# Patient Record
Sex: Male | Born: 1935 | Race: White | Hispanic: No | Smoking: Former smoker
Health system: Southern US, Community
[De-identification: ages and names within clinical notes are randomized; demographics above are authoritative.]

## PROBLEM LIST (undated history)

## (undated) DIAGNOSIS — Z860100 Personal history of colon polyps, unspecified: Secondary | ICD-10-CM

## (undated) DIAGNOSIS — Z8719 Personal history of other diseases of the digestive system: Secondary | ICD-10-CM

## (undated) DIAGNOSIS — B029 Zoster without complications: Secondary | ICD-10-CM

## (undated) DIAGNOSIS — C3490 Malignant neoplasm of unspecified part of unspecified bronchus or lung: Secondary | ICD-10-CM

## (undated) DIAGNOSIS — M720 Palmar fascial fibromatosis [Dupuytren]: Secondary | ICD-10-CM

## (undated) DIAGNOSIS — N400 Enlarged prostate without lower urinary tract symptoms: Secondary | ICD-10-CM

## (undated) DIAGNOSIS — E785 Hyperlipidemia, unspecified: Secondary | ICD-10-CM

## (undated) DIAGNOSIS — L508 Other urticaria: Secondary | ICD-10-CM

## (undated) DIAGNOSIS — L501 Idiopathic urticaria: Secondary | ICD-10-CM

## (undated) DIAGNOSIS — K862 Cyst of pancreas: Secondary | ICD-10-CM

## (undated) DIAGNOSIS — D649 Anemia, unspecified: Secondary | ICD-10-CM

## (undated) DIAGNOSIS — I2510 Atherosclerotic heart disease of native coronary artery without angina pectoris: Secondary | ICD-10-CM

## (undated) DIAGNOSIS — K5790 Diverticulosis of intestine, part unspecified, without perforation or abscess without bleeding: Secondary | ICD-10-CM

## (undated) HISTORY — PX: TONSILLECTOMY: SUR1361

## (undated) HISTORY — DX: Other urticaria: L50.8

## (undated) HISTORY — DX: Cyst of pancreas: K86.2

## (undated) HISTORY — DX: Atherosclerotic heart disease of native coronary artery without angina pectoris: I25.10

## (undated) HISTORY — DX: Personal history of colon polyps, unspecified: Z86.0100

## (undated) HISTORY — DX: Personal history of colonic polyps: Z86.010

---

## 2001-12-14 HISTORY — PX: COLONOSCOPY: SHX174

## 2007-01-29 ENCOUNTER — Ambulatory Visit: Payer: Self-pay | Admitting: Gastroenterology

## 2010-03-26 ENCOUNTER — Ambulatory Visit: Payer: Self-pay | Admitting: Gastroenterology

## 2010-03-26 HISTORY — PX: COLONOSCOPY: SHX174

## 2010-03-28 LAB — PATHOLOGY REPORT

## 2013-05-25 ENCOUNTER — Ambulatory Visit: Payer: Self-pay | Admitting: Gastroenterology

## 2013-05-25 HISTORY — PX: COLONOSCOPY: SHX174

## 2015-05-29 DIAGNOSIS — M720 Palmar fascial fibromatosis [Dupuytren]: Secondary | ICD-10-CM | POA: Insufficient documentation

## 2015-06-29 ENCOUNTER — Other Ambulatory Visit: Payer: Self-pay | Admitting: Internal Medicine

## 2015-06-29 DIAGNOSIS — R0600 Dyspnea, unspecified: Secondary | ICD-10-CM

## 2015-07-03 ENCOUNTER — Ambulatory Visit
Admission: RE | Admit: 2015-07-03 | Discharge: 2015-07-03 | Disposition: A | Discharge status: Home / Self Care | Payer: Commercial Managed Care - HMO | Source: Ambulatory Visit | Attending: Internal Medicine | Admitting: Internal Medicine

## 2015-07-03 DIAGNOSIS — J439 Emphysema, unspecified: Secondary | ICD-10-CM | POA: Insufficient documentation

## 2015-07-03 DIAGNOSIS — I2510 Atherosclerotic heart disease of native coronary artery without angina pectoris: Secondary | ICD-10-CM | POA: Insufficient documentation

## 2015-07-03 DIAGNOSIS — R0600 Dyspnea, unspecified: Secondary | ICD-10-CM

## 2015-07-03 DIAGNOSIS — K449 Diaphragmatic hernia without obstruction or gangrene: Secondary | ICD-10-CM | POA: Diagnosis not present

## 2015-07-03 DIAGNOSIS — J84112 Idiopathic pulmonary fibrosis: Secondary | ICD-10-CM | POA: Diagnosis not present

## 2015-07-03 MED ORDER — IOHEXOL 350 MG/ML SOLN
100.0000 mL | Freq: Once | INTRAVENOUS | Status: AC | PRN
  Administered 2015-07-03: 85 mL via INTRAVENOUS

## 2015-07-05 ENCOUNTER — Other Ambulatory Visit: Payer: Self-pay | Admitting: Internal Medicine

## 2015-07-05 DIAGNOSIS — R599 Enlarged lymph nodes, unspecified: Secondary | ICD-10-CM

## 2015-07-05 DIAGNOSIS — R591 Generalized enlarged lymph nodes: Secondary | ICD-10-CM

## 2015-11-03 ENCOUNTER — Ambulatory Visit
Admission: RE | Admit: 2015-11-03 | Discharge: 2015-11-03 | Disposition: A | Discharge status: Home / Self Care | Payer: Commercial Managed Care - HMO | Source: Ambulatory Visit | Attending: Internal Medicine | Admitting: Internal Medicine

## 2015-11-03 DIAGNOSIS — Z9889 Other specified postprocedural states: Secondary | ICD-10-CM | POA: Insufficient documentation

## 2015-11-03 DIAGNOSIS — R591 Generalized enlarged lymph nodes: Secondary | ICD-10-CM | POA: Insufficient documentation

## 2015-11-03 DIAGNOSIS — J449 Chronic obstructive pulmonary disease, unspecified: Secondary | ICD-10-CM | POA: Insufficient documentation

## 2015-11-03 DIAGNOSIS — J8410 Pulmonary fibrosis, unspecified: Secondary | ICD-10-CM | POA: Diagnosis not present

## 2015-11-03 DIAGNOSIS — R599 Enlarged lymph nodes, unspecified: Secondary | ICD-10-CM

## 2015-11-03 LAB — POCT I-STAT CREATININE: CREATININE: 1 mg/dL (ref 0.61–1.24)

## 2015-11-03 MED ORDER — IOHEXOL 300 MG/ML  SOLN
75.0000 mL | Freq: Once | INTRAMUSCULAR | Status: AC | PRN
  Administered 2015-11-03: 75 mL via INTRAVENOUS

## 2015-12-21 ENCOUNTER — Ambulatory Visit
Admission: RE | Admit: 2015-12-21 | Discharge: 2015-12-21 | Disposition: A | Discharge status: Home / Self Care | Payer: Commercial Managed Care - HMO | Source: Ambulatory Visit | Attending: Internal Medicine | Admitting: Internal Medicine

## 2015-12-21 ENCOUNTER — Other Ambulatory Visit: Payer: Self-pay | Admitting: Internal Medicine

## 2015-12-21 DIAGNOSIS — J8410 Pulmonary fibrosis, unspecified: Secondary | ICD-10-CM | POA: Insufficient documentation

## 2015-12-21 DIAGNOSIS — I2510 Atherosclerotic heart disease of native coronary artery without angina pectoris: Secondary | ICD-10-CM | POA: Diagnosis not present

## 2015-12-21 DIAGNOSIS — J479 Bronchiectasis, uncomplicated: Secondary | ICD-10-CM | POA: Diagnosis not present

## 2015-12-21 DIAGNOSIS — R109 Unspecified abdominal pain: Secondary | ICD-10-CM | POA: Diagnosis present

## 2015-12-21 DIAGNOSIS — I700 Atherosclerosis of aorta: Secondary | ICD-10-CM | POA: Diagnosis not present

## 2015-12-21 DIAGNOSIS — K5790 Diverticulosis of intestine, part unspecified, without perforation or abscess without bleeding: Secondary | ICD-10-CM | POA: Diagnosis not present

## 2015-12-21 DIAGNOSIS — K862 Cyst of pancreas: Secondary | ICD-10-CM | POA: Insufficient documentation

## 2015-12-21 DIAGNOSIS — K449 Diaphragmatic hernia without obstruction or gangrene: Secondary | ICD-10-CM | POA: Diagnosis not present

## 2015-12-21 DIAGNOSIS — J439 Emphysema, unspecified: Secondary | ICD-10-CM | POA: Diagnosis not present

## 2015-12-21 DIAGNOSIS — R1084 Generalized abdominal pain: Secondary | ICD-10-CM | POA: Insufficient documentation

## 2015-12-21 LAB — POCT I-STAT CREATININE: Creatinine, Ser: 1 mg/dL (ref 0.61–1.24)

## 2015-12-21 MED ORDER — IOPAMIDOL (ISOVUE-300) INJECTION 61%
100.0000 mL | Freq: Once | INTRAVENOUS | Status: AC | PRN
  Administered 2015-12-21: 100 mL via INTRAVENOUS

## 2016-01-03 ENCOUNTER — Other Ambulatory Visit: Payer: Self-pay | Admitting: Gastroenterology

## 2016-01-03 DIAGNOSIS — R109 Unspecified abdominal pain: Secondary | ICD-10-CM

## 2016-01-03 DIAGNOSIS — K869 Disease of pancreas, unspecified: Secondary | ICD-10-CM

## 2016-01-22 ENCOUNTER — Ambulatory Visit: Payer: Commercial Managed Care - HMO

## 2016-01-24 ENCOUNTER — Ambulatory Visit: Payer: Commercial Managed Care - HMO

## 2016-02-05 DIAGNOSIS — E538 Deficiency of other specified B group vitamins: Secondary | ICD-10-CM | POA: Insufficient documentation

## 2016-02-09 ENCOUNTER — Ambulatory Visit
Admission: RE | Admit: 2016-02-09 | Discharge: 2016-02-09 | Disposition: A | Discharge status: Home / Self Care | Payer: Commercial Managed Care - HMO | Source: Ambulatory Visit | Attending: Gastroenterology | Admitting: Gastroenterology

## 2016-02-09 DIAGNOSIS — K449 Diaphragmatic hernia without obstruction or gangrene: Secondary | ICD-10-CM | POA: Diagnosis not present

## 2016-02-09 DIAGNOSIS — R109 Unspecified abdominal pain: Secondary | ICD-10-CM

## 2016-02-09 DIAGNOSIS — K869 Disease of pancreas, unspecified: Secondary | ICD-10-CM | POA: Diagnosis not present

## 2016-02-09 DIAGNOSIS — N281 Cyst of kidney, acquired: Secondary | ICD-10-CM | POA: Insufficient documentation

## 2016-02-09 MED ORDER — GADOBENATE DIMEGLUMINE 529 MG/ML IV SOLN
20.0000 mL | Freq: Once | INTRAVENOUS | Status: AC | PRN
  Administered 2016-02-09: 16 mL via INTRAVENOUS

## 2016-02-15 ENCOUNTER — Telehealth: Payer: Self-pay

## 2016-02-15 NOTE — Telephone Encounter (Signed)
  Oncology Nurse Navigator Documentation  Navigator Location: [facility] (02/15/16 1700) Navigator Encounter Type: Introductory phone call;Telephone;Letter/Fax/Email (02/15/16 1700) Telephone: Outgoing Call (02/15/16 1700)             Barriers/Navigation Needs: Coordination of Care (02/15/16 1700)   Interventions: Coordination of Care (02/15/16 1700)   Coordination of Care: EUS (02/15/16 1700)        Acuity: Level 2 (02/15/16 1700)   Acuity Level 2: Initial guidance, education and coordination as needed;Educational needs;Assistance expediting appointments;Ongoing guidance and education throughout treatment as needed (02/15/16 1700)     Time Spent with Patient: 30 (02/15/16 1700)   Received referral from Wynona Dove NP at [facility] for EUS of pancreatic cystic lesion. Scheduled for 02/22/16 with Dr Earlie Counts at [facility]. Spoke with patient on the phone. Denies diabetes or anticoagulation medication other than 1/2 asa daily. Went over instructions and copy also mailed to home address.  INSTRUCTIONS FOR ENDOSCOPIC ULTRASOUND -Your procedure has been scheduled for June 8th with Dr Earlie Counts at [facility] -The hospital will contact you to pre-register over the phone. If for any reason you have not received a call within one week prior to your scheduled procedure date, please call [phone]. -To get your scheduled arrival time, please call the Endoscopy unit at  [phone] between 1-3pm on: June 7th    -ON THE DAY OF YOU PROCEDURE:   1. If you are scheduled for a morning procedure, nothing to drink after midnight  -If you are scheduled for an afternoon procedure, you may have clear liquids until 5 hours prior  to the procedure but no carbonated drinks or broth  2. NO FOOD THE DAY OF YOUR PROCEDURE  3. You may take your heart, seizure, blood pressure, Parkinson's or breathing medications at  6am with just enough water to get your pills down  4. Do not take any oral Diabetic medications the  morning of your procedure.  5. If you are a diabetic and are using insulin, please notify your prescribing physician of this  procedure as your dose may need to be altered related to not being able to eat or drink.   5. Do not take Vitamins     -On the day of your procedure, come to the [facility] Admitting/Registration desk (First desk on the right) at the scheduled arrival time. You MUST have someone drive you home from your procedure. You must have a responsible adult with a valid drivers license who is on site throughout your entire procedure and who can stay with you for several hours after your procedure. You may not go home alone in a taxi, shuttle [place] or bus, as the drivers will not be responsible for you.  --If you have any questions please call me at the above contact

## 2016-02-22 ENCOUNTER — Encounter
Admission: RE | Disposition: A | Discharge status: Home / Self Care | Payer: Self-pay | Source: Ambulatory Visit | Attending: Gastroenterology

## 2016-02-22 ENCOUNTER — Ambulatory Visit
Admission: RE | Admit: 2016-02-22 | Discharge: 2016-02-22 | Disposition: A | Discharge status: Home / Self Care | Payer: Commercial Managed Care - HMO | Source: Ambulatory Visit | Attending: Gastroenterology | Admitting: Gastroenterology

## 2016-02-22 ENCOUNTER — Ambulatory Visit: Payer: Commercial Managed Care - HMO | Admitting: Anesthesiology

## 2016-02-22 ENCOUNTER — Encounter: Payer: Self-pay | Admitting: *Deleted

## 2016-02-22 ENCOUNTER — Telehealth: Payer: Self-pay

## 2016-02-22 DIAGNOSIS — K449 Diaphragmatic hernia without obstruction or gangrene: Secondary | ICD-10-CM | POA: Diagnosis not present

## 2016-02-22 DIAGNOSIS — Z7982 Long term (current) use of aspirin: Secondary | ICD-10-CM | POA: Diagnosis not present

## 2016-02-22 DIAGNOSIS — E785 Hyperlipidemia, unspecified: Secondary | ICD-10-CM | POA: Insufficient documentation

## 2016-02-22 DIAGNOSIS — Z9889 Other specified postprocedural states: Secondary | ICD-10-CM | POA: Insufficient documentation

## 2016-02-22 DIAGNOSIS — L501 Idiopathic urticaria: Secondary | ICD-10-CM | POA: Diagnosis not present

## 2016-02-22 DIAGNOSIS — R933 Abnormal findings on diagnostic imaging of other parts of digestive tract: Secondary | ICD-10-CM | POA: Insufficient documentation

## 2016-02-22 DIAGNOSIS — Z87891 Personal history of nicotine dependence: Secondary | ICD-10-CM | POA: Diagnosis not present

## 2016-02-22 DIAGNOSIS — K862 Cyst of pancreas: Secondary | ICD-10-CM | POA: Insufficient documentation

## 2016-02-22 DIAGNOSIS — N400 Enlarged prostate without lower urinary tract symptoms: Secondary | ICD-10-CM | POA: Insufficient documentation

## 2016-02-22 HISTORY — DX: Zoster without complications: B02.9

## 2016-02-22 HISTORY — DX: Palmar fascial fibromatosis (dupuytren): M72.0

## 2016-02-22 HISTORY — DX: Idiopathic urticaria: L50.1

## 2016-02-22 HISTORY — PX: EUS: SHX5427

## 2016-02-22 HISTORY — DX: Diverticulosis of intestine, part unspecified, without perforation or abscess without bleeding: K57.90

## 2016-02-22 HISTORY — DX: Benign prostatic hyperplasia without lower urinary tract symptoms: N40.0

## 2016-02-22 HISTORY — DX: Anemia, unspecified: D64.9

## 2016-02-22 HISTORY — DX: Hyperlipidemia, unspecified: E78.5

## 2016-02-22 SURGERY — UPPER ENDOSCOPIC ULTRASOUND (EUS) LINEAR
Anesthesia: General

## 2016-02-22 MED ORDER — PROPOFOL 10 MG/ML IV BOLUS
INTRAVENOUS | Status: DC | PRN
  Administered 2016-02-22: 30 mg via INTRAVENOUS

## 2016-02-22 MED ORDER — SODIUM CHLORIDE 0.9 % IV SOLN
INTRAVENOUS | Status: DC
  Administered 2016-02-22: 13:00:00 via INTRAVENOUS

## 2016-02-22 MED ORDER — PROPOFOL 500 MG/50ML IV EMUL
INTRAVENOUS | Status: DC | PRN
  Administered 2016-02-22: 160 ug/kg/min via INTRAVENOUS

## 2016-02-22 MED ORDER — PHENYLEPHRINE HCL 10 MG/ML IJ SOLN
INTRAMUSCULAR | Status: DC | PRN
  Administered 2016-02-22: 100 ug via INTRAVENOUS
  Administered 2016-02-22 (×2): 150 ug via INTRAVENOUS
  Administered 2016-02-22: 50 ug via INTRAVENOUS
  Administered 2016-02-22: 100 ug via INTRAVENOUS

## 2016-02-22 MED ORDER — LIDOCAINE HCL (CARDIAC) 20 MG/ML IV SOLN
INTRAVENOUS | Status: DC | PRN
Start: 2016-02-22 — End: 2016-02-22
  Administered 2016-02-22: 60 mg via INTRAVENOUS

## 2016-02-22 NOTE — Anesthesia Preprocedure Evaluation (Addendum)
Anesthesia Evaluation  Patient identified by MRN, date of birth, ID band Patient awake    Reviewed: Allergy & Precautions, H&P , NPO status , Patient's Chart, lab work & pertinent test results, reviewed documented beta blocker date and time   Airway Mallampati: II   Neck ROM: full    Dental  (+) Teeth Intact   Pulmonary neg pulmonary ROS, former smoker,    Pulmonary exam normal        Cardiovascular negative cardio ROS Normal cardiovascular exam Rate:Normal     Neuro/Psych negative neurological ROS  negative psych ROS   GI/Hepatic negative GI ROS, Neg liver ROS,   Endo/Other  negative endocrine ROS  Renal/GU negative Renal ROS  negative genitourinary   Musculoskeletal   Abdominal   Peds  Hematology negative hematology ROS (+) anemia ,   Anesthesia Other Findings Past Medical History:   Anemia                                                       Chronic idiopathic urticaria                                 Diverticulosis                                               BPH (benign prostatic hyperplasia)                           Dupuytren's contracture of right hand                        Shingles                                                     Hyperlipemia                                               Past Surgical History:   TONSILLECTOMY                                                 COLONOSCOPY                                                   Reproductive/Obstetrics                            Anesthesia Physical Anesthesia Plan  ASA: II  Anesthesia Plan: General   Post-op Pain Management:    Induction:  Airway Management Planned:   Additional Equipment:   Intra-op Plan:   Post-operative Plan:   Informed Consent: I have reviewed the patients History and Physical, chart, labs and discussed the procedure including the risks, benefits and alternatives for the proposed  anesthesia with the patient or authorized representative who has indicated his/her understanding and acceptance.   Dental Advisory Given  Plan Discussed with: CRNA  Anesthesia Plan Comments:        Anesthesia Quick Evaluation

## 2016-02-22 NOTE — Op Note (Signed)
[facility] Gastroenterology Patient Name: Noah Coleman Procedure Date: 02/22/2016 12:48 PM MRN: [id number] Account #: [id number] Date of Birth: [birth year] Admit Type: Outpatient Age: 82 Room: [facility] ENDO ROOM 3 Gender: Male Note Status: Finalized Procedure:            Upper EUS Indications:          Pancreatic cyst on CT scan Providers:            Christian Mate. Earlie Counts, MD Referring MD:         Rusty Aus, MD (Referring MD), Lollie Sails,                        MD (Referring MD) Medicines:            Monitored Anesthesia Care Complications:        No immediate complications. Procedure:            Pre-Anesthesia Assessment:                       - Prior to the procedure, a History and Physical was                        performed, and patient medications, allergies and                        sensitivities were reviewed. The patient's tolerance of                        previous anesthesia was reviewed.                       After obtaining informed consent, the endoscope was                        passed under direct vision. Throughout the procedure,                        the patient's blood pressure, pulse, and oxygen                        saturations were monitored continuously. The                        duodenoscope and subsequently the EUS GI Linear Array                        [id number] was introduced through the mouth, and advanced                        to the second part of duodenum. The upper EUS was                        accomplished without difficulty. The patient tolerated                        the procedure well. Findings:      Endoscopic Finding :      Detailed esophageal exam not performed (side-vieweing instrument).      A medium-sized hiatal hernia was present.      The entire examined stomach was endoscopically normal.  The examined duodenum was endoscopically normal.      The ampulla was normal.      Endosonographic Finding :   An anechoic lesion suggestive of a cyst was identified in the uncinate       process of the pancreas. It communicates with the pancreatic duct by a       thin side branch. The lesion measured 14.6 mm by 12.9 mm in maximal       cross-sectional diameter. There was a single compartment thinly       septated. The outer wall of the lesion was thin. There was no associated       mass. There was no internal debris within the fluid-filled cavity.      The pancreatic duct had a dilated endosonographic appearance in the       pancreatic head. The pancreatic duct measured up to 6.3 mm in diameter       in the head of the pancreas. It tapered to normal size distally: 2.4 mm       in the neck, 1.8 mm in the body, and very thin and poorly visualized in       the tail.      Anechoic lesions suggestive of two cysts were identified in the       pancreatic body and in the pancreatic tail. The largest lesion measured       3.8 mm by 3.8 mm in maximal cross-sectional diameter. There was no       associated mass. No obvious communication was seen between these       diminutive cysts and the pancreatic duct.      Endosonographic imaging in the entire pancreas showed no chronic       pancreatitis and no mass.      Endosonographic imaging in the left lobe of the liver showed no mass.      No abnormal-appearing lymph nodes were seen during endosonographic       examination in the celiac region (level 20). Impression:           - A cystic lesion was seen in the uncinate process of                        the pancreas. Tissue has not been obtained, given its                        small size and lack of worrisome features. 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                       - The pancreatic duct had a dilated endosonographic                        appearance in the pancreatic head.  The pancreatic duct                        measured up to 6.3 mm in diameter in the head.                       -  Two diminutive cystic lesions were seen in the                        pancreatic body and in the pancreatic tail.                       - Medium-sized hiatal hernia.                       - Normal ampulla.                       - No specimens collected. Recommendation:       - Return to referring physician.                       - Perform CT scan (computed tomography)/MRI of the                        abdomen with contrast in 1 year.                       - Patient has a contact number available for                        emergencies. The signs and symptoms of potential                        delayed complications were discussed with the patient.                        Return to normal activities tomorrow. Written discharge                        instructions were provided to the patient. Procedure Code(s):    --- Professional ---                       [phone], Esophagogastroduodenoscopy, flexible, transoral;                        with endoscopic ultrasound examination limited to the                        esophagus, stomach or duodenum, and adjacent structures Diagnosis Code(s):    --- Professional ---                       K86.2, Cyst of pancreas                       R93.3, Abnormal findings on diagnostic imaging of other                        parts of digestive tract                       K44.9, Diaphragmatic hernia without obstruction or                        gangrene CPT copyright 2016 American Medical Association. All rights reserved. The codes documented in this report are preliminary and upon coder review may  be revised to meet current  compliance requirements. Attending Participation:      I personally performed the entire procedure without the assistance of a       fellow, resident or surgical assistant. Lindie Spruce, MD 02/22/2016 1:48:31 PM This report has  been signed electronically. Number of Addenda: 0 Note Initiated On: 02/22/2016 12:48 PM Estimated Blood Loss: Estimated blood loss: none.      [facility]

## 2016-02-22 NOTE — Transfer of Care (Signed)
Immediate Anesthesia Transfer of Care Note  Patient: Noah Coleman  Procedure(s) Performed: Procedure(s): UPPER ENDOSCOPIC ULTRASOUND (EUS) LINEAR (N/A)  Patient Location: PACU  Anesthesia Type:General  Level of Consciousness: awake and patient cooperative  Airway & Oxygen Therapy: Patient Spontanous Breathing and Patient connected to nasal cannula oxygen  Post-op Assessment: Report given to RN  Post vital signs: Reviewed and stable  Last Vitals:  Filed Vitals:   02/22/16 1221 02/22/16 1341  BP: 149/68 91/48  Pulse: 72 70  Temp: 36.8 C 36.1 C  Resp: 16 19    Last Pain: There were no vitals filed for this visit.       Complications: No apparent anesthesia complications

## 2016-02-22 NOTE — Telephone Encounter (Signed)
  Oncology Nurse Navigator Documentation  Navigator Location: [facility] (02/22/16 1500)                         Coordination of Care: EUS (02/22/16 1500)                  Time Spent with Patient: 15 (02/22/16 1500)   Copy of EUS routed to Dr Marton Redwood office.

## 2016-02-22 NOTE — H&P (Signed)
Primary Care Physician:  Rusty Aus, MD  Pre-Procedure History & Physical: HPI:  Noah Coleman is a 82 y.o. male is here for an Upper EUS to evaluate and incidentally noted pancreatic cystic lesion.   Past Medical History  Diagnosis Date  . Anemia   . Chronic idiopathic urticaria   . Diverticulosis   . BPH (benign prostatic hyperplasia)   . Dupuytren's contracture of right hand   . Shingles   . Hyperlipemia     Past Surgical History  Procedure Laterality Date  . Tonsillectomy    . Colonoscopy      Prior to Admission medications   Medication Sig Start Date End Date Taking? Authorizing Provider  aspirin 81 MG tablet Take 81 mg by mouth daily.   Yes Historical Provider, MD  fexofenadine (ALLEGRA) 180 MG tablet Take 180 mg by mouth daily.   Yes Historical Provider, MD  Multiple Vitamin (MULTIVITAMIN) capsule Take 1 capsule by mouth daily.   Yes Historical Provider, MD    Allergies as of 02/19/2016  . (No Known Allergies)    History reviewed. No pertinent family history.  Social History   Social History  . Marital Status: Married    Spouse Name: N/A  . Number of Children: N/A  . Years of Education: N/A   Occupational History  . Not on file.   Social History Main Topics  . Smoking status: Former Research scientist (life sciences)  . Smokeless tobacco: Not on file  . Alcohol Use: No  . Drug Use: No  . Sexual Activity: Not on file   Other Topics Concern  . Not on file   Social History Narrative    Review of Systems: See HPI, otherwise negative ROS  Physical Exam: BP 149/68 mmHg  Pulse 72  Temp(Src) 98.2 F (36.8 C) (Tympanic)  Resp 16  Ht 6' (1.829 m)  Wt 77.111 kg (170 lb)  BMI 23.05 kg/m2  SpO2 100% General:   Alert,  pleasant and cooperative in NAD Head:  Normocephalic and atraumatic. Neck:  Supple; no masses or thyromegaly. Lungs:  Clear throughout to auscultation.    Heart:  Regular rate and rhythm. Abdomen:  Soft, nontender and nondistended. Normal bowel sounds,  without guarding, and without rebound.   Neurologic:  Alert and  oriented x4;  grossly normal neurologically.  Impression/Plan: Noah Coleman is here for an Upper EUS to be performed for pancreatic cyst.  Risks, benefits, limitations, and alternatives regarding  Upper EUS have been reviewed with the patient.  Questions have been answered.  All parties agreeable.   Cora Daniels, MD  02/22/2016, 1:01 PM

## 2016-02-23 ENCOUNTER — Encounter: Payer: Self-pay | Admitting: Gastroenterology

## 2016-03-05 NOTE — Anesthesia Postprocedure Evaluation (Signed)
Anesthesia Post Note  Patient: Noah Coleman  Procedure(s) Performed: Procedure(s) (LRB): UPPER ENDOSCOPIC ULTRASOUND (EUS) LINEAR (N/A)  Patient location during evaluation: PACU Anesthesia Type: General Level of consciousness: awake and alert Pain management: pain level controlled Vital Signs Assessment: post-procedure vital signs reviewed and stable Respiratory status: spontaneous breathing, nonlabored ventilation, respiratory function stable and patient connected to nasal cannula oxygen Cardiovascular status: blood pressure returned to baseline and stable Postop Assessment: no signs of nausea or vomiting Anesthetic complications: no    Last Vitals:  Filed Vitals:   02/22/16 1400 02/22/16 1410  BP: 138/61 119/71  Pulse: 67 68  Temp:    Resp: 16 15    Last Pain:  Filed Vitals:   02/23/16 0752  PainSc: 0-No pain                 Molli Barrows

## 2016-05-30 DIAGNOSIS — L508 Other urticaria: Secondary | ICD-10-CM | POA: Insufficient documentation

## 2016-05-30 DIAGNOSIS — K862 Cyst of pancreas: Secondary | ICD-10-CM | POA: Insufficient documentation

## 2016-05-30 HISTORY — DX: Cyst of pancreas: K86.2

## 2016-05-30 HISTORY — DX: Other urticaria: L50.8

## 2016-07-30 ENCOUNTER — Other Ambulatory Visit: Payer: Self-pay | Admitting: Internal Medicine

## 2016-07-30 DIAGNOSIS — I208 Other forms of angina pectoris: Secondary | ICD-10-CM

## 2016-07-30 DIAGNOSIS — R55 Syncope and collapse: Secondary | ICD-10-CM

## 2016-08-02 ENCOUNTER — Ambulatory Visit
Admission: RE | Admit: 2016-08-02 | Discharge: 2016-08-02 | Disposition: A | Discharge status: Home / Self Care | Payer: Commercial Managed Care - HMO | Source: Ambulatory Visit | Attending: Internal Medicine | Admitting: Internal Medicine

## 2016-08-02 DIAGNOSIS — I208 Other forms of angina pectoris: Secondary | ICD-10-CM | POA: Insufficient documentation

## 2016-08-02 DIAGNOSIS — I6523 Occlusion and stenosis of bilateral carotid arteries: Secondary | ICD-10-CM | POA: Insufficient documentation

## 2016-08-02 DIAGNOSIS — R55 Syncope and collapse: Secondary | ICD-10-CM | POA: Insufficient documentation

## 2016-08-06 ENCOUNTER — Other Ambulatory Visit: Payer: Self-pay | Admitting: Internal Medicine

## 2016-08-06 DIAGNOSIS — I6521 Occlusion and stenosis of right carotid artery: Secondary | ICD-10-CM

## 2016-08-07 ENCOUNTER — Other Ambulatory Visit: Payer: Self-pay | Admitting: Cardiology

## 2016-08-07 DIAGNOSIS — R079 Chest pain, unspecified: Secondary | ICD-10-CM | POA: Insufficient documentation

## 2016-08-07 MED ORDER — SODIUM CHLORIDE 0.9 % WEIGHT BASED INFUSION: 3.0000 mL/kg/h | INTRAVENOUS | Status: AC

## 2016-08-07 MED ORDER — SODIUM CHLORIDE 0.9 % IV SOLN: 250.0000 mL | INTRAVENOUS | Status: AC | PRN

## 2016-08-07 MED ORDER — SODIUM CHLORIDE 0.9 % WEIGHT BASED INFUSION: 1.0000 mL/kg/h | INTRAVENOUS | Status: AC

## 2016-08-07 MED ORDER — ASPIRIN 81 MG PO CHEW: 81.0000 mg | CHEWABLE_TABLET | ORAL | Status: AC

## 2016-08-07 MED ORDER — SODIUM CHLORIDE 0.9% FLUSH: 3.0000 mL | INTRAVENOUS | Status: AC | PRN

## 2016-08-07 MED ORDER — SODIUM CHLORIDE 0.9% FLUSH: 3.0000 mL | Freq: Two times a day (BID) | INTRAVENOUS | Status: AC

## 2016-08-12 ENCOUNTER — Encounter: Payer: Self-pay | Admitting: *Deleted

## 2016-08-12 ENCOUNTER — Other Ambulatory Visit: Payer: Self-pay | Admitting: Cardiology

## 2016-08-12 ENCOUNTER — Encounter
Admission: RE | Disposition: A | Discharge status: Home / Self Care | Payer: Self-pay | Source: Ambulatory Visit | Attending: Cardiology

## 2016-08-12 ENCOUNTER — Ambulatory Visit
Admission: RE | Admit: 2016-08-12 | Discharge: 2016-08-12 | Disposition: A | Discharge status: Home / Self Care | Payer: Commercial Managed Care - HMO | Source: Ambulatory Visit | Attending: Cardiology | Admitting: Cardiology

## 2016-08-12 DIAGNOSIS — R9439 Abnormal result of other cardiovascular function study: Secondary | ICD-10-CM | POA: Insufficient documentation

## 2016-08-12 DIAGNOSIS — I2582 Chronic total occlusion of coronary artery: Secondary | ICD-10-CM | POA: Diagnosis not present

## 2016-08-12 DIAGNOSIS — Z79899 Other long term (current) drug therapy: Secondary | ICD-10-CM | POA: Diagnosis not present

## 2016-08-12 DIAGNOSIS — R0789 Other chest pain: Secondary | ICD-10-CM | POA: Diagnosis not present

## 2016-08-12 DIAGNOSIS — Z7982 Long term (current) use of aspirin: Secondary | ICD-10-CM | POA: Diagnosis not present

## 2016-08-12 DIAGNOSIS — I2584 Coronary atherosclerosis due to calcified coronary lesion: Secondary | ICD-10-CM | POA: Insufficient documentation

## 2016-08-12 DIAGNOSIS — I2510 Atherosclerotic heart disease of native coronary artery without angina pectoris: Secondary | ICD-10-CM | POA: Insufficient documentation

## 2016-08-12 DIAGNOSIS — Z87891 Personal history of nicotine dependence: Secondary | ICD-10-CM | POA: Diagnosis not present

## 2016-08-12 HISTORY — PX: CARDIAC CATHETERIZATION: SHX172

## 2016-08-12 SURGERY — LEFT HEART CATH AND CORONARY ANGIOGRAPHY
Anesthesia: Moderate Sedation | Laterality: Left

## 2016-08-12 MED ORDER — SODIUM CHLORIDE 0.9% FLUSH
3.0000 mL | Freq: Two times a day (BID) | INTRAVENOUS | Status: AC
Start: 2016-08-12 — End: ?

## 2016-08-12 MED ORDER — SODIUM CHLORIDE 0.9 % IV SOLN: 250.0000 mL | INTRAVENOUS | Status: AC | PRN

## 2016-08-12 MED ORDER — HEPARIN (PORCINE) IN NACL 2-0.9 UNIT/ML-% IJ SOLN
INTRAMUSCULAR | Status: AC
Start: 2016-08-12 — End: 2016-08-12
  Filled 2016-08-12: qty 500

## 2016-08-12 MED ORDER — FENTANYL CITRATE (PF) 100 MCG/2ML IJ SOLN
INTRAMUSCULAR | Status: DC | PRN
  Administered 2016-08-12: 25 ug via INTRAVENOUS

## 2016-08-12 MED ORDER — CLOPIDOGREL BISULFATE 75 MG PO TABS: 75.0000 mg | ORAL_TABLET | Freq: Every day | ORAL | 6 refills | Status: DC

## 2016-08-12 MED ORDER — METOPROLOL SUCCINATE ER 25 MG PO TB24: 25.0000 mg | ORAL_TABLET | Freq: Every day | ORAL | 0 refills | Status: DC

## 2016-08-12 MED ORDER — MIDAZOLAM HCL 2 MG/2ML IJ SOLN
INTRAMUSCULAR | Status: AC
  Filled 2016-08-12: qty 2

## 2016-08-12 MED ORDER — MIDAZOLAM HCL 2 MG/2ML IJ SOLN
INTRAMUSCULAR | Status: DC | PRN
  Administered 2016-08-12 (×2): 0.5 mg via INTRAVENOUS

## 2016-08-12 MED ORDER — ASPIRIN 81 MG PO CHEW: 81.0000 mg | CHEWABLE_TABLET | ORAL | Status: AC

## 2016-08-12 MED ORDER — SODIUM CHLORIDE 0.9 % WEIGHT BASED INFUSION: 1.0000 mL/kg/h | INTRAVENOUS | Status: AC

## 2016-08-12 MED ORDER — SODIUM CHLORIDE 0.9% FLUSH: 3.0000 mL | INTRAVENOUS | Status: AC | PRN

## 2016-08-12 MED ORDER — SODIUM CHLORIDE 0.9 % WEIGHT BASED INFUSION: 3.0000 mL/kg/h | INTRAVENOUS | Status: AC

## 2016-08-12 MED ORDER — FENTANYL CITRATE (PF) 100 MCG/2ML IJ SOLN
INTRAMUSCULAR | Status: AC
  Filled 2016-08-12: qty 2

## 2016-08-12 SURGICAL SUPPLY — 9 items
CATH 5FR JL4 DIAGNOSTIC (CATHETERS) ×3 IMPLANT
CATH 5FR PIGTAIL DIAGNOSTIC (CATHETERS) ×2 IMPLANT
CATH INFINITI JR4 5F (CATHETERS) ×3 IMPLANT
DEVICE CLOSURE MYNXGRIP 5F (Vascular Products) ×3 IMPLANT
KIT MANI 3VAL PERCEP (MISCELLANEOUS) ×3 IMPLANT
NEEDLE PERC 18GX7CM (NEEDLE) ×3 IMPLANT
PACK CARDIAC CATH (CUSTOM PROCEDURE TRAY) ×3 IMPLANT
SHEATH AVANTI 5FR X 11CM (SHEATH) ×3 IMPLANT
WIRE EMERALD 3MM-J .035X150CM (WIRE) ×3 IMPLANT

## 2016-08-12 NOTE — Discharge Instructions (Signed)
Angiogram, Care After °These instructions give you information about caring for yourself after your procedure. Your doctor may also give you more specific instructions. Call your doctor if you have any problems or questions after your procedure. °Follow these instructions at home: °· Take medicines only as told by your doctor. °· Follow your doctor's instructions about: °¨ Care of the area where the tube was inserted. °¨ Bandage (dressing) changes and removal. °· You may shower 24-48 hours after the procedure or as told by your doctor. °· Do not take baths, swim, or use a hot tub until your doctor approves. °· Every day, check the area where the tube was inserted. Watch for: °¨ Redness, swelling, or pain. °¨ Fluid, blood, or pus. °· Do not apply powder or lotion to the site. °· Do not lift anything that is heavier than 10 lb (4.5 kg) for 5 days or as told by your doctor. °· Ask your doctor when you can: °¨ Return to work or school. °¨ Do physical activities or play sports. °¨ Have sex. °· Do not drive or operate heavy machinery for 24 hours or as told by your doctor. °· Have someone with you for the first 24 hours after the procedure. °· Keep all follow-up visits as told by your doctor. This is important. °Contact a health care provider if: °· You have a fever. °· You have chills. °· You have more bleeding from the area where the tube was inserted. Hold pressure on the area. °· You have redness, swelling, or pain in the area where the tube was inserted. °· You have fluid or pus coming from the area. °Get help right away if: °· You have a lot of pain in the area where the tube was inserted. °· The area where the tube was inserted is bleeding, and the bleeding does not stop after 30 minutes of holding steady pressure on the area. °· The area near or just beyond the insertion site becomes pale, cool, tingly, or numb. °This information is not intended to replace advice given to you by your health care provider. Make  sure you discuss any questions you have with your health care provider. °Document Released: 11/29/2008 Document Revised: 02/08/2016 Document Reviewed: 02/03/2013 °Elsevier Interactive Patient Education © 2017 [employer] ° °

## 2016-08-12 NOTE — H&P (Signed)
Chief Complaint: Chief Complaint  Patient presents with  . Chest Pain  new pt per MFM- abdnormal stress echo last nite had chest discomfort for a few minutes and then went away nothing since  . Fatigue  Im a walker walks 2 miles a day one day my route is uphill and had a weak spell  . Extremity Weakness  had a spell last nite where they would not hardly hold me  Date of Service: 08/07/2016 Date of Birth: 12-Jun-1930 PCP: Rusty Aus, MD  History of Present Illness: Mr. Noah Coleman is a 82 y.o.male patient who is referred for an urgent visit after being found to have an abnormal stress echo yesterday per Dr. Rusty Aus which was felt to show anterior ischemia.. Patient has no prior cardiac history. He has no history of hypertension. He recently has been noting exertional leg weakness and fatigue. This happens on occasion. Does not happen with every time he ambulates. He walks 2 miles a day and normally is able to do fine with that. Last night he awoke from sleep and felt like his legs were weak. He is leg strength came back after approximately 10 minutes. He has had no further problems since that time. He denies syncope. He denies dizziness. He has a sensation of tightness in his chest. Carotid Dopplers revealed bilateral carotid disease less than 70%. He has a carotid CT scan pending. He denies orthopnea or PND. He denies tobacco abuse recently. Quit smoking in 1982. His EKG earlier this month showed sinus rhythm with no ischemia. His serum LDL is 113.  Past Medical and Surgical History  Past Medical History Past Medical History:  Diagnosis Date  . Chronic urticaria 05/30/2016  . Cyst of pancreas 05/30/2016  Seen by MRI, EUS 6/17 benign, recheck CT one year  . Diverticulosis  Colonoscopy 2003.  Marland Kitchen History of colon polyps  . Hyperlipidemia  . Shingles   Past Surgical History He has a past surgical history that includes Tonsillectomy; Colonoscopy (12/14/2001); Colonoscopy (03/26/2010);  Colonoscopy (05/25/2013); and UPPER EUS (02/22/2016).   Medications and Allergies  Current Medications  Current Outpatient Prescriptions  Medication Sig Dispense Refill  . aspirin 325 MG EC tablet Take 325 mg by mouth once daily.  Marland Kitchen BACILLUS COAGULANS (PROBIOTIC, B. COAGULANS, ORAL) Take by mouth once daily.  . cyanocobalamin (VITAMIN B12) 1000 MCG tablet Take 500 mcg by mouth once daily.  . fexofenadine (ALLEGRA) 180 MG tablet Take 180 mg by mouth once daily.  . multivitamin tablet Take 1 tablet by mouth once daily.  . metoprolol succinate (TOPROL-XL) 25 MG XL tablet Take 1 tablet (25 mg total) by mouth once daily. 30 tablet 11  . nitroGLYcerin (NITROSTAT) 0.4 MG SL tablet Place 1 tablet (0.4 mg total) under the tongue every 5 (five) minutes as needed for Chest pain. May take up to 3 doses. 25 tablet 11   No current facility-administered medications for this visit.   Allergies: Review of patient's allergies indicates no known allergies.  Social and Family History  Social History reports that he quit smoking about 35 years ago. His smoking use included Cigarettes. He has never used smokeless tobacco. He reports that he does not drink alcohol or use drugs.  Family History Family History  Problem Relation Age of Onset  . Diabetes type II Mother  . Ovarian cancer Mother  . Gallbladder disease Mother  . Colon cancer Father  . Lung cancer Father  . Lung cancer Brother   Review of  Systems  Review of Systems  Constitutional: Negative for chills, diaphoresis, fever, malaise/fatigue and weight loss.  HENT: Negative for congestion, ear discharge, hearing loss and tinnitus.  Eyes: Negative for blurred vision.  Respiratory: Negative for cough, hemoptysis, sputum production, shortness of breath and wheezing.  Cardiovascular: Positive for chest pain. Negative for palpitations, orthopnea, claudication, leg swelling and PND.  Gastrointestinal: Negative for abdominal pain, blood in stool,  constipation, diarrhea, heartburn, melena, nausea and vomiting.  Genitourinary: Negative for dysuria, frequency, hematuria and urgency.  Musculoskeletal: Negative for back pain, falls, joint pain and myalgias.  Skin: Negative for itching and rash.  Neurological: Negative for dizziness, tingling, focal weakness, loss of consciousness, weakness and headaches.  Endo/Heme/Allergies: Negative for polydipsia. Does not bruise/bleed easily.  Psychiatric/Behavioral: Negative for depression, memory loss and substance abuse. The patient is not nervous/anxious.   Physical Examination   Vitals:BP 148/80  Pulse 80  Resp 14  Ht 180.3 cm ('5\' 11"'$ )  Wt 80.6 kg (177 lb 9.6 oz)  BMI 24.77 kg/m  Ht:180.3 cm ('5\' 11"'$ ) Wt:80.6 kg (177 lb 9.6 oz) ZSW:FUXN surface area is 2.01 meters squared. Body mass index is 24.77 kg/m.  Wt Readings from Last 3 Encounters:  08/07/16 80.6 kg (177 lb 9.6 oz)  07/30/16 81.6 kg (180 lb)  07/15/16 81.2 kg (179 lb)   BP Readings from Last 3 Encounters:  08/07/16 148/80  07/30/16 152/74  07/15/16 132/72   General appearance appears in no acute distress  Head Mouth and Eye exam Normocephalic, without obvious abnormality, atraumatic Dentition is good Eyes appear anicteric   Neck exam Thyroid: normal  Nodes: no obvious adenopathy  LUNGS Breath Sounds: Normal Percussion: Normal  CARDIOVASCULAR JVP CV wave: no HJR: no Elevation at 90 degrees: None Carotid Pulse: normal pulsation bilaterally Bruit: Bilateral carotid bruits Apex: apical impulse normal  Auscultation Rhythm: normal sinus rhythm S1: normal S2: normal Clicks: no Rub: no Murmurs: no murmurs  Gallop: None ABDOMEN Liver enlargement: no Pulsatile aorta: no Ascites: no Bruits: no  EXTREMITIES Clubbing: no Edema: trace to 1+ bilateral pedal edema Pulses: peripheral pulses symmetrical Femoral Bruits: no Amputation: no SKIN Rash: no Cyanosis: no Embolic phemonenon: no Bruising:  no NEURO Alert and Oriented to person, place and time: yes Non focal: yes  PSYCH: Pt appears to have normal affect  LABS REVIEWED Last 3 CBC results: Lab Results  Component Value Date  WBC 8.0 05/30/2016  WBC 7.7 01/29/2016  WBC 8.9 01/03/2016   Lab Results  Component Value Date  HGB 11.9 (L) 05/30/2016  HGB 12.6 (L) 01/29/2016  HGB 12.1 (L) 01/03/2016   Lab Results  Component Value Date  HCT 35.3 (L) 05/30/2016  HCT 38.1 (L) 01/29/2016  HCT 35.2 (L) 01/03/2016   Lab Results  Component Value Date  PLT 221 05/30/2016  PLT 219 01/29/2016  PLT 262 01/03/2016   Lab Results  Component Value Date  CREATININE 1.1 05/30/2016  BUN 21 05/30/2016  NA 142 05/30/2016  K 4.5 05/30/2016  CL 105 05/30/2016  CO2 29.8 05/30/2016   No results found for: HGBA1C  Lab Results  Component Value Date  HDL 49.8 05/30/2016  HDL 42.8 05/23/2015  HDL 44.3 05/24/2014   Lab Results  Component Value Date  LDLCALC 113 05/30/2016  LDLCALC 125 05/23/2015  LDLCALC 139 (H) 05/24/2014   Lab Results  Component Value Date  TRIG 129 05/30/2016  TRIG 133 05/23/2015  TRIG 140 05/24/2014   Lab Results  Component Value Date  ALT 9 05/30/2016  AST  18 05/30/2016  ALKPHOS 49 05/30/2016   Lab Results  Component Value Date  TSH 5.034 05/30/2016   Diagnostic Studies Reviewed:  EKG EKG demonstrated normal sinus rhythm, nonspecific ST and T waves changes.  Assessment and Plan   82 y.o. male with  ICD-10-CM ICD-9-CM  1. Atypical chest pain-etiology of discomfort is unclear. Certainly with an abnormal functional study coronary disease may be playing a role. Patient will be maintained on enteric-coated aspirin. Will start him on metoprolol succinate at 25 mg daily and give him a prescription for sublingual nitroglycerin to be taken as needed. Will schedule for a left heart cath November 27. Risk benefits of this procedure were discussed with the patient he agrees to proceed. Further  recommendations after cardiac catheterization is completed. Patient advised to go the emergency room should he have a severe episode of chest pain prior to the study being completed. R07.89 366.81 Basic Metabolic Panel (BMP)  CBC w/auto Differential (5 Part)  2. Chest pain on exertion, unspecified R07.9 786.50   Return in about 2 weeks (around 08/21/2016).  These notes generated with voice recognition software. I apologize for typographical errors.  Sydnee Levans, MD  H and P reveiwed. No changes.

## 2016-08-16 ENCOUNTER — Ambulatory Visit
Admission: RE | Admit: 2016-08-16 | Discharge: 2016-08-16 | Disposition: A | Discharge status: Home / Self Care | Payer: Commercial Managed Care - HMO | Source: Ambulatory Visit | Attending: Internal Medicine | Admitting: Internal Medicine

## 2016-08-16 DIAGNOSIS — I6502 Occlusion and stenosis of left vertebral artery: Secondary | ICD-10-CM | POA: Insufficient documentation

## 2016-08-16 DIAGNOSIS — R918 Other nonspecific abnormal finding of lung field: Secondary | ICD-10-CM | POA: Insufficient documentation

## 2016-08-16 DIAGNOSIS — R590 Localized enlarged lymph nodes: Secondary | ICD-10-CM | POA: Diagnosis not present

## 2016-08-16 DIAGNOSIS — I6523 Occlusion and stenosis of bilateral carotid arteries: Secondary | ICD-10-CM | POA: Insufficient documentation

## 2016-08-16 DIAGNOSIS — I6521 Occlusion and stenosis of right carotid artery: Secondary | ICD-10-CM

## 2016-08-16 MED ORDER — IOPAMIDOL (ISOVUE-370) INJECTION 76%
75.0000 mL | Freq: Once | INTRAVENOUS | Status: AC | PRN
  Administered 2016-08-16: 75 mL via INTRAVENOUS

## 2016-08-19 ENCOUNTER — Other Ambulatory Visit: Payer: Self-pay | Admitting: Internal Medicine

## 2016-08-19 DIAGNOSIS — I2510 Atherosclerotic heart disease of native coronary artery without angina pectoris: Secondary | ICD-10-CM | POA: Insufficient documentation

## 2016-08-19 DIAGNOSIS — C3492 Malignant neoplasm of unspecified part of left bronchus or lung: Secondary | ICD-10-CM | POA: Insufficient documentation

## 2016-08-19 DIAGNOSIS — C3490 Malignant neoplasm of unspecified part of unspecified bronchus or lung: Secondary | ICD-10-CM

## 2016-08-19 HISTORY — DX: Atherosclerotic heart disease of native coronary artery without angina pectoris: I25.10

## 2016-08-26 ENCOUNTER — Ambulatory Visit
Admission: RE | Admit: 2016-08-26 | Discharge: 2016-08-26 | Disposition: A | Discharge status: Home / Self Care | Payer: Commercial Managed Care - HMO | Source: Ambulatory Visit | Attending: Internal Medicine | Admitting: Internal Medicine

## 2016-08-26 DIAGNOSIS — R938 Abnormal findings on diagnostic imaging of other specified body structures: Secondary | ICD-10-CM | POA: Diagnosis not present

## 2016-08-26 DIAGNOSIS — C3490 Malignant neoplasm of unspecified part of unspecified bronchus or lung: Secondary | ICD-10-CM

## 2016-08-26 DIAGNOSIS — K449 Diaphragmatic hernia without obstruction or gangrene: Secondary | ICD-10-CM | POA: Insufficient documentation

## 2016-08-26 DIAGNOSIS — R590 Localized enlarged lymph nodes: Secondary | ICD-10-CM | POA: Diagnosis not present

## 2016-08-26 DIAGNOSIS — I700 Atherosclerosis of aorta: Secondary | ICD-10-CM | POA: Diagnosis not present

## 2016-08-26 DIAGNOSIS — J90 Pleural effusion, not elsewhere classified: Secondary | ICD-10-CM | POA: Diagnosis not present

## 2016-08-26 DIAGNOSIS — C3492 Malignant neoplasm of unspecified part of left bronchus or lung: Secondary | ICD-10-CM | POA: Diagnosis not present

## 2016-08-26 LAB — POCT I-STAT CREATININE: Creatinine, Ser: 1.1 mg/dL (ref 0.61–1.24)

## 2016-08-26 MED ORDER — IOPAMIDOL (ISOVUE-300) INJECTION 61%
100.0000 mL | Freq: Once | INTRAVENOUS | Status: AC | PRN
  Administered 2016-08-26: 100 mL via INTRAVENOUS

## 2016-08-27 ENCOUNTER — Other Ambulatory Visit: Payer: Self-pay

## 2016-08-30 ENCOUNTER — Encounter: Payer: Self-pay | Admitting: Cardiothoracic Surgery

## 2016-08-30 ENCOUNTER — Ambulatory Visit (INDEPENDENT_AMBULATORY_CARE_PROVIDER_SITE_OTHER): Payer: Commercial Managed Care - HMO | Admitting: Cardiothoracic Surgery

## 2016-08-30 VITALS — BP 178/78 | HR 86 | Temp 98.2°F | Resp 20 | Ht 72.0 in | Wt 180.0 lb

## 2016-08-30 DIAGNOSIS — R918 Other nonspecific abnormal finding of lung field: Secondary | ICD-10-CM

## 2016-08-30 MED ORDER — DOXYCYCLINE HYCLATE 50 MG PO CAPS: 50.0000 mg | ORAL_CAPSULE | Freq: Two times a day (BID) | ORAL | 0 refills | Status: DC

## 2016-08-30 NOTE — Progress Notes (Signed)
Patient ID: Noah Coleman, male   DOB: September 23, 1929, 82 y.o.   MRN: [id number]  Chief Complaint  Patient presents with  . New Patient (Initial Visit)    Left Lung mass    Referred By Dr. Jola Baptist Reason for Referral left upper lobe mass  HPI Location, Quality, Duration, Severity, Timing, Context, Modifying Factors, Associated Signs and Symptoms.  Noah Coleman is a 82 y.o. male.  He is in excellent health and walks 2-3 miles per day. About a month or so ago he began experiencing increasing shortness of breath and called his cardiologist. At that time it was felt that he may have some issue with his heart and he did undergo cardiac catheterization that did not reveal any evidence of occlusive coronary disease. The Plavix was stopped but he did remain on aspirin. He then had a chest x-ray and a subsequent CT scan. The CT scan revealed an irregularly-shaped 5 cm mass in the left upper lobe with extensive mediastinal adenopathy. Of note is that this was not present back in February of this year. There is no indication that the patient has a lung abscess as he denied any fevers or chills. He denied any hemoptysis or weight loss. He is an ex-smoker having quit in 1982 and having smoked for about 25-30 years overall. He has been short of breath over the last month or so but this is slowly improving as well. He does have a family history of lung cancer in his father and a history of ovarian cancer in his mother.   Past Medical History:  Diagnosis Date  . Anemia   . BPH (benign prostatic hyperplasia)   . Chronic idiopathic urticaria   . Diverticulosis   . Dupuytren's contracture of right hand   . Hyperlipemia   . Shingles     Past Surgical History:  Procedure Laterality Date  . CARDIAC CATHETERIZATION Left 08/12/2016   Procedure: Left Heart Cath and Coronary Angiography;  Surgeon: Teodoro Spray, MD;  Location: [facility];  Service: Cardiovascular;  Laterality: Left;  .  COLONOSCOPY    . EUS N/A 02/22/2016   Procedure: UPPER ENDOSCOPIC ULTRASOUND (EUS) LINEAR;  Surgeon: Cora Daniels, MD;  Location: [facility] END[facility]COPY;  Service: Endoscopy;  Laterality: N/A;  . TONSILLECTOMY      Family History  Problem Relation Age of Onset  . Cancer Mother     unknown type  . Lung cancer Father   . Colon cancer Father     Social History Social History  Substance Use Topics  . Smoking status: Former Smoker    Years: 30.00    Types: Cigarettes    Quit date: 08/12/1981  . Smokeless tobacco: Never Used  . Alcohol use No    No Known Allergies  Current Outpatient Prescriptions  Medication Sig Dispense Refill  . aspirin EC 81 MG tablet Take 1 tablet by mouth 1 day or 1 dose.    . Cyanocobalamin (RA VITAMIN B-12 TR) 1000 MCG TBCR Take 1 tablet by mouth 1 day or 1 dose.    . cyanocobalamin 500 MCG tablet Take 500 mcg by mouth daily.    . fexofenadine (ALLEGRA) 180 MG tablet Take 1 tablet by mouth 1 day or 1 dose.    . Multiple Vitamin (MULTIVITAMIN) capsule Take 1 capsule by mouth daily.    . Probiotic Product (PROBIOTIC DAILY) CAPS Take 1 capsule by mouth daily.    . vitamin B-12 (CYANOCOBALAMIN) 500 MCG tablet Take 500  mcg by mouth daily.     No current facility-administered medications for this visit.    Facility-Administered Medications Ordered in Other Visits  Medication Dose Route Frequency Provider Last Rate Last Dose  . 0.9 %  sodium chloride infusion  250 mL Intravenous PRN Teodoro Spray, MD      . 0.9 %  sodium chloride infusion  250 mL Intravenous PRN Teodoro Spray, MD      . 0.9% sodium chloride infusion  1 mL/kg/hr Intravenous Continuous Teodoro Spray, MD      . 0.9% sodium chloride infusion  1 mL/kg/hr Intravenous Continuous Teodoro Spray, MD      . sodium chloride flush (NS) 0.9 % injection 3 mL  3 mL Intravenous Q12H Teodoro Spray, MD      . sodium chloride flush (NS) 0.9 % injection 3 mL  3 mL Intravenous PRN Teodoro Spray, MD      .  sodium chloride flush (NS) 0.9 % injection 3 mL  3 mL Intravenous Q12H Teodoro Spray, MD      . sodium chloride flush (NS) 0.9 % injection 3 mL  3 mL Intravenous PRN Teodoro Spray, MD          Review of Systems A complete review of systems was asked and was negative except for the following positive findings Shortness of breath. Cough, blurry vision  Blood pressure (!) 178/78, pulse 86, temperature 98.2 F (36.8 C), temperature source Oral, resp. rate 20, height 6' (1.829 m), weight 180 lb (81.6 kg), SpO2 96 %.  Physical Exam CONSTITUTIONAL:  Pleasant, well-developed, well-nourished, and in no acute distress. EYES: Pupils equal and reactive to light, Sclera non-icteric EARS, NOSE, MOUTH AND THROAT:  The oropharynx was clear.  Dentition is good repair.  Oral mucosa pink and moist. LYMPH NODES:  Lymph nodes in the neck and axillae were normal RESPIRATORY:  Lungs were equal bilaterally but with by basilar rales..  Normal respiratory effort without pathologic use of accessory muscles of respiration CARDIOVASCULAR: Heart was regular without murmurs.  There were no carotid bruits. GI: The abdomen was soft, nontender, and nondistended. There were no palpable masses. There was no hepatosplenomegaly. There were normal bowel sounds in all quadrants. GU:  Rectal deferred.   MUSCULOSKELETAL:  Normal muscle strength and tone.  No clubbing or cyanosis.   SKIN:  There were no pathologic skin lesions.  There were no nodules on palpation. NEUROLOGIC:  Sensation is normal.  Cranial nerves are grossly intact. PSYCH:  Oriented to person, place and time.  Mood and affect are normal.  Data Reviewed I have independently reviewed the patient's CT scans.  I have personally reviewed the patient's imaging, laboratory findings and medical records.    Assessment    I believe that the left upper lobe lesion is most likely a bronchogenic carcinoma. There is extensive mediastinal adenopathy. I believe that this  most likely represents stage III the lung cancer.    Plan    I explained to the patient that I thought that a PET scan is indicated as well as a biopsy. We discussed the option of percutaneous biopsy or bronchoscopic biopsy. We also discussed the role of surgery for extensive stage small cell and non-small cell lung cancers. At the present time I do not believe that he is a surgical candidate on the basis of the CT scan. I will obtain a PET scan. I will obtain a percutaneous lung biopsy. The patient did have  a CT scan of the head within the last month and that did not reveal any evidence of metastatic disease. We will make a referral to our oncologist and radiation therapist.       Nestor Lewandowsky, MD 08/30/2016, 10:51 AM

## 2016-08-30 NOTE — Patient Instructions (Addendum)
We will schedule you the following images: CT Guided Lung Biopsy PET Scan   Once we schedule these images, we will be calling you with the dates and times. We will call you with the results.  We will also refer you to see one of our Oncologist and Radiation Oncologist. They are located at the [facility]. They will contact you to schedule your appointment. If you do not hear from them within a week, please give [place] a call so we could check on the referral.

## 2016-08-30 NOTE — Addendum Note (Signed)
Addended by: Wayna Chalet on: 08/30/2016 12:06 PM   Modules accepted: Orders

## 2016-09-01 DIAGNOSIS — R918 Other nonspecific abnormal finding of lung field: Secondary | ICD-10-CM | POA: Insufficient documentation

## 2016-09-01 NOTE — Progress Notes (Signed)
[place]  [phone] [phone]) [phone]  ID: Noah Coleman OB: March 12, 1930  MR#: [id number]  WNU#:[id number]  Patient Care Team: Rusty Aus, MD as PCP - General (Internal Medicine)  CHIEF COMPLAINT: Left upper lobe lung mass.  INTERVAL HISTORY: Patient is an 82 year old male who initially presented with increased weakness and shortness of breath after his daily two-mile walk. Patient had full cardiac workup did not reveal an etiology, but then had a CT scan which revealed a large 5 cm left upper lobe lung mass with mediastinal lymphadenopathy. Currently he feels well and is asymptomatic. He has no neurologic complaints. He denies any recent fevers or illnesses. He has a good appetite and denies weight loss. He has no chest pain, hemoptysis, or cough. He denies any further shortness of breath. He has no nausea, vomiting, constipation, or diarrhea. He has no urinary complaints. Patient feels at his baseline and offers no specific complaints today.  REVIEW OF SYSTEMS:   Review of Systems  Constitutional: Negative.  Negative for fever, malaise/fatigue and weight loss.  Respiratory: Negative.  Negative for cough, hemoptysis and shortness of breath.   Cardiovascular: Negative.  Negative for chest pain and leg swelling.  Gastrointestinal: Negative.  Negative for abdominal pain.  Genitourinary: Negative.   Musculoskeletal: Negative.   Neurological: Negative.  Negative for weakness.  Psychiatric/Behavioral: Negative.  The patient is not nervous/anxious.     As per HPI. Otherwise, a complete review of systems is negative.  PAST MEDICAL HISTORY: Past Medical History:  Diagnosis Date  . Anemia   . BPH (benign prostatic hyperplasia)   . Chronic idiopathic urticaria   . Diverticulosis   . Dupuytren's contracture of right hand   . Hyperlipemia   . Shingles     PAST SURGICAL HISTORY: Past Surgical History:  Procedure Laterality Date  . CARDIAC  CATHETERIZATION Left 08/12/2016   Procedure: Left Heart Cath and Coronary Angiography;  Surgeon: Teodoro Spray, MD;  Location: [facility];  Service: Cardiovascular;  Laterality: Left;  . COLONOSCOPY    . EUS N/A 02/22/2016   Procedure: UPPER ENDOSCOPIC ULTRASOUND (EUS) LINEAR;  Surgeon: Cora Daniels, MD;  Location: [facility];  Service: Endoscopy;  Laterality: N/A;  . TONSILLECTOMY      FAMILY HISTORY: Family History  Problem Relation Age of Onset  . Cancer Mother     unknown type  . Lung cancer Father   . Colon cancer Father     ADVANCED DIRECTIVES (Y/N):  N  HEALTH MAINTENANCE: Social History  Substance Use Topics  . Smoking status: Former Smoker    Years: 30.00    Types: Cigarettes    Quit date: 08/12/1981  . Smokeless tobacco: Never Used  . Alcohol use No     Colonoscopy:  PAP:  Bone density:  Lipid panel:  No Known Allergies  Current Outpatient Prescriptions  Medication Sig Dispense Refill  . aspirin EC 81 MG tablet Take 1 tablet by mouth 1 day or 1 dose.    . Cyanocobalamin (RA VITAMIN B-12 TR) 1000 MCG TBCR Take 1 tablet by mouth 1 day or 1 dose.    . fexofenadine (ALLEGRA) 180 MG tablet Take 1 tablet by mouth 1 day or 1 dose.    . Multiple Vitamin (MULTIVITAMIN) capsule Take 1 capsule by mouth daily.    . Probiotic Product (PROBIOTIC DAILY) CAPS Take 1 capsule by mouth daily.    Marland Kitchen doxycycline (MONODOX) 50 MG capsule      No  current facility-administered medications for this visit.    Facility-Administered Medications Ordered in Other Visits  Medication Dose Route Frequency Provider Last Rate Last Dose  . 0.9 %  sodium chloride infusion  250 mL Intravenous PRN Teodoro Spray, MD      . 0.9 %  sodium chloride infusion  250 mL Intravenous PRN Teodoro Spray, MD      . 0.9% sodium chloride infusion  1 mL/kg/hr Intravenous Continuous Teodoro Spray, MD      . 0.9% sodium chloride infusion  1 mL/kg/hr Intravenous Continuous Teodoro Spray,  MD      . sodium chloride flush (NS) 0.9 % injection 3 mL  3 mL Intravenous Q12H Teodoro Spray, MD      . sodium chloride flush (NS) 0.9 % injection 3 mL  3 mL Intravenous PRN Teodoro Spray, MD      . sodium chloride flush (NS) 0.9 % injection 3 mL  3 mL Intravenous Q12H Teodoro Spray, MD      . sodium chloride flush (NS) 0.9 % injection 3 mL  3 mL Intravenous PRN Teodoro Spray, MD        OBJECTIVE: Vitals:   09/02/16 1125  BP: (!) 152/78  Pulse: 79  Resp: 18  Temp: 98.6 F (37 C)     Body mass index is 24.08 kg/m.    ECOG FS:0 - Asymptomatic  General: Well-developed, well-nourished, no acute distress. Eyes: Pink conjunctiva, anicteric sclera. HEENT: Normocephalic, moist mucous membranes, clear oropharnyx. Lungs: Clear to auscultation bilaterally. Heart: Regular rate and rhythm. No rubs, murmurs, or gallops. Abdomen: Soft, nontender, nondistended. No organomegaly noted, normoactive bowel sounds. Musculoskeletal: No edema, cyanosis, or clubbing. Neuro: Alert, answering all questions appropriately. Cranial nerves grossly intact. Skin: No rashes or petechiae noted. Psych: Normal affect. Lymphatics: No cervical, calvicular, axillary or inguinal LAD.   LAB RESULTS:  Lab Results  Component Value Date   CREATININE 1.10 08/26/2016    No results found for: WBC, NEUTROABS, HGB, HCT, MCV, PLT   STUDIES: Ct Angio Head W Or Wo Contrast  Addendum Date: 08/16/2016   ADDENDUM REPORT: 08/16/2016 12:37 ADDENDUM: Case discussed with Dr. Sabra Heck via telephone at time of addendum. Electronically Signed   By: Monte Fantasia M.D.   On: 08/16/2016 12:37   Result Date: 08/16/2016 CLINICAL DATA:  ICA stenosis on carotid Doppler. Dizziness when walking. EXAM: CT ANGIOGRAPHY HEAD AND NECK TECHNIQUE: Multidetector CT imaging of the head and neck was performed using the standard protocol during bolus administration of intravenous contrast. Multiplanar CT image reconstructions and MIPs were  obtained to evaluate the vascular anatomy. Carotid stenosis measurements (when applicable) are obtained utilizing NASCET criteria, using the distal internal carotid diameter as the denominator. CONTRAST:  75 cc Isovue 370 intravenous COMPARISON:  Neck ultrasound 08/02/2016 FINDINGS: CT HEAD FINDINGS Brain: No evidence of acute infarction, hemorrhage, hydrocephalus, extra-axial collection or mass lesion/mass effect. Vascular: Atherosclerotic calcification. Skull: No acute or aggressive finding Sinuses: Negative Orbits: Negative Review of the MIP images confirms the above findings CTA NECK FINDINGS Aortic arch: Atherosclerotic calcification. Right carotid system: Atheromatous changes with mixed density plaque predominately at the carotid bifurcation and proximal ICA with stenosis measuring 25-30% at maximum. No evidence of ulceration or dissection Left carotid system: Atherosclerosis with mixed density plaque primarily at the bifurcation. Proximal ICA narrowing up to 40%. No ulceration or dissection Vertebral arteries: Mild right vertebral artery dominance. No flow limiting stenosis on the right. Left vertebral artery origin  stenosis that is moderate range. Mild luminal undulation at the bilateral distal V3 segment. No dissection flap. Skeleton: No acute or aggressive finding. Other neck: No supraclavicular adenopathy. Upper chest: There is new nodularity in the subpleural left upper lobe with satellite nodules. The largest area of confluent area measures 43 mm. Bulky adenopathy in the left mediastinum and visualized left hilum where there is mild pulmonary artery narrowing. Partly visible trace left effusion. Review of the MIP images confirms the above findings CTA HEAD FINDINGS Anterior circulation: Symmetric carotid arteries. No major branch occlusion or flow limiting stenosis. Left ACA fenestration at the anterior communicating artery, incidental. Negative for aneurysm. Posterior circulation: Proximal basilar  fenestration. No major branch occlusion or flow limiting stenosis. No suspected vertebrobasilar insufficiency to explain patient's intermittent dizziness. Venous sinuses: Patent Anatomic variants: Fenestrations described above Delayed phase: No parenchymal enhancement or mass lesion. Call has been placed to Dr. Sabra Heck to communicate the unexpected findings. Report will be published so that the report is readily available. Review of the MIP images confirms the above findings IMPRESSION: 1. Left upper lobe mass that is new from February 2017, with bulky left mediastinal/hilar adenopathy, most consistent with metastatic bronchogenic carcinoma. Recommend CTS referral and chest CT. 2. Cervical carotid atherosclerosis with bilateral ICA narrowing measuring less than 50%. 3. Moderate left vertebral artery ostial stenosis with wide patency of the dominant right vertebral artery. 4. Possible mild fibromuscular dysplasia involving the distal vertebral arteries. No dissection flap. Electronically Signed: By: Monte Fantasia M.D. On: 08/16/2016 11:51   Ct Angio Neck W Or Wo Contrast  Addendum Date: 08/16/2016   ADDENDUM REPORT: 08/16/2016 12:37 ADDENDUM: Case discussed with Dr. Sabra Heck via telephone at time of addendum. Electronically Signed   By: Monte Fantasia M.D.   On: 08/16/2016 12:37   Result Date: 08/16/2016 CLINICAL DATA:  ICA stenosis on carotid Doppler. Dizziness when walking. EXAM: CT ANGIOGRAPHY HEAD AND NECK TECHNIQUE: Multidetector CT imaging of the head and neck was performed using the standard protocol during bolus administration of intravenous contrast. Multiplanar CT image reconstructions and MIPs were obtained to evaluate the vascular anatomy. Carotid stenosis measurements (when applicable) are obtained utilizing NASCET criteria, using the distal internal carotid diameter as the denominator. CONTRAST:  75 cc Isovue 370 intravenous COMPARISON:  Neck ultrasound 08/02/2016 FINDINGS: CT HEAD FINDINGS Brain:  No evidence of acute infarction, hemorrhage, hydrocephalus, extra-axial collection or mass lesion/mass effect. Vascular: Atherosclerotic calcification. Skull: No acute or aggressive finding Sinuses: Negative Orbits: Negative Review of the MIP images confirms the above findings CTA NECK FINDINGS Aortic arch: Atherosclerotic calcification. Right carotid system: Atheromatous changes with mixed density plaque predominately at the carotid bifurcation and proximal ICA with stenosis measuring 25-30% at maximum. No evidence of ulceration or dissection Left carotid system: Atherosclerosis with mixed density plaque primarily at the bifurcation. Proximal ICA narrowing up to 40%. No ulceration or dissection Vertebral arteries: Mild right vertebral artery dominance. No flow limiting stenosis on the right. Left vertebral artery origin stenosis that is moderate range. Mild luminal undulation at the bilateral distal V3 segment. No dissection flap. Skeleton: No acute or aggressive finding. Other neck: No supraclavicular adenopathy. Upper chest: There is new nodularity in the subpleural left upper lobe with satellite nodules. The largest area of confluent area measures 43 mm. Bulky adenopathy in the left mediastinum and visualized left hilum where there is mild pulmonary artery narrowing. Partly visible trace left effusion. Review of the MIP images confirms the above findings CTA HEAD FINDINGS Anterior circulation: Symmetric  carotid arteries. No major branch occlusion or flow limiting stenosis. Left ACA fenestration at the anterior communicating artery, incidental. Negative for aneurysm. Posterior circulation: Proximal basilar fenestration. No major branch occlusion or flow limiting stenosis. No suspected vertebrobasilar insufficiency to explain patient's intermittent dizziness. Venous sinuses: Patent Anatomic variants: Fenestrations described above Delayed phase: No parenchymal enhancement or mass lesion. Call has been placed to Dr.  Sabra Heck to communicate the unexpected findings. Report will be published so that the report is readily available. Review of the MIP images confirms the above findings IMPRESSION: 1. Left upper lobe mass that is new from February 2017, with bulky left mediastinal/hilar adenopathy, most consistent with metastatic bronchogenic carcinoma. Recommend CTS referral and chest CT. 2. Cervical carotid atherosclerosis with bilateral ICA narrowing measuring less than 50%. 3. Moderate left vertebral artery ostial stenosis with wide patency of the dominant right vertebral artery. 4. Possible mild fibromuscular dysplasia involving the distal vertebral arteries. No dissection flap. Electronically Signed: By: Monte Fantasia M.D. On: 08/16/2016 11:51   Ct Chest W Contrast  Result Date: 08/26/2016 CLINICAL DATA:  Abnormal finding on recent CT scan. EXAM: CT CHEST, ABDOMEN, AND PELVIS WITH CONTRAST TECHNIQUE: Multidetector CT imaging of the chest, abdomen and pelvis was performed following the standard protocol during bolus administration of intravenous contrast. CONTRAST:  173m ISOVUE-300 IOPAMIDOL (ISOVUE-300) INJECTION 61% COMPARISON:  CT scan of neck of August 16, 2016. CT scan of abdomen and pelvis of December 21, 2015. CT scan of chest of November 03, 2015. FINDINGS: CT CHEST FINDINGS Cardiovascular: Atherosclerosis of thoracic aorta is noted without aneurysm or dissection. Coronary artery calcifications are noted suggesting coronary artery disease. Mediastinum/Nodes: Extensive mediastinal adenopathy is noted, particularly in the aortopulmonary window, which measures 5.7 x 4.6 cm. Left infrahilar mass measuring 23 x 22 mm is noted. Subcarinal lymph node measuring 22 x 10 mm is noted. Pretracheal left tib measuring 20 x 10 mm is noted. Lungs/Pleura: No pneumothorax is noted. Large irregular pleural-based mass measuring 6.1 x 4.2 x 2.3 cm is noted laterally in left upper lobe concerning for malignancy. Honeycombing and  bronchiectasis is noted in both lower lobes consistent with pulmonary fibrosis. Mild left pleural effusion is noted with multiple small pleural-based nodules concerning for metastatic disease. Musculoskeletal: No chest wall mass or suspicious bone lesions identified. CT ABDOMEN PELVIS FINDINGS Hepatobiliary: No focal liver abnormality is seen. No gallstones, gallbladder wall thickening, or biliary dilatation. Pancreas: Stable 17 mm low density seen in pancreatic head. Spleen: Splenic granulomata are noted. Adrenals/Urinary Tract: Adrenal glands appear normal. Stable exophytic right renal cyst is noted. No hydronephrosis or renal obstruction is noted. No renal or ureteral calculi are noted. Urinary bladder appears normal. Stomach/Bowel: Moderate size hiatal hernia is again noted. The appendix appears normal. There is no evidence of bowel obstruction. Vascular/Lymphatic: Atherosclerosis of abdominal aorta is noted without aneurysm formation. No significant adenopathy is noted in the abdomen or pelvis. Reproductive: Prostate is unremarkable. Other: No abdominal wall hernia or abnormality. No abdominopelvic ascites. Musculoskeletal: Severe degenerative disc disease is noted at L5-S1. IMPRESSION: 6.1 x 4.2 x 2.3 cm mass noted laterally in left upper lobe consistent with malignancy. Mild left pleural effusion is noted with multiple small pleural-based nodules present in the left lung base concerning for metastatic disease. Extensive mediastinal adenopathy is noted as described above. This is concerning for metastatic disease. Stable moderate size hiatal hernia. Aortic atherosclerosis. Stable 17 mm low density seen in pancreatic head. Recommend follow up pre and post contrast MRI/MRCP or pancreatic protocol  CT in 2 years. This recommendation follows ACR consensus guidelines: Management of Incidental Pancreatic Cysts: A White Paper of the ACR Incidental Findings Committee. [place] [id number];06:[id number]. These results  will be called to the ordering clinician or representative by the Radiologist Assistant, and communication documented in the PACS or zVision Dashboard. Electronically Signed   By: Marijo Conception, M.D.   On: 08/26/2016 09:34   Ct Abdomen Pelvis W Contrast  Result Date: 08/26/2016 CLINICAL DATA:  Abnormal finding on recent CT scan. EXAM: CT CHEST, ABDOMEN, AND PELVIS WITH CONTRAST TECHNIQUE: Multidetector CT imaging of the chest, abdomen and pelvis was performed following the standard protocol during bolus administration of intravenous contrast. CONTRAST:  128m ISOVUE-300 IOPAMIDOL (ISOVUE-300) INJECTION 61% COMPARISON:  CT scan of neck of August 16, 2016. CT scan of abdomen and pelvis of December 21, 2015. CT scan of chest of November 03, 2015. FINDINGS: CT CHEST FINDINGS Cardiovascular: Atherosclerosis of thoracic aorta is noted without aneurysm or dissection. Coronary artery calcifications are noted suggesting coronary artery disease. Mediastinum/Nodes: Extensive mediastinal adenopathy is noted, particularly in the aortopulmonary window, which measures 5.7 x 4.6 cm. Left infrahilar mass measuring 23 x 22 mm is noted. Subcarinal lymph node measuring 22 x 10 mm is noted. Pretracheal left tib measuring 20 x 10 mm is noted. Lungs/Pleura: No pneumothorax is noted. Large irregular pleural-based mass measuring 6.1 x 4.2 x 2.3 cm is noted laterally in left upper lobe concerning for malignancy. Honeycombing and bronchiectasis is noted in both lower lobes consistent with pulmonary fibrosis. Mild left pleural effusion is noted with multiple small pleural-based nodules concerning for metastatic disease. Musculoskeletal: No chest wall mass or suspicious bone lesions identified. CT ABDOMEN PELVIS FINDINGS Hepatobiliary: No focal liver abnormality is seen. No gallstones, gallbladder wall thickening, or biliary dilatation. Pancreas: Stable 17 mm low density seen in pancreatic head. Spleen: Splenic granulomata are noted.  Adrenals/Urinary Tract: Adrenal glands appear normal. Stable exophytic right renal cyst is noted. No hydronephrosis or renal obstruction is noted. No renal or ureteral calculi are noted. Urinary bladder appears normal. Stomach/Bowel: Moderate size hiatal hernia is again noted. The appendix appears normal. There is no evidence of bowel obstruction. Vascular/Lymphatic: Atherosclerosis of abdominal aorta is noted without aneurysm formation. No significant adenopathy is noted in the abdomen or pelvis. Reproductive: Prostate is unremarkable. Other: No abdominal wall hernia or abnormality. No abdominopelvic ascites. Musculoskeletal: Severe degenerative disc disease is noted at L5-S1. IMPRESSION: 6.1 x 4.2 x 2.3 cm mass noted laterally in left upper lobe consistent with malignancy. Mild left pleural effusion is noted with multiple small pleural-based nodules present in the left lung base concerning for metastatic disease. Extensive mediastinal adenopathy is noted as described above. This is concerning for metastatic disease. Stable moderate size hiatal hernia. Aortic atherosclerosis. Stable 17 mm low density seen in pancreatic head. Recommend follow up pre and post contrast MRI/MRCP or pancreatic protocol CT in 2 years. This recommendation follows ACR consensus guidelines: Management of Incidental Pancreatic Cysts: A White Paper of the ACR Incidental Findings Committee. [id number];03:[id number] These results will be called to the ordering clinician or representative by the Radiologist Assistant, and communication documented in the PACS or zVision Dashboard. Electronically Signed   By: JMarijo Conception M.D.   On: 08/26/2016 09:34    ASSESSMENT: Left upper lobe lung mass.  PLAN:    1. Left upper lobe lung mass: CT scan results reviewed independently and reported as above highly suspicious for stage III carcinoma  of the lung. Patient has a PET scan as well as CT-guided biopsy scheduled in the near future to  confirm malignancy. Case discussed with thoracic surgery. Return to clinic approximately one week after his biopsy to discuss the results and treatment planning.  2. Hypertension: Patient's blood pressure is mildly elevated today, monitor.  Approximately 45 minutes was spent in discussion of which greater than 50% was consultation.  Patient expressed understanding and was in agreement with this plan. He also understands that He can call clinic at any time with any questions, concerns, or complaints.   No matching staging information was found for the patient.  Lloyd Huger, MD   09/03/2016 11:24 AM

## 2016-09-02 ENCOUNTER — Telehealth: Payer: Self-pay | Admitting: Cardiothoracic Surgery

## 2016-09-02 ENCOUNTER — Inpatient Hospital Stay: Payer: Commercial Managed Care - HMO | Attending: Oncology | Admitting: Oncology

## 2016-09-02 DIAGNOSIS — N400 Enlarged prostate without lower urinary tract symptoms: Secondary | ICD-10-CM | POA: Insufficient documentation

## 2016-09-02 DIAGNOSIS — I10 Essential (primary) hypertension: Secondary | ICD-10-CM | POA: Diagnosis not present

## 2016-09-02 DIAGNOSIS — E785 Hyperlipidemia, unspecified: Secondary | ICD-10-CM | POA: Diagnosis not present

## 2016-09-02 DIAGNOSIS — I2510 Atherosclerotic heart disease of native coronary artery without angina pectoris: Secondary | ICD-10-CM | POA: Diagnosis not present

## 2016-09-02 DIAGNOSIS — I700 Atherosclerosis of aorta: Secondary | ICD-10-CM | POA: Insufficient documentation

## 2016-09-02 DIAGNOSIS — I6529 Occlusion and stenosis of unspecified carotid artery: Secondary | ICD-10-CM | POA: Insufficient documentation

## 2016-09-02 DIAGNOSIS — Z79899 Other long term (current) drug therapy: Secondary | ICD-10-CM | POA: Diagnosis not present

## 2016-09-02 DIAGNOSIS — Z87891 Personal history of nicotine dependence: Secondary | ICD-10-CM

## 2016-09-02 DIAGNOSIS — N281 Cyst of kidney, acquired: Secondary | ICD-10-CM | POA: Insufficient documentation

## 2016-09-02 DIAGNOSIS — R918 Other nonspecific abnormal finding of lung field: Secondary | ICD-10-CM | POA: Insufficient documentation

## 2016-09-02 DIAGNOSIS — K449 Diaphragmatic hernia without obstruction or gangrene: Secondary | ICD-10-CM | POA: Diagnosis not present

## 2016-09-02 DIAGNOSIS — Z7982 Long term (current) use of aspirin: Secondary | ICD-10-CM | POA: Insufficient documentation

## 2016-09-02 NOTE — Progress Notes (Signed)
Patient is here as a new patient

## 2016-09-02 NOTE — Telephone Encounter (Signed)
Patient has called and stated that he seen Dr Genevive Bi on 08/30/16. When he went for another appointment today (09/02/16) he was told that per the medication list that Dr Genevive Bi prescribed him Doxycycline, in which he went and picked up from his pharmacy. However once he got home he seen Dr Corlis Leak name on the prescription. He states that Dr Genevive Bi did not discuss any medications to him at his visit.  I have read over the notes and do not see any documentation of discussion of this medication, however I do see that it was added to the chart on 08/30/16. I advised the patient to not take the medication until he speaks with a nurse. Please call patient and advise him of directions.

## 2016-09-03 NOTE — Telephone Encounter (Signed)
Called patient to let him know that he shouldn't take Doxycycline 50 MG. I told him that I reviewed his chart and he did not need to take this medication. I told him that it was an error on our part and therefore, I apologized and told him not to take the medication. Patient understood and stated that he had not started taking it. Patient did not have any further questions.

## 2016-09-04 ENCOUNTER — Other Ambulatory Visit: Payer: Self-pay | Admitting: Cardiothoracic Surgery

## 2016-09-04 NOTE — Addendum Note (Signed)
Addended by: Wayna Chalet on: 09/04/2016 03:23 PM   Modules accepted: Orders

## 2016-09-11 ENCOUNTER — Encounter
Admission: RE | Admit: 2016-09-11 | Discharge: 2016-09-11 | Disposition: A | Discharge status: Home / Self Care | Payer: Commercial Managed Care - HMO | Source: Ambulatory Visit | Attending: Cardiothoracic Surgery | Admitting: Cardiothoracic Surgery

## 2016-09-11 DIAGNOSIS — R918 Other nonspecific abnormal finding of lung field: Secondary | ICD-10-CM | POA: Insufficient documentation

## 2016-09-11 LAB — GLUCOSE, CAPILLARY: GLUCOSE-CAPILLARY: 78 mg/dL (ref 65–99)

## 2016-09-11 MED ORDER — FLUDEOXYGLUCOSE F - 18 (FDG) INJECTION
11.1300 | Freq: Once | INTRAVENOUS | Status: AC | PRN
  Administered 2016-09-11: 11.13 via INTRAVENOUS

## 2016-09-12 ENCOUNTER — Telehealth: Payer: Self-pay

## 2016-09-12 NOTE — Telephone Encounter (Signed)
Called patient to let him know that I received a call from [place] today in reference to his CT Guided Lung Biopsy appointment. I informed patient that it is scheduled to be done on 09/19/2016 at 7:30 AM at the [facility]. Patient was also informed not to eat or drink anything after midnight the night before. Patient understood and had no further questions.

## 2016-09-18 ENCOUNTER — Other Ambulatory Visit: Payer: Self-pay | Admitting: Radiology

## 2016-09-19 ENCOUNTER — Ambulatory Visit
Admission: RE | Admit: 2016-09-19 | Discharge: 2016-09-19 | Disposition: A | Discharge status: Home / Self Care | Payer: Medicare HMO | Source: Ambulatory Visit | Attending: Cardiothoracic Surgery | Admitting: Cardiothoracic Surgery

## 2016-09-19 DIAGNOSIS — Z87891 Personal history of nicotine dependence: Secondary | ICD-10-CM | POA: Diagnosis not present

## 2016-09-19 DIAGNOSIS — Z7982 Long term (current) use of aspirin: Secondary | ICD-10-CM | POA: Insufficient documentation

## 2016-09-19 DIAGNOSIS — R918 Other nonspecific abnormal finding of lung field: Secondary | ICD-10-CM

## 2016-09-19 DIAGNOSIS — C3490 Malignant neoplasm of unspecified part of unspecified bronchus or lung: Secondary | ICD-10-CM | POA: Diagnosis not present

## 2016-09-19 DIAGNOSIS — E785 Hyperlipidemia, unspecified: Secondary | ICD-10-CM | POA: Insufficient documentation

## 2016-09-19 DIAGNOSIS — C782 Secondary malignant neoplasm of pleura: Secondary | ICD-10-CM | POA: Insufficient documentation

## 2016-09-19 DIAGNOSIS — Z801 Family history of malignant neoplasm of trachea, bronchus and lung: Secondary | ICD-10-CM | POA: Diagnosis not present

## 2016-09-19 DIAGNOSIS — Z8619 Personal history of other infectious and parasitic diseases: Secondary | ICD-10-CM | POA: Insufficient documentation

## 2016-09-19 DIAGNOSIS — I700 Atherosclerosis of aorta: Secondary | ICD-10-CM | POA: Diagnosis not present

## 2016-09-19 DIAGNOSIS — C7951 Secondary malignant neoplasm of bone: Secondary | ICD-10-CM | POA: Diagnosis not present

## 2016-09-19 DIAGNOSIS — Z800 Family history of malignant neoplasm of digestive organs: Secondary | ICD-10-CM | POA: Insufficient documentation

## 2016-09-19 DIAGNOSIS — I2510 Atherosclerotic heart disease of native coronary artery without angina pectoris: Secondary | ICD-10-CM | POA: Insufficient documentation

## 2016-09-19 DIAGNOSIS — N400 Enlarged prostate without lower urinary tract symptoms: Secondary | ICD-10-CM | POA: Diagnosis not present

## 2016-09-19 HISTORY — DX: Personal history of other diseases of the digestive system: Z87.19

## 2016-09-19 LAB — CBC
HCT: 34.8 % — ABNORMAL LOW (ref 40.0–52.0)
Hemoglobin: 12 g/dL — ABNORMAL LOW (ref 13.0–18.0)
MCH: 34.3 pg — ABNORMAL HIGH (ref 26.0–34.0)
MCHC: 34.6 g/dL (ref 32.0–36.0)
MCV: 99 fL (ref 80.0–100.0)
PLATELETS: 258 10*3/uL (ref 150–440)
RBC: 3.51 MIL/uL — ABNORMAL LOW (ref 4.40–5.90)
RDW: 13.5 % (ref 11.5–14.5)
WBC: 7.8 10*3/uL (ref 3.8–10.6)

## 2016-09-19 LAB — BASIC METABOLIC PANEL
Anion gap: 4 — ABNORMAL LOW (ref 5–15)
BUN: 21 mg/dL — AB (ref 6–20)
CO2: 27 mmol/L (ref 22–32)
CREATININE: 1.08 mg/dL (ref 0.61–1.24)
Calcium: 9.1 mg/dL (ref 8.9–10.3)
Chloride: 107 mmol/L (ref 101–111)
GFR calc Af Amer: >60 mL/min (ref >60)
GFR calc non Af Amer: >60 mL/min (ref >60)
Glucose, Bld: 99 mg/dL (ref 65–99)
Potassium: 4 mmol/L (ref 3.5–5.1)
SODIUM: 138 mmol/L (ref 135–145)

## 2016-09-19 LAB — PROTIME-INR
INR: 1.05
Prothrombin Time: 13.7 seconds (ref 11.4–15.2)

## 2016-09-19 LAB — APTT: aPTT: 30 seconds (ref 24–36)

## 2016-09-19 MED ORDER — MIDAZOLAM HCL 5 MG/5ML IJ SOLN
INTRAMUSCULAR | Status: AC | PRN
  Administered 2016-09-19: 1 mg via INTRAVENOUS

## 2016-09-19 MED ORDER — MIDAZOLAM HCL 5 MG/5ML IJ SOLN
INTRAMUSCULAR | Status: AC
  Filled 2016-09-19: qty 5

## 2016-09-19 MED ORDER — FENTANYL CITRATE (PF) 100 MCG/2ML IJ SOLN
INTRAMUSCULAR | Status: AC
  Filled 2016-09-19: qty 4

## 2016-09-19 MED ORDER — SODIUM CHLORIDE 0.9 % IV SOLN
INTRAVENOUS | Status: DC
  Administered 2016-09-19: 09:00:00 via INTRAVENOUS

## 2016-09-19 MED ORDER — FENTANYL CITRATE (PF) 100 MCG/2ML IJ SOLN
INTRAMUSCULAR | Status: AC | PRN
  Administered 2016-09-19: 50 ug via INTRAVENOUS

## 2016-09-19 NOTE — Consult Note (Signed)
Chief Complaint: Patient was seen in consultation today for lung biopsy at the request of Oaks,Timothy.  Referring Physician(s): Oaks,Timothy  Patient Status: [facility] - Out-pt  History of Present Illness: Noah Coleman is a 83 y.o. male presenting with a 3 cm cavitary LUL lung mass initially detected by CTA of the head and neck.  CT demonstrated extensive metastatic mediastinal and left hilar adenopathy and pleural nodules.  PET on 12/27 demonstrates metastatic mediastinal and hilar lymphadenopathy, multiple metastatic pleural masses, a left pleural effusion, 12 mm hypermetabolic left para-aortic retroperitoneal soft tissue mass next to abdominal aorta and bone mets in thoracic spine and sacrum without cortical disruption or canal compromise.  Past Medical History:  Diagnosis Date  . Anemia   . BPH (benign prostatic hyperplasia)   . Chronic idiopathic urticaria   . Diverticulosis   . Dupuytren's contracture of right hand   . History of hiatal hernia   . Hyperlipemia   . Shingles     Past Surgical History:  Procedure Laterality Date  . CARDIAC CATHETERIZATION Left 08/12/2016   Procedure: Left Heart Cath and Coronary Angiography;  Surgeon: Teodoro Spray, MD;  Location: [facility];  Service: Cardiovascular;  Laterality: Left;  . COLONOSCOPY    . EUS N/A 02/22/2016   Procedure: UPPER ENDOSCOPIC ULTRASOUND (EUS) LINEAR;  Surgeon: Cora Daniels, MD;  Location: [facility] END[facility];  Service: Endoscopy;  Laterality: N/A;  . TONSILLECTOMY      Allergies: Patient has no known allergies.  Medications: Prior to Admission medications   Medication Sig Start Date End Date Taking? Authorizing Provider  aspirin EC 81 MG tablet Take 1 tablet by mouth 1 day or 1 dose.   Yes Historical Provider, MD  Cyanocobalamin (RA VITAMIN B-12 TR) 1000 MCG TBCR Take 1 tablet by mouth 1 day or 1 dose.   Yes Historical Provider, MD  diphenhydrAMINE (BENADRYL) 25 mg capsule Take 25 mg by mouth  every 6 (six) hours as needed.   Yes Historical Provider, MD  fexofenadine (ALLEGRA) 180 MG tablet Take 1 tablet by mouth 1 day or 1 dose.   Yes Historical Provider, MD  Multiple Vitamin (MULTIVITAMIN) capsule Take 1 capsule by mouth daily.   Yes Historical Provider, MD  Probiotic Product (PROBIOTIC DAILY) CAPS Take 1 capsule by mouth daily.   Yes Historical Provider, MD  doxycycline (MONODOX) 50 MG capsule  08/30/16   Historical Provider, MD     Family History  Problem Relation Age of Onset  . Cancer Mother     unknown type  . Lung cancer Father   . Colon cancer Father     Social History   Social History  . Marital status: Married    Spouse name: N/A  . Number of children: N/A  . Years of education: N/A   Social History Main Topics  . Smoking status: Former Smoker    Years: 30.00    Types: Cigarettes    Quit date: 08/12/1981  . Smokeless tobacco: Never Used  . Alcohol use No  . Drug use: No  . Sexual activity: Not Asked   Other Topics Concern  . None   Social History Narrative  . None    ECOG Status: 0 - Asymptomatic  Review of Systems: A 12 point ROS discussed and pertinent positives are indicated in the HPI above.  All other systems are negative.  Review of Systems  Constitutional: Negative.   HENT: Negative.   Respiratory: Negative.   Cardiovascular: Negative.  Gastrointestinal: Negative.   Genitourinary: Negative.   Musculoskeletal: Negative.   Neurological: Positive for dizziness. Negative for speech difficulty, numbness and headaches.       Has had some episodes of dizziness, fatigue and sweats with walking.  Hematological: Negative.     Vital Signs: BP (!) 159/72   Pulse 85   Temp 97.5 F (36.4 C) (Oral)   Resp 16   Wt 170 lb (77.1 kg)   SpO2 93%   BMI 23.06 kg/m   Physical Exam  Constitutional: He is oriented to person, place, and time. He appears well-developed and well-nourished. No distress.  HENT:  Head: Normocephalic and  atraumatic.  Neck: Neck supple. No JVD present.  Cardiovascular: Normal rate, regular rhythm and normal heart sounds.  Exam reveals no gallop and no friction rub.   No murmur heard. Pulmonary/Chest: Effort normal. No stridor. No respiratory distress. He has no wheezes. He has no rales.  Mildly diminished breath sounds at left base.  Abdominal: Soft. Bowel sounds are normal. He exhibits no distension and no mass. There is no tenderness. There is no rebound and no guarding.  Musculoskeletal: He exhibits no edema.  Neurological: He is alert and oriented to person, place, and time.  Skin: He is not diaphoretic.    Imaging: Ct Chest W Contrast  Result Date: 08/26/2016 CLINICAL DATA:  Abnormal finding on recent CT scan. EXAM: CT CHEST, ABDOMEN, AND PELVIS WITH CONTRAST TECHNIQUE: Multidetector CT imaging of the chest, abdomen and pelvis was performed following the standard protocol during bolus administration of intravenous contrast. CONTRAST:  172m ISOVUE-300 IOPAMIDOL (ISOVUE-300) INJECTION 61% COMPARISON:  CT scan of neck of August 16, 2016. CT scan of abdomen and pelvis of December 21, 2015. CT scan of chest of November 03, 2015. FINDINGS: CT CHEST FINDINGS Cardiovascular: Atherosclerosis of thoracic aorta is noted without aneurysm or dissection. Coronary artery calcifications are noted suggesting coronary artery disease. Mediastinum/Nodes: Extensive mediastinal adenopathy is noted, particularly in the aortopulmonary window, which measures 5.7 x 4.6 cm. Left infrahilar mass measuring 23 x 22 mm is noted. Subcarinal lymph node measuring 22 x 10 mm is noted. Pretracheal left tib measuring 20 x 10 mm is noted. Lungs/Pleura: No pneumothorax is noted. Large irregular pleural-based mass measuring 6.1 x 4.2 x 2.3 cm is noted laterally in left upper lobe concerning for malignancy. Honeycombing and bronchiectasis is noted in both lower lobes consistent with pulmonary fibrosis. Mild left pleural effusion is noted  with multiple small pleural-based nodules concerning for metastatic disease. Musculoskeletal: No chest wall mass or suspicious bone lesions identified. CT ABDOMEN PELVIS FINDINGS Hepatobiliary: No focal liver abnormality is seen. No gallstones, gallbladder wall thickening, or biliary dilatation. Pancreas: Stable 17 mm low density seen in pancreatic head. Spleen: Splenic granulomata are noted. Adrenals/Urinary Tract: Adrenal glands appear normal. Stable exophytic right renal cyst is noted. No hydronephrosis or renal obstruction is noted. No renal or ureteral calculi are noted. Urinary bladder appears normal. Stomach/Bowel: Moderate size hiatal hernia is again noted. The appendix appears normal. There is no evidence of bowel obstruction. Vascular/Lymphatic: Atherosclerosis of abdominal aorta is noted without aneurysm formation. No significant adenopathy is noted in the abdomen or pelvis. Reproductive: Prostate is unremarkable. Other: No abdominal wall hernia or abnormality. No abdominopelvic ascites. Musculoskeletal: Severe degenerative disc disease is noted at L5-S1. IMPRESSION: 6.1 x 4.2 x 2.3 cm mass noted laterally in left upper lobe consistent with malignancy. Mild left pleural effusion is noted with multiple small pleural-based nodules present in the left lung  base concerning for metastatic disease. Extensive mediastinal adenopathy is noted as described above. This is concerning for metastatic disease. Stable moderate size hiatal hernia. Aortic atherosclerosis. Stable 17 mm low density seen in pancreatic head. Recommend follow up pre and post contrast MRI/MRCP or pancreatic protocol CT in 2 years. This recommendation follows ACR consensus guidelines: Management of Incidental Pancreatic Cysts: A White Paper of the ACR Incidental Findings Committee. [place] [id number];76:[id number]. These results will be called to the ordering clinician or representative by the Radiologist Assistant, and communication documented  in the PACS or zVision Dashboard. Electronically Signed   By: Marijo Conception, M.D.   On: 08/26/2016 09:34   Ct Abdomen Pelvis W Contrast  Result Date: 08/26/2016 CLINICAL DATA:  Abnormal finding on recent CT scan. EXAM: CT CHEST, ABDOMEN, AND PELVIS WITH CONTRAST TECHNIQUE: Multidetector CT imaging of the chest, abdomen and pelvis was performed following the standard protocol during bolus administration of intravenous contrast. CONTRAST:  158m ISOVUE-300 IOPAMIDOL (ISOVUE-300) INJECTION 61% COMPARISON:  CT scan of neck of August 16, 2016. CT scan of abdomen and pelvis of December 21, 2015. CT scan of chest of November 03, 2015. FINDINGS: CT CHEST FINDINGS Cardiovascular: Atherosclerosis of thoracic aorta is noted without aneurysm or dissection. Coronary artery calcifications are noted suggesting coronary artery disease. Mediastinum/Nodes: Extensive mediastinal adenopathy is noted, particularly in the aortopulmonary window, which measures 5.7 x 4.6 cm. Left infrahilar mass measuring 23 x 22 mm is noted. Subcarinal lymph node measuring 22 x 10 mm is noted. Pretracheal left tib measuring 20 x 10 mm is noted. Lungs/Pleura: No pneumothorax is noted. Large irregular pleural-based mass measuring 6.1 x 4.2 x 2.3 cm is noted laterally in left upper lobe concerning for malignancy. Honeycombing and bronchiectasis is noted in both lower lobes consistent with pulmonary fibrosis. Mild left pleural effusion is noted with multiple small pleural-based nodules concerning for metastatic disease. Musculoskeletal: No chest wall mass or suspicious bone lesions identified. CT ABDOMEN PELVIS FINDINGS Hepatobiliary: No focal liver abnormality is seen. No gallstones, gallbladder wall thickening, or biliary dilatation. Pancreas: Stable 17 mm low density seen in pancreatic head. Spleen: Splenic granulomata are noted. Adrenals/Urinary Tract: Adrenal glands appear normal. Stable exophytic right renal cyst is noted. No hydronephrosis or renal  obstruction is noted. No renal or ureteral calculi are noted. Urinary bladder appears normal. Stomach/Bowel: Moderate size hiatal hernia is again noted. The appendix appears normal. There is no evidence of bowel obstruction. Vascular/Lymphatic: Atherosclerosis of abdominal aorta is noted without aneurysm formation. No significant adenopathy is noted in the abdomen or pelvis. Reproductive: Prostate is unremarkable. Other: No abdominal wall hernia or abnormality. No abdominopelvic ascites. Musculoskeletal: Severe degenerative disc disease is noted at L5-S1. IMPRESSION: 6.1 x 4.2 x 2.3 cm mass noted laterally in left upper lobe consistent with malignancy. Mild left pleural effusion is noted with multiple small pleural-based nodules present in the left lung base concerning for metastatic disease. Extensive mediastinal adenopathy is noted as described above. This is concerning for metastatic disease. Stable moderate size hiatal hernia. Aortic atherosclerosis. Stable 17 mm low density seen in pancreatic head. Recommend follow up pre and post contrast MRI/MRCP or pancreatic protocol CT in 2 years. This recommendation follows ACR consensus guidelines: Management of Incidental Pancreatic Cysts: A White Paper of the ACR Incidental Findings Committee. [id number];[id number] These results will be called to the ordering clinician or representative by the Radiologist Assistant, and communication documented in the PACS or zVision Dashboard. Electronically Signed  By: Marijo Conception, M.D.   On: 08/26/2016 09:34   Nm Pet Image Initial (pi) Skull Base To Thigh  Result Date: 09/11/2016 CLINICAL DATA:  Initial treatment strategy for left lung mass. EXAM: NUCLEAR MEDICINE PET SKULL BASE TO THIGH TECHNIQUE: 11.13 MCi F-18 FDG was injected intravenously. Full-ring PET imaging was performed from the skull base to thigh after the radiotracer. CT data was obtained and used for attenuation correction and anatomic  localization. FASTING BLOOD GLUCOSE:  Value: 78 mg/dl COMPARISON:  Chest CT 11/03/2015 hand 08/26/2016 FINDINGS: NECK No hypermetabolic lymph nodes in the neck. CHEST 3 cm partially cavitary left upper lobe lung mass is hypermetabolic with SUV max of 16.1. There is also extensive hypermetabolic left-sided mediastinal and hilar lymphadenopathy with SUV max of 16.85. Subcarinal adenopathy is also hypermetabolic with SUV max of 09.6 Extensive metastatic pleural disease with numerous pleural nodules all which are hypermetabolic with SUV max of 04.5. Some of these lesions also appear to be invading the intercostal muscles of the left chest wall. Left-sided pleural effusion is likely malignant. There is also extensive underlying lung disease Several pulmonary nodules in the left upper lobe are also hypermetabolic. The largest nodule measures 10 mm on image number 90 and has an SUV max of 6.95. ABDOMEN/PELVIS 12 mm left-sided retroperitoneal lymph node on image number 409 is hypermetabolic with SUV max of 81.1. No findings for hepatic or adrenal gland metastasis. There is a hypermetabolic lesion in the pancreatic body with an SUV max of 5.4. It is difficult to see this lesion on the CT scan. This is most likely a metastatic focus given the negative MRI abdomen from 02/09/2016. Hypermetabolic focus noted just below the left kidney measures 11 mm. SUV max is 6.8. SKELETON Multiple metastatic bone lesions far identified in the spine and pelvis. No spinal canal compromise is demonstrated. IMPRESSION: 1. 3 cm partially cavitary left upper lobe lung mass is hypermetabolic and consistent with neoplasm. There is also extensive hypermetabolic mediastinal and left hilar adenopathy and extensive pleural disease with numerous hypermetabolic pleural nodules and a malignant pleural effusion. 2. Several small left upper lobe pulmonary nodules are hypermetabolic. 3. Suspect metastatic lesion in the body of the pancreas and also abdominal  implants. 4. Osseous metastatic disease. Electronically Signed   By: Marijo Sanes M.D.   On: 09/11/2016 16:08    Labs:  CBC:  Recent Labs  09/19/16 0730  WBC 7.8  HGB 12.0*  HCT 34.8*  PLT 258    COAGS:  Recent Labs  09/19/16 0730  INR 1.05  APTT 30    BMP:  Recent Labs  11/03/15 0822 12/21/15 1352 08/26/16 0835 09/19/16 0730  NA  --   --   --  138  K  --   --   --  4.0  CL  --   --   --  107  CO2  --   --   --  27  GLUCOSE  --   --   --  99  BUN  --   --   --  21*  CALCIUM  --   --   --  9.1  CREATININE 1.00 1.00 1.10 1.08  GFRNONAA  --   --   --  >60  GFRAA  --   --   --  >60    Assessment and Plan:  I reviewed PET scan results with Noah Coleman and discussed possible sources for tissue biopsy.  The LUL lung mass is  cavitary and would be of somewhat higher risk for inducing hemoptysis with percutaneous biopsy.  The retroperitoneal soft tissue nodule is fairly small and located near the abdominal aorta.  The clearly safest tissue to biopsy is hypermetabolic lower lateral soft tissue masses of the pleura which extend into the intercostal regions.  The patient is agreeable and consent has been obtained for left pleural mass biopsy under CT guidance today.  Thank you for this interesting consult.  I greatly enjoyed meeting Noah Coleman and look forward to participating in their care.  A copy of this report was sent to the requesting provider on this date.  Electronically SignedAletta Edouard T 09/19/2016, 8:34 AM   I spent a total of 30 Minutes in face to face in clinical consultation, greater than 50% of which was counseling/coordinating care for CT guided biopsy.

## 2016-09-19 NOTE — Procedures (Signed)
Interventional Radiology Procedure Note  Procedure: CT guided biopsy of left pleural mass  Complications: None  Estimated Blood Loss: < 10 mL  CT guided core biopsy performed of left inferior pleural mass.  18 G core biopsy x 3 via 17 G needle.    Venetia Night. Kathlene Cote, M.D Pager:  [phone]

## 2016-09-23 ENCOUNTER — Ambulatory Visit
Admission: RE | Admit: 2016-09-23 | Discharge: 2016-09-23 | Disposition: A | Discharge status: Home / Self Care | Payer: Medicare HMO | Source: Ambulatory Visit | Attending: Radiation Oncology | Admitting: Radiation Oncology

## 2016-09-23 ENCOUNTER — Encounter: Payer: Self-pay | Admitting: Radiation Oncology

## 2016-09-23 VITALS — BP 147/83 | HR 85 | Temp 95.7°F | Wt 181.5 lb

## 2016-09-23 DIAGNOSIS — Z7982 Long term (current) use of aspirin: Secondary | ICD-10-CM | POA: Diagnosis not present

## 2016-09-23 DIAGNOSIS — E785 Hyperlipidemia, unspecified: Secondary | ICD-10-CM | POA: Diagnosis not present

## 2016-09-23 DIAGNOSIS — R0602 Shortness of breath: Secondary | ICD-10-CM | POA: Insufficient documentation

## 2016-09-23 DIAGNOSIS — R1900 Intra-abdominal and pelvic swelling, mass and lump, unspecified site: Secondary | ICD-10-CM | POA: Insufficient documentation

## 2016-09-23 DIAGNOSIS — M25552 Pain in left hip: Secondary | ICD-10-CM | POA: Diagnosis not present

## 2016-09-23 DIAGNOSIS — J90 Pleural effusion, not elsewhere classified: Secondary | ICD-10-CM | POA: Insufficient documentation

## 2016-09-23 DIAGNOSIS — R918 Other nonspecific abnormal finding of lung field: Secondary | ICD-10-CM | POA: Insufficient documentation

## 2016-09-23 DIAGNOSIS — Z79899 Other long term (current) drug therapy: Secondary | ICD-10-CM | POA: Insufficient documentation

## 2016-09-23 DIAGNOSIS — Z800 Family history of malignant neoplasm of digestive organs: Secondary | ICD-10-CM | POA: Diagnosis not present

## 2016-09-23 DIAGNOSIS — Z87891 Personal history of nicotine dependence: Secondary | ICD-10-CM | POA: Insufficient documentation

## 2016-09-23 DIAGNOSIS — D649 Anemia, unspecified: Secondary | ICD-10-CM | POA: Insufficient documentation

## 2016-09-23 DIAGNOSIS — Z8719 Personal history of other diseases of the digestive system: Secondary | ICD-10-CM | POA: Insufficient documentation

## 2016-09-23 DIAGNOSIS — M720 Palmar fascial fibromatosis [Dupuytren]: Secondary | ICD-10-CM | POA: Insufficient documentation

## 2016-09-23 DIAGNOSIS — C7931 Secondary malignant neoplasm of brain: Secondary | ICD-10-CM | POA: Insufficient documentation

## 2016-09-23 DIAGNOSIS — R591 Generalized enlarged lymph nodes: Secondary | ICD-10-CM | POA: Insufficient documentation

## 2016-09-23 DIAGNOSIS — L501 Idiopathic urticaria: Secondary | ICD-10-CM | POA: Insufficient documentation

## 2016-09-23 DIAGNOSIS — C3412 Malignant neoplasm of upper lobe, left bronchus or lung: Secondary | ICD-10-CM | POA: Diagnosis not present

## 2016-09-23 DIAGNOSIS — Z510 Encounter for antineoplastic radiation therapy: Secondary | ICD-10-CM | POA: Diagnosis present

## 2016-09-23 DIAGNOSIS — N400 Enlarged prostate without lower urinary tract symptoms: Secondary | ICD-10-CM | POA: Diagnosis not present

## 2016-09-23 DIAGNOSIS — R531 Weakness: Secondary | ICD-10-CM | POA: Diagnosis not present

## 2016-09-23 DIAGNOSIS — Z801 Family history of malignant neoplasm of trachea, bronchus and lung: Secondary | ICD-10-CM | POA: Diagnosis not present

## 2016-09-23 LAB — SURGICAL PATHOLOGY

## 2016-09-23 NOTE — Consult Note (Signed)
NEW PATIENT EVALUATION  Name: Noah Coleman  MRN: [id number]  Date:   09/23/2016     DOB: 02/14/1930   This 83 y.o. male patient presents to the clinic for initial evaluation of stage IV lung cancer.  REFERRING PHYSICIAN: Rusty Aus, MD  CHIEF COMPLAINT:  Chief Complaint  Patient presents with  . Lung Mass    Initial evaluation for treatment    DIAGNOSIS: The encounter diagnosis was Mass of upper lobe of left lung.   PREVIOUS INVESTIGATIONS:  PET CT and CT scans reviewed Pathology report pending Clinical notes reviewed  HPI: Patient is an 83 year old male who had a sudden onset while he was doing his daily 2 mile walk of weakness and shortness of breath. Workup per cardiology did not show any etiology CT scan was performed showing a large 5 cm left upper lobe mass with prominent mediastinal lymphadenopathy. He underwent CT scan guided biopsy of this mass last week with pathology pending. Unfortunately PET CT scan demonstrated hypermetabolic activity in a 3 cm partially cavitary left upper lobe mass with extensive hypermetabolic mediastinal and left hilar adenopathy. There is also extensive pleural disease and a malignant pleural effusion. He also had several left upper lobe pulmonary nodules which were hypermetabolic as well as bony involvement and involvement of the body the pancreas and abdominal implants. Interestingly the patient is almost completely asymptomatic specifically denies bone pain productive cough difficulty ambulating hemoptysis. He is seen today for consultation.  PLANNED TREATMENT REGIMEN: Initial systemic chemotherapy  PAST MEDICAL HISTORY:  has a past medical history of Anemia; BPH (benign prostatic hyperplasia); Chronic idiopathic urticaria; Diverticulosis; Dupuytren's contracture of right hand; History of hiatal hernia; Hyperlipemia; and Shingles.    PAST SURGICAL HISTORY:  Past Surgical History:  Procedure Laterality Date  . CARDIAC CATHETERIZATION Left  08/12/2016   Procedure: Left Heart Cath and Coronary Angiography;  Surgeon: Teodoro Spray, MD;  Location: [facility];  Service: Cardiovascular;  Laterality: Left;  . COLONOSCOPY    . EUS N/A 02/22/2016   Procedure: UPPER ENDOSCOPIC ULTRASOUND (EUS) LINEAR;  Surgeon: Cora Daniels, MD;  Location: [facility] ENDOSCOPY;  Service: Endoscopy;  Laterality: N/A;  . TONSILLECTOMY      FAMILY HISTORY: family history includes Cancer in his mother; Colon cancer in his father; Lung cancer in his father.  SOCIAL HISTORY:  reports that he quit smoking about 35 years ago. His smoking use included Cigarettes. He quit after 30.00 years of use. He has never used smokeless tobacco. He reports that he does not drink alcohol or use drugs.  ALLERGIES: Patient has no known allergies.  MEDICATIONS:  Current Outpatient Prescriptions  Medication Sig Dispense Refill  . aspirin EC 81 MG tablet Take 1 tablet by mouth 1 day or 1 dose.    . Cyanocobalamin (RA VITAMIN B-12 TR) 1000 MCG TBCR Take 1 tablet by mouth 1 day or 1 dose.    . diphenhydrAMINE (BENADRYL) 25 mg capsule Take 25 mg by mouth every 6 (six) hours as needed.    . doxycycline (MONODOX) 50 MG capsule     . fexofenadine (ALLEGRA) 180 MG tablet Take 1 tablet by mouth 1 day or 1 dose.    . Multiple Vitamin (MULTIVITAMIN) capsule Take 1 capsule by mouth daily.    . Probiotic Product (PROBIOTIC DAILY) CAPS Take 1 capsule by mouth daily.     No current facility-administered medications for this encounter.    Facility-Administered Medications Ordered in Other Encounters  Medication Dose  Route Frequency Provider Last Rate Last Dose  . 0.9 %  sodium chloride infusion  250 mL Intravenous PRN Teodoro Spray, MD      . 0.9 %  sodium chloride infusion  250 mL Intravenous PRN Teodoro Spray, MD      . 0.9% sodium chloride infusion  1 mL/kg/hr Intravenous Continuous Teodoro Spray, MD      . 0.9% sodium chloride infusion  1 mL/kg/hr Intravenous Continuous  Teodoro Spray, MD      . sodium chloride flush (NS) 0.9 % injection 3 mL  3 mL Intravenous Q12H Teodoro Spray, MD      . sodium chloride flush (NS) 0.9 % injection 3 mL  3 mL Intravenous PRN Teodoro Spray, MD      . sodium chloride flush (NS) 0.9 % injection 3 mL  3 mL Intravenous Q12H Teodoro Spray, MD      . sodium chloride flush (NS) 0.9 % injection 3 mL  3 mL Intravenous PRN Teodoro Spray, MD        ECOG PERFORMANCE STATUS:  0 - Asymptomatic  REVIEW OF SYSTEMS:  Patient denies any weight loss, fatigue, weakness, fever, chills or night sweats. Patient denies any loss of vision, blurred vision. Patient denies any ringing  of the ears or hearing loss. No irregular heartbeat. Patient denies heart murmur or history of fainting. Patient denies any chest pain or pain radiating to her upper extremities. Patient denies any shortness of breath, difficulty breathing at night, cough or hemoptysis. Patient denies any swelling in the lower legs. Patient denies any nausea vomiting, vomiting of blood, or coffee ground material in the vomitus. Patient denies any stomach pain. Patient states has had normal bowel movements no significant constipation or diarrhea. Patient denies any dysuria, hematuria or significant nocturia. Patient denies any problems walking, swelling in the joints or loss of balance. Patient denies any skin changes, loss of hair or loss of weight. Patient denies any excessive worrying or anxiety or significant depression. Patient denies any problems with insomnia. Patient denies excessive thirst, polyuria, polydipsia. Patient denies any swollen glands, patient denies easy bruising or easy bleeding. Patient denies any recent infections, allergies or URI. Patient "s visual fields have not changed significantly in recent time.    PHYSICAL EXAM: BP (!) 147/83   Pulse 85   Temp (!) 95.7 F (35.4 C)   Wt 181 lb 8.8 oz (82.3 kg)   BMI 24.62 kg/m  Well-developed well-nourished patient in NAD.  HEENT reveals PERLA, EOMI, discs not visualized.  Oral cavity is clear. No oral mucosal lesions are identified. Neck is clear without evidence of cervical or supraclavicular adenopathy. Lungs are clear to A&P. Cardiac examination is essentially unremarkable with regular rate and rhythm without murmur rub or thrill. Abdomen is benign with no organomegaly or masses noted. Motor sensory and DTR levels are equal and symmetric in the upper and lower extremities. Cranial nerves II through XII are grossly intact. Proprioception is intact. No peripheral adenopathy or edema is identified. No motor or sensory levels are noted. Crude visual fields are within normal range.  LABORATORY DATA: Pathology reports pending    RADIOLOGY RESULTS: CT scans and PET/CT scans reviewed compatible with the above-stated findings   IMPRESSION: Stage IV probable lung cancer with extensive disease throughout chest bone and abdominal cavity in 83 year old male  PLAN: At this time I discussed the case with medical oncology. I see no role at this time for palliative  radiation therapy. I would anticipate probable systemic chemotherapy or immunotherapy as initial course of treatment. I would be happy to reevaluate the patient for possible palliative radiation therapy to both his chest or bone metastasis should they become problematic. All of this was explained that an initial basis with the patient. I have set up follow-up this week with medical oncology and discussed the case personally with him.  I would like to take this opportunity to thank you for allowing me to participate in the care of your patient.Armstead Peaks., MD

## 2016-09-25 NOTE — Progress Notes (Signed)
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  INTERVAL HISTORY: Patient returns to clinic today for further evaluation, discussion of his biopsy and imaging results, and treatment planning. He continues to feel well and is asymptomatic. He has no neurologic complaints. He denies any recent fevers or illnesses. He has a good appetite and denies weight loss. He has no chest pain, hemoptysis, or cough. He denies any further shortness of breath. He has no nausea, vomiting, constipation, or diarrhea. He has no urinary complaints. Patient feels at his baseline and offers no specific complaints today.  REVIEW OF SYSTEMS:   Review of Systems  Constitutional: Negative.  Negative for fever, malaise/fatigue and weight loss.  Respiratory: Negative.  Negative for cough, hemoptysis and shortness of breath.   Cardiovascular: Negative.  Negative for chest pain and leg swelling.  Gastrointestinal: Negative.  Negative for abdominal pain.  Genitourinary: Negative.   Musculoskeletal: Negative.   Neurological: Negative.  Negative for weakness.  Psychiatric/Behavioral: Negative.  The patient is not nervous/anxious.     As per HPI. Otherwise, a complete review of systems is negative.  PAST MEDICAL HISTORY: Past Medical History:  Diagnosis Date  . Anemia   . BPH (benign prostatic hyperplasia)   . Chronic idiopathic urticaria   . Diverticulosis   . Dupuytren's contracture of right hand   . History of hiatal hernia   . Hyperlipemia   . Shingles     PAST SURGICAL HISTORY: Past Surgical History:  Procedure Laterality Date  . CARDIAC CATHETERIZATION Left 08/12/2016   Procedure: Left Heart Cath and Coronary Angiography;  Surgeon: Teodoro Spray,  MD;  Location: [facility];  Service: Cardiovascular;  Laterality: Left;  . COLONOSCOPY    . EUS N/A 02/22/2016   Procedure: UPPER ENDOSCOPIC ULTRASOUND (EUS) LINEAR;  Surgeon: Cora Daniels, MD;  Location: [facility];  Service: Endoscopy;  Laterality: N/A;  . TONSILLECTOMY      FAMILY HISTORY: Family History  Problem Relation Age of Onset  . Cancer Mother     unknown type  . Lung cancer Father   . Colon cancer Father     ADVANCED DIRECTIVES (Y/N):  N  HEALTH MAINTENANCE: Social History  Substance Use Topics  . Smoking status: Former Smoker    Years: 30.00    Types: Cigarettes    Quit date: 08/12/1981  . Smokeless tobacco: Never Used  . Alcohol use No     Colonoscopy:  PAP:  Bone density:  Lipid panel:  No Known Allergies  Current Outpatient Prescriptions  Medication Sig Dispense Refill  . aspirin EC 81 MG tablet Take 1 tablet by mouth 1 day or 1 dose.    . Cyanocobalamin (RA VITAMIN B-12 TR) 1000 MCG TBCR Take 1 tablet by mouth 1 day or 1 dose.    . diphenhydrAMINE (BENADRYL) 25 mg capsule Take 25 mg by mouth every 6 (six) hours as needed.    . fexofenadine (ALLEGRA) 180 MG tablet Take 1 tablet by mouth 1 day or 1 dose.    . Multiple Vitamin (MULTIVITAMIN) capsule Take 1 capsule by mouth daily.    . Probiotic Product (PROBIOTIC DAILY) CAPS Take 1 capsule by mouth daily.     No current facility-administered medications for this visit.  Facility-Administered Medications Ordered in Other Visits  Medication Dose Route Frequency Provider Last Rate Last Dose  . 0.9 %  sodium chloride infusion  250 mL Intravenous PRN Teodoro Spray, MD      . 0.9 %  sodium chloride infusion  250 mL Intravenous PRN Teodoro Spray, MD      . 0.9% sodium chloride infusion  1 mL/kg/hr Intravenous Continuous Teodoro Spray, MD      . 0.9% sodium chloride infusion  1 mL/kg/hr Intravenous Continuous Teodoro Spray, MD      . sodium chloride flush (NS) 0.9 % injection 3 mL   3 mL Intravenous Q12H Teodoro Spray, MD      . sodium chloride flush (NS) 0.9 % injection 3 mL  3 mL Intravenous PRN Teodoro Spray, MD      . sodium chloride flush (NS) 0.9 % injection 3 mL  3 mL Intravenous Q12H Teodoro Spray, MD      . sodium chloride flush (NS) 0.9 % injection 3 mL  3 mL Intravenous PRN Teodoro Spray, MD        OBJECTIVE: Vitals:   09/26/16 1148  BP: 135/76  Pulse: 91  Resp: 18  Temp: 97.9 F (36.6 C)     Body mass index is 24.37 kg/m.    ECOG FS:0 - Asymptomatic  General: Well-developed, well-nourished, no acute distress. Eyes: Pink conjunctiva, anicteric sclera. Lungs: Clear to auscultation bilaterally. Heart: Regular rate and rhythm. No rubs, murmurs, or gallops. Abdomen: Soft, nontender, nondistended. No organomegaly noted, normoactive bowel sounds. Musculoskeletal: No edema, cyanosis, or clubbing. Neuro: Alert, answering all questions appropriately. Cranial nerves grossly intact. Skin: No rashes or petechiae noted. Psych: Normal affect.   LAB RESULTS:  Lab Results  Component Value Date   NA 138 09/19/2016   K 4.0 09/19/2016   CL 107 09/19/2016   CO2 27 09/19/2016   GLUCOSE 99 09/19/2016   BUN 21 (H) 09/19/2016   CREATININE 1.08 09/19/2016   CALCIUM 9.1 09/19/2016   GFRNONAA >60 09/19/2016   GFRAA >60 09/19/2016    Lab Results  Component Value Date   WBC 7.8 09/19/2016   HGB 12.0 (L) 09/19/2016   HCT 34.8 (L) 09/19/2016   MCV 99.0 09/19/2016   PLT 258 09/19/2016     STUDIES: Nm Pet Image Initial (pi) Skull Base To Thigh  Result Date: 09/11/2016 CLINICAL DATA:  Initial treatment strategy for left lung mass. EXAM: NUCLEAR MEDICINE PET SKULL BASE TO THIGH TECHNIQUE: 11.13 MCi F-18 FDG was injected intravenously. Full-ring PET imaging was performed from the skull base to thigh after the radiotracer. CT data was obtained and used for attenuation correction and anatomic localization. FASTING BLOOD GLUCOSE:  Value: 78 mg/dl COMPARISON:   Chest CT 11/03/2015 hand 08/26/2016 FINDINGS: NECK No hypermetabolic lymph nodes in the neck. CHEST 3 cm partially cavitary left upper lobe lung mass is hypermetabolic with SUV max of 59.5. There is also extensive hypermetabolic left-sided mediastinal and hilar lymphadenopathy with SUV max of 16.85. Subcarinal adenopathy is also hypermetabolic with SUV max of 63.8 Extensive metastatic pleural disease with numerous pleural nodules all which are hypermetabolic with SUV max of 75.6. Some of these lesions also appear to be invading the intercostal muscles of the left chest wall. Left-sided pleural effusion is likely malignant. There is also extensive underlying lung disease Several pulmonary nodules in the left upper lobe are also hypermetabolic. The largest nodule measures 10 mm on image number 90 and has  an SUV max of 6.95. ABDOMEN/PELVIS 12 mm left-sided retroperitoneal lymph node on image number 010 is hypermetabolic with SUV max of 27.2. No findings for hepatic or adrenal gland metastasis. There is a hypermetabolic lesion in the pancreatic body with an SUV max of 5.4. It is difficult to see this lesion on the CT scan. This is most likely a metastatic focus given the negative MRI abdomen from 02/09/2016. Hypermetabolic focus noted just below the left kidney measures 11 mm. SUV max is 6.8. SKELETON Multiple metastatic bone lesions far identified in the spine and pelvis. No spinal canal compromise is demonstrated. IMPRESSION: 1. 3 cm partially cavitary left upper lobe lung mass is hypermetabolic and consistent with neoplasm. There is also extensive hypermetabolic mediastinal and left hilar adenopathy and extensive pleural disease with numerous hypermetabolic pleural nodules and a malignant pleural effusion. 2. Several small left upper lobe pulmonary nodules are hypermetabolic. 3. Suspect metastatic lesion in the body of the pancreas and also abdominal implants. 4. Osseous metastatic disease. Electronically Signed    By: Marijo Sanes M.D.   On: 09/11/2016 16:08   Ct Biopsy  Result Date: 09/19/2016 CLINICAL DATA:  Cavitary left upper lobe lung mass with associated extensive mediastinal and hilar lymphadenopathy, pleural metastases, bone metastases and retroperitoneal soft tissue mass. After review of potential areas for tissue biopsy, decision has been made to sample and inferior left lateral pleural mass for tissue diagnosis. EXAM: CT GUIDED CORE BIOPSY OF LEFT PLEURAL MASS COMPARISON:  PET scan on 09/11/2016 ANESTHESIA/SEDATION: 1.0 mg IV Versed; 50 mcg IV Fentanyl Total Moderate Sedation Time:  15 minutes. The patient's level of consciousness and physiologic status were continuously monitored during the procedure by Radiology nursing. PROCEDURE: The procedure risks, benefits, and alternatives were explained to the patient. Questions regarding the procedure were encouraged and answered. The patient understands and consents to the procedure. A time-out was performed prior to initiating the procedure. The inferior left chest wall was prepped with chlorhexidine in a sterile fashion, and a sterile drape was applied covering the operative field. A sterile gown and sterile gloves were used for the procedure. Local anesthesia was provided with 1% Lidocaine. Initial CT was performed in a supine position. Under CT guidance, a 17 gauge trocar needle was advanced to the level of a lateral inferior pleural mass. Three separate 18 gauge core biopsy samples were then obtained through the 17 gauge needle. Material was submitted in formalin. Post biopsy CT images were performed after outer needle removal. COMPLICATIONS: None FINDINGS: Inferior and lateral pleural mass measuring 1.8 x 3.5 cm within the very inferior aspect of the left pleural space was chosen for sampling and demonstrates hypermetabolic activity by recent PET scan. Solid tissue was obtained.  The were no complications. IMPRESSION: CT-guided core biopsy performed of inferior  left lateral pleural mass. Electronically Signed   By: Aletta Edouard M.D.   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  PLAN:    1. 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 Biopsy confirmed stage IV disease with bony metastasis. After lengthy discussion with the patient, he wishes to pursue palliative chemotherapy with carboplatinum and etoposide. Given his advanced age, will dose reduce etoposide.  Patient will receive carboplatinum and etoposide on day 1 and then etoposide only on days 2 and 3. He will also receive Onpro Neulasta support on day  3 of each cycle. He will also receive Zometa approximately every 4 weeks for his bony metastasis.  Patient has declined port placement at this time, but will reconsider for cycle 2 if he tolerates the treatment well and wishes to continue. He has expressed interest in continued quality of life over quantity of life.  Will get a brain MRI in the next 1-2 weeks to complete the staging workup. 2. Hypertension: Patient's blood pressure is mildly elevated today, monitor.  Approximately 30 minutes was spent in discussion of which greater than 50% was consultation.  Patient expressed understanding and was in agreement with this plan. He also understands that He can call clinic at any time with any questions, concerns, or complaints.   Small cell lung cancer, left ([place])   Staging form: Lung, AJCC 7th Edition   - Clinical stage from 09/25/2016: Stage IV (T2, N2, M1b) - Signed by Lloyd Huger, MD on 09/25/2016  Lloyd Huger, MD   09/27/2016 9:45 AM

## 2016-09-26 ENCOUNTER — Inpatient Hospital Stay: Payer: Medicare HMO | Attending: Oncology | Admitting: Oncology

## 2016-09-26 VITALS — BP 135/76 | HR 91 | Temp 97.9°F | Resp 18 | Wt 179.7 lb

## 2016-09-26 DIAGNOSIS — C7931 Secondary malignant neoplasm of brain: Secondary | ICD-10-CM | POA: Insufficient documentation

## 2016-09-26 DIAGNOSIS — E785 Hyperlipidemia, unspecified: Secondary | ICD-10-CM | POA: Diagnosis not present

## 2016-09-26 DIAGNOSIS — Z7689 Persons encountering health services in other specified circumstances: Secondary | ICD-10-CM | POA: Diagnosis not present

## 2016-09-26 DIAGNOSIS — Z87891 Personal history of nicotine dependence: Secondary | ICD-10-CM | POA: Diagnosis not present

## 2016-09-26 DIAGNOSIS — Z7982 Long term (current) use of aspirin: Secondary | ICD-10-CM | POA: Diagnosis not present

## 2016-09-26 DIAGNOSIS — C3492 Malignant neoplasm of unspecified part of left bronchus or lung: Secondary | ICD-10-CM

## 2016-09-26 DIAGNOSIS — C7951 Secondary malignant neoplasm of bone: Secondary | ICD-10-CM | POA: Diagnosis not present

## 2016-09-26 DIAGNOSIS — Z5111 Encounter for antineoplastic chemotherapy: Secondary | ICD-10-CM | POA: Insufficient documentation

## 2016-09-26 DIAGNOSIS — C3412 Malignant neoplasm of upper lobe, left bronchus or lung: Secondary | ICD-10-CM | POA: Insufficient documentation

## 2016-09-26 DIAGNOSIS — Z79899 Other long term (current) drug therapy: Secondary | ICD-10-CM

## 2016-09-26 DIAGNOSIS — R0602 Shortness of breath: Secondary | ICD-10-CM | POA: Insufficient documentation

## 2016-09-26 DIAGNOSIS — I10 Essential (primary) hypertension: Secondary | ICD-10-CM | POA: Insufficient documentation

## 2016-09-26 DIAGNOSIS — Z7189 Other specified counseling: Secondary | ICD-10-CM

## 2016-09-26 NOTE — Progress Notes (Signed)
Complains of constant cough that occassionally produces clear/white mucus.

## 2016-09-26 NOTE — Patient Instructions (Signed)
Etoposide, VP-16 injection What is this medicine? ETOPOSIDE, VP-16 (e toe POE side) is a chemotherapy drug. It is used to treat testicular cancer, lung cancer, and other cancers. This medicine may be used for other purposes; ask your health care provider or pharmacist if you have questions. COMMON BRAND NAME(S): Etopophos, Toposar, VePesid What should I tell my health care provider before I take this medicine? They need to know if you have any of these conditions: -infection -kidney disease -liver disease -low blood counts, like low white cell, platelet, or red cell counts -an unusual or allergic reaction to etoposide, other medicines, foods, dyes, or preservatives -pregnant or trying to get pregnant -breast-feeding How should I use this medicine? This medicine is for infusion into a vein. It is administered in a hospital or clinic by a specially trained health care professional. Talk to your pediatrician regarding the use of this medicine in children. Special care may be needed. Overdosage: If you think you have taken too much of this medicine contact a poison control center or emergency room at once. NOTE: This medicine is only for you. Do not share this medicine with others. What if I miss a dose? It is important not to miss your dose. Call your doctor or health care professional if you are unable to keep an appointment. What may interact with this medicine? -aspirin -certain medications for seizures like carbamazepine, phenobarbital, phenytoin, valproic acid -cyclosporine -levamisole -warfarin This list may not describe all possible interactions. Give your health care provider a list of all the medicines, herbs, non-prescription drugs, or dietary supplements you use. Also tell them if you smoke, drink alcohol, or use illegal drugs. Some items may interact with your medicine. What should I watch for while using this medicine? Visit your doctor for checks on your progress. This drug  may make you feel generally unwell. This is not uncommon, as chemotherapy can affect healthy cells as well as cancer cells. Report any side effects. Continue your course of treatment even though you feel ill unless your doctor tells you to stop. In some cases, you may be given additional medicines to help with side effects. Follow all directions for their use. Call your doctor or health care professional for advice if you get a fever, chills or sore throat, or other symptoms of a cold or flu. Do not treat yourself. This drug decreases your body's ability to fight infections. Try to avoid being around people who are sick. This medicine may increase your risk to bruise or bleed. Call your doctor or health care professional if you notice any unusual bleeding. Talk to your doctor about your risk of cancer. You may be more at risk for certain types of cancers if you take this medicine. Do not become pregnant while taking this medicine or for at least 6 months after stopping it. Women should inform their doctor if they wish to become pregnant or think they might be pregnant. Women of child-bearing potential will need to have a negative pregnancy test before starting this medicine. There is a potential for serious side effects to an unborn child. Talk to your health care professional or pharmacist for more information. Do not breast-feed an infant while taking this medicine. Men must use a latex condom during sexual contact with a woman while taking this medicine and for at least 4 months after stopping it. A latex condom is needed even if you have had a vasectomy. Contact your doctor right away if your partner becomes pregnant. Do   not donate sperm while taking this medicine and for at least 4 months after you stop taking this medicine. Men should inform their doctors if they wish to father a child. This medicine may lower sperm counts. What side effects may I notice from receiving this medicine? Side effects that  you should report to your doctor or health care professional as soon as possible: -allergic reactions like skin rash, itching or hives, swelling of the face, lips, or tongue -low blood counts - this medicine may decrease the number of white blood cells, red blood cells and platelets. You may be at increased risk for infections and bleeding. -signs of infection - fever or chills, cough, sore throat, pain or difficulty passing urine -signs of decreased platelets or bleeding - bruising, pinpoint red spots on the skin, black, tarry stools, blood in the urine -signs of decreased red blood cells - unusually weak or tired, fainting spells, lightheadedness -breathing problems -changes in vision -mouth or throat sores or ulcers -pain, redness, swelling or irritation at the injection site -pain, tingling, numbness in the hands or feet -redness, blistering, peeling or loosening of the skin, including inside the mouth -seizures -vomiting Side effects that usually do not require medical attention (report to your doctor or health care professional if they continue or are bothersome): -diarrhea -hair loss -loss of appetite -nausea -stomach pain This list may not describe all possible side effects. Call your doctor for medical advice about side effects. You may report side effects to FDA at 1-800-FDA-1088. Where should I keep my medicine? This drug is given in a hospital or clinic and will not be stored at home. NOTE: This sheet is a summary. It may not cover all possible information. If you have questions about this medicine, talk to your doctor, pharmacist, or health care provider.  2017 Else[employer]/Gold Standard (2015-08-25 11:53:23) Carboplatin injection What is this medicine? CARBOPLATIN (KAR boe pla tin) is a chemotherapy drug. It targets fast dividing cells, like cancer cells, and causes these cells to die. This medicine is used to treat ovarian cancer and many other cancers. This medicine may be  used for other purposes; ask your health care provider or pharmacist if you have questions. COMMON BRAND NAME(S): Paraplatin What should I tell my health care provider before I take this medicine? They need to know if you have any of these conditions: -blood disorders -hearing problems -kidney disease -recent or ongoing radiation therapy -an unusual or allergic reaction to carboplatin, cisplatin, other chemotherapy, other medicines, foods, dyes, or preservatives -pregnant or trying to get pregnant -breast-feeding How should I use this medicine? This drug is usually given as an infusion into a vein. It is administered in a hospital or clinic by a specially trained health care professional. Talk to your pediatrician regarding the use of this medicine in children. Special care may be needed. Overdosage: If you think you have taken too much of this medicine contact a poison control center or emergency room at once. NOTE: This medicine is only for you. Do not share this medicine with others. What if I miss a dose? It is important not to miss a dose. Call your doctor or health care professional if you are unable to keep an appointment. What may interact with this medicine? -medicines for seizures -medicines to increase blood counts like filgrastim, pegfilgrastim, sargramostim -some antibiotics like amikacin, gentamicin, neomycin, streptomycin, tobramycin -vaccines Talk to your doctor or health care professional before taking any of these medicines: -acetaminophen -aspirin -ibuprofen -ketoprofen -naproxen  This list may not describe all possible interactions. Give your health care provider a list of all the medicines, herbs, non-prescription drugs, or dietary supplements you use. Also tell them if you smoke, drink alcohol, or use illegal drugs. Some items may interact with your medicine. What should I watch for while using this medicine? Your condition will be monitored carefully while you are  receiving this medicine. You will need important blood work done while you are taking this medicine. This drug may make you feel generally unwell. This is not uncommon, as chemotherapy can affect healthy cells as well as cancer cells. Report any side effects. Continue your course of treatment even though you feel ill unless your doctor tells you to stop. In some cases, you may be given additional medicines to help with side effects. Follow all directions for their use. Call your doctor or health care professional for advice if you get a fever, chills or sore throat, or other symptoms of a cold or flu. Do not treat yourself. This drug decreases your body's ability to fight infections. Try to avoid being around people who are sick. This medicine may increase your risk to bruise or bleed. Call your doctor or health care professional if you notice any unusual bleeding. Be careful brushing and flossing your teeth or using a toothpick because you may get an infection or bleed more easily. If you have any dental work done, tell your dentist you are receiving this medicine. Avoid taking products that contain aspirin, acetaminophen, ibuprofen, naproxen, or ketoprofen unless instructed by your doctor. These medicines may hide a fever. Do not become pregnant while taking this medicine. Women should inform their doctor if they wish to become pregnant or think they might be pregnant. There is a potential for serious side effects to an unborn child. Talk to your health care professional or pharmacist for more information. Do not breast-feed an infant while taking this medicine. What side effects may I notice from receiving this medicine? Side effects that you should report to your doctor or health care professional as soon as possible: -allergic reactions like skin rash, itching or hives, swelling of the face, lips, or tongue -signs of infection - fever or chills, cough, sore throat, pain or difficulty passing  urine -signs of decreased platelets or bleeding - bruising, pinpoint red spots on the skin, black, tarry stools, nosebleeds -signs of decreased red blood cells - unusually weak or tired, fainting spells, lightheadedness -breathing problems -changes in hearing -changes in vision -chest pain -high blood pressure -low blood counts - This drug may decrease the number of white blood cells, red blood cells and platelets. You may be at increased risk for infections and bleeding. -nausea and vomiting -pain, swelling, redness or irritation at the injection site -pain, tingling, numbness in the hands or feet -problems with balance, talking, walking -trouble passing urine or change in the amount of urine Side effects that usually do not require medical attention (report to your doctor or health care professional if they continue or are bothersome): -hair loss -loss of appetite -metallic taste in the mouth or changes in taste This list may not describe all possible side effects. Call your doctor for medical advice about side effects. You may report side effects to FDA at 1-800-FDA-1088. Where should I keep my medicine? This drug is given in a hospital or clinic and will not be stored at home. NOTE: This sheet is a summary. It may not cover all possible information. If you have  questions about this medicine, talk to your doctor, pharmacist, or health care provider.  2017 Elsevier/Gold Standard (2007-12-08 14:38:05) Zoledronic Acid injection (Hypercalcemia, Oncology) What is this medicine? ZOLEDRONIC ACID (ZOE le dron ik AS id) lowers the amount of calcium loss from bone. It is used to treat too much calcium in your blood from cancer. It is also used to prevent complications of cancer that has spread to the bone. This medicine may be used for other purposes; ask your health care provider or pharmacist if you have questions. COMMON BRAND NAME(S): Zometa What should I tell my health care provider before  I take this medicine? They need to know if you have any of these conditions: -aspirin-sensitive asthma -cancer, especially if you are receiving medicines used to treat cancer -dental disease or wear dentures -infection -kidney disease -receiving corticosteroids like dexamethasone or prednisone -an unusual or allergic reaction to zoledronic acid, other medicines, foods, dyes, or preservatives -pregnant or trying to get pregnant -breast-feeding How should I use this medicine? This medicine is for infusion into a vein. It is given by a health care professional in a hospital or clinic setting. Talk to your pediatrician regarding the use of this medicine in children. Special care may be needed. Overdosage: If you think you have taken too much of this medicine contact a poison control center or emergency room at once. NOTE: This medicine is only for you. Do not share this medicine with others. What if I miss a dose? It is important not to miss your dose. Call your doctor or health care professional if you are unable to keep an appointment. What may interact with this medicine? -certain antibiotics given by injection -NSAIDs, medicines for pain and inflammation, like ibuprofen or naproxen -some diuretics like bumetanide, furosemide -teriparatide -thalidomide This list may not describe all possible interactions. Give your health care provider a list of all the medicines, herbs, non-prescription drugs, or dietary supplements you use. Also tell them if you smoke, drink alcohol, or use illegal drugs. Some items may interact with your medicine. What should I watch for while using this medicine? Visit your doctor or health care professional for regular checkups. It may be some time before you see the benefit from this medicine. Do not stop taking your medicine unless your doctor tells you to. Your doctor may order blood tests or other tests to see how you are doing. Women should inform their doctor if  they wish to become pregnant or think they might be pregnant. There is a potential for serious side effects to an unborn child. Talk to your health care professional or pharmacist for more information. You should make sure that you get enough calcium and vitamin D while you are taking this medicine. Discuss the foods you eat and the vitamins you take with your health care professional. Some people who take this medicine have severe bone, joint, and/or muscle pain. This medicine may also increase your risk for jaw problems or a broken thigh bone. Tell your doctor right away if you have severe pain in your jaw, bones, joints, or muscles. Tell your doctor if you have any pain that does not go away or that gets worse. Tell your dentist and dental surgeon that you are taking this medicine. You should not have major dental surgery while on this medicine. See your dentist to have a dental exam and fix any dental problems before starting this medicine. Take good care of your teeth while on this medicine. Make sure you  see your dentist for regular follow-up appointments. What side effects may I notice from receiving this medicine? Side effects that you should report to your doctor or health care professional as soon as possible: -allergic reactions like skin rash, itching or hives, swelling of the face, lips, or tongue -anxiety, confusion, or depression -breathing problems -changes in vision -eye pain -feeling faint or lightheaded, falls -jaw pain, especially after dental work -mouth sores -muscle cramps, stiffness, or weakness -redness, blistering, peeling or loosening of the skin, including inside the mouth -trouble passing urine or change in the amount of urine Side effects that usually do not require medical attention (report to your doctor or health care professional if they continue or are bothersome): -bone, joint, or muscle pain -constipation -diarrhea -fever -hair loss -irritation at site  where injected -loss of appetite -nausea, vomiting -stomach upset -trouble sleeping -trouble swallowing -weak or tired This list may not describe all possible side effects. Call your doctor for medical advice about side effects. You may report side effects to FDA at 1-800-FDA-1088. Where should I keep my medicine? This drug is given in a hospital or clinic and will not be stored at home. NOTE: This sheet is a summary. It may not cover all possible information. If you have questions about this medicine, talk to your doctor, pharmacist, or health care provider.  2017 [employer]vier/Gold Standard (2014-01-29 14:19:39)

## 2016-09-27 ENCOUNTER — Ambulatory Visit: Payer: Self-pay | Admitting: Cardiothoracic Surgery

## 2016-09-27 DIAGNOSIS — Z7189 Other specified counseling: Secondary | ICD-10-CM | POA: Insufficient documentation

## 2016-09-27 MED ORDER — PROCHLORPERAZINE MALEATE 10 MG PO TABS: 10.0000 mg | ORAL_TABLET | Freq: Four times a day (QID) | ORAL | 1 refills | Status: DC | PRN

## 2016-09-27 MED ORDER — ONDANSETRON HCL 8 MG PO TABS: 8.0000 mg | ORAL_TABLET | Freq: Two times a day (BID) | ORAL | 1 refills | Status: DC | PRN

## 2016-09-27 NOTE — Progress Notes (Signed)
START ON PATHWAY REGIMEN - Small Cell Lung  [id number]: Etoposide 100 mg/m2 Days 1, 2, 3 + Carboplatin AUC=5 Day 1 q21 Days x 4 Cycles   A cycle is every 21 days:     Etoposide (Toposar(R)) 100 mg/m2 in 500 mL NS IV over 2 hours days 1-3 Dose Mod: None     Carboplatin (Paraplatin(R)) AUC=5 in 500 mL NS IV over 1 hour day 1 only Dose Mod: None Additional Orders: * All AUC calculations intended to be used in [employer] formula  **Always confirm dose/schedule in your pharmacy ordering system**    Patient Characteristics: Extensive Stage, First Line Check here if patient was staged using an edition prior to AJCC Staging - 8th Edition (i.e., prior to September 16, 2016)? false Stage Grouping: Extensive AJCC T Category: T2 AJCC N Category: N2 AJCC M Category: M1b AJCC 8 Stage Grouping: IVA Line of therapy: First Line Would you be surprised if this patient died  in the next year? I would be surprised if this patient died in the next year  Intent of Therapy: Non-Curative / Palliative Intent, Discussed with Patient

## 2016-09-30 NOTE — Progress Notes (Deleted)
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  INTERVAL HISTORY: Patient returns to clinic today for further evaluation, discussion of his biopsy and imaging results, and treatment planning. He continues to feel well and is asymptomatic. He has no neurologic complaints. He denies any recent fevers or illnesses. He has a good appetite and denies weight loss. He has no chest pain, hemoptysis, or cough. He denies any further shortness of breath. He has no nausea, vomiting, constipation, or diarrhea. He has no urinary complaints. Patient feels at his baseline and offers no specific complaints today.  REVIEW OF SYSTEMS:   Review of Systems  Constitutional: Negative.  Negative for fever, malaise/fatigue and weight loss.  Respiratory: Negative.  Negative for cough, hemoptysis and shortness of breath.   Cardiovascular: Negative.  Negative for chest pain and leg swelling.  Gastrointestinal: Negative.  Negative for abdominal pain.  Genitourinary: Negative.   Musculoskeletal: Negative.   Neurological: Negative.  Negative for weakness.  Psychiatric/Behavioral: Negative.  The patient is not nervous/anxious.     As per HPI. Otherwise, a complete review of systems is negative.  PAST MEDICAL HISTORY: Past Medical History:  Diagnosis Date  . Anemia   . BPH (benign prostatic hyperplasia)   . Chronic idiopathic urticaria   . Diverticulosis   . Dupuytren's contracture of right hand   . History of hiatal hernia   . Hyperlipemia   . Shingles     PAST SURGICAL HISTORY: Past Surgical History:  Procedure Laterality Date  . CARDIAC CATHETERIZATION Left 08/12/2016   Procedure: Left Heart Cath and Coronary Angiography;  Surgeon: Teodoro Spray,  MD;  Location: [facility];  Service: Cardiovascular;  Laterality: Left;  . COLONOSCOPY    . EUS N/A 02/22/2016   Procedure: UPPER ENDOSCOPIC ULTRASOUND (EUS) LINEAR;  Surgeon: Cora Daniels, MD;  Location: [facility];  Service: Endoscopy;  Laterality: N/A;  . TONSILLECTOMY      FAMILY HISTORY: Family History  Problem Relation Age of Onset  . Cancer Mother     unknown type  . Lung cancer Father   . Colon cancer Father     ADVANCED DIRECTIVES (Y/N):  N  HEALTH MAINTENANCE: Social History  Substance Use Topics  . Smoking status: Former Smoker    Years: 30.00    Types: Cigarettes    Quit date: 08/12/1981  . Smokeless tobacco: Never Used  . Alcohol use No     Colonoscopy:  PAP:  Bone density:  Lipid panel:  No Known Allergies  Current Outpatient Prescriptions  Medication Sig Dispense Refill  . aspirin EC 81 MG tablet Take 1 tablet by mouth 1 day or 1 dose.    . Cyanocobalamin (RA VITAMIN B-12 TR) 1000 MCG TBCR Take 1 tablet by mouth 1 day or 1 dose.    . diphenhydrAMINE (BENADRYL) 25 mg capsule Take 25 mg by mouth every 6 (six) hours as needed.    . fexofenadine (ALLEGRA) 180 MG tablet Take 1 tablet by mouth 1 day or 1 dose.    . Multiple Vitamin (MULTIVITAMIN) capsule Take 1 capsule by mouth daily.    . ondansetron (ZOFRAN) 8 MG tablet Take 1 tablet (8 mg total) by mouth 2 (two) times daily as needed for refractory nausea /  vomiting. 30 tablet 1  . Probiotic Product (PROBIOTIC DAILY) CAPS Take 1 capsule by mouth daily.    . prochlorperazine (COMPAZINE) 10 MG tablet Take 1 tablet (10 mg total) by mouth every 6 (six) hours as needed (Nausea or vomiting). 30 tablet 1   No current facility-administered medications for this visit.    Facility-Administered Medications Ordered in Other Visits  Medication Dose Route Frequency Provider Last Rate Last Dose  . 0.9 %  sodium chloride infusion  250 mL Intravenous PRN Teodoro Spray, MD      . 0.9 %  sodium  chloride infusion  250 mL Intravenous PRN Teodoro Spray, MD      . 0.9% sodium chloride infusion  1 mL/kg/hr Intravenous Continuous Teodoro Spray, MD      . 0.9% sodium chloride infusion  1 mL/kg/hr Intravenous Continuous Teodoro Spray, MD      . sodium chloride flush (NS) 0.9 % injection 3 mL  3 mL Intravenous Q12H Teodoro Spray, MD      . sodium chloride flush (NS) 0.9 % injection 3 mL  3 mL Intravenous PRN Teodoro Spray, MD      . sodium chloride flush (NS) 0.9 % injection 3 mL  3 mL Intravenous Q12H Teodoro Spray, MD      . sodium chloride flush (NS) 0.9 % injection 3 mL  3 mL Intravenous PRN Teodoro Spray, MD        OBJECTIVE: There were no vitals filed for this visit.   There is no height or weight on file to calculate BMI.    ECOG FS:0 - Asymptomatic  General: Well-developed, well-nourished, no acute distress. Eyes: Pink conjunctiva, anicteric sclera. Lungs: Clear to auscultation bilaterally. Heart: Regular rate and rhythm. No rubs, murmurs, or gallops. Abdomen: Soft, nontender, nondistended. No organomegaly noted, normoactive bowel sounds. Musculoskeletal: No edema, cyanosis, or clubbing. Neuro: Alert, answering all questions appropriately. Cranial nerves grossly intact. Skin: No rashes or petechiae noted. Psych: Normal affect.   LAB RESULTS:  Lab Results  Component Value Date   NA 138 09/19/2016   K 4.0 09/19/2016   CL 107 09/19/2016   CO2 27 09/19/2016   GLUCOSE 99 09/19/2016   BUN 21 (H) 09/19/2016   CREATININE 1.08 09/19/2016   CALCIUM 9.1 09/19/2016   GFRNONAA >60 09/19/2016   GFRAA >60 09/19/2016    Lab Results  Component Value Date   WBC 7.8 09/19/2016   HGB 12.0 (L) 09/19/2016   HCT 34.8 (L) 09/19/2016   MCV 99.0 09/19/2016   PLT 258 09/19/2016     STUDIES: Nm Pet Image Initial (pi) Skull Base To Thigh  Result Date: 09/11/2016 CLINICAL DATA:  Initial treatment strategy for left lung mass. EXAM: NUCLEAR MEDICINE PET SKULL BASE TO THIGH  TECHNIQUE: 11.13 MCi F-18 FDG was injected intravenously. Full-ring PET imaging was performed from the skull base to thigh after the radiotracer. CT data was obtained and used for attenuation correction and anatomic localization. FASTING BLOOD GLUCOSE:  Value: 78 mg/dl COMPARISON:  Chest CT 11/03/2015 hand 08/26/2016 FINDINGS: NECK No hypermetabolic lymph nodes in the neck. CHEST 3 cm partially cavitary left upper lobe lung mass is hypermetabolic with SUV max of 27.0. There is also extensive hypermetabolic left-sided mediastinal and hilar lymphadenopathy with SUV max of 16.85. Subcarinal adenopathy is also hypermetabolic with SUV max of 35.0 Extensive metastatic pleural disease with numerous pleural nodules all which are hypermetabolic with SUV max of 09.3. Some of these lesions  also appear to be invading the intercostal muscles of the left chest wall. Left-sided pleural effusion is likely malignant. There is also extensive underlying lung disease Several pulmonary nodules in the left upper lobe are also hypermetabolic. The largest nodule measures 10 mm on image number 90 and has an SUV max of 6.95. ABDOMEN/PELVIS 12 mm left-sided retroperitoneal lymph node on image number 073 is hypermetabolic with SUV max of 71.0. No findings for hepatic or adrenal gland metastasis. There is a hypermetabolic lesion in the pancreatic body with an SUV max of 5.4. It is difficult to see this lesion on the CT scan. This is most likely a metastatic focus given the negative MRI abdomen from 02/09/2016. Hypermetabolic focus noted just below the left kidney measures 11 mm. SUV max is 6.8. SKELETON Multiple metastatic bone lesions far identified in the spine and pelvis. No spinal canal compromise is demonstrated. IMPRESSION: 1. 3 cm partially cavitary left upper lobe lung mass is hypermetabolic and consistent with neoplasm. There is also extensive hypermetabolic mediastinal and left hilar adenopathy and extensive pleural disease with  numerous hypermetabolic pleural nodules and a malignant pleural effusion. 2. Several small left upper lobe pulmonary nodules are hypermetabolic. 3. Suspect metastatic lesion in the body of the pancreas and also abdominal implants. 4. Osseous metastatic disease. Electronically Signed   By: Marijo Sanes M.D.   On: 09/11/2016 16:08   Ct Biopsy  Result Date: 09/19/2016 CLINICAL DATA:  Cavitary left upper lobe lung mass with associated extensive mediastinal and hilar lymphadenopathy, pleural metastases, bone metastases and retroperitoneal soft tissue mass. After review of potential areas for tissue biopsy, decision has been made to sample and inferior left lateral pleural mass for tissue diagnosis. EXAM: CT GUIDED CORE BIOPSY OF LEFT PLEURAL MASS COMPARISON:  PET scan on 09/11/2016 ANESTHESIA/SEDATION: 1.0 mg IV Versed; 50 mcg IV Fentanyl Total Moderate Sedation Time:  15 minutes. The patient's level of consciousness and physiologic status were continuously monitored during the procedure by Radiology nursing. PROCEDURE: The procedure risks, benefits, and alternatives were explained to the patient. Questions regarding the procedure were encouraged and answered. The patient understands and consents to the procedure. A time-out was performed prior to initiating the procedure. The inferior left chest wall was prepped with chlorhexidine in a sterile fashion, and a sterile drape was applied covering the operative field. A sterile gown and sterile gloves were used for the procedure. Local anesthesia was provided with 1% Lidocaine. Initial CT was performed in a supine position. Under CT guidance, a 17 gauge trocar needle was advanced to the level of a lateral inferior pleural mass. Three separate 18 gauge core biopsy samples were then obtained through the 17 gauge needle. Material was submitted in formalin. Post biopsy CT images were performed after outer needle removal. COMPLICATIONS: None FINDINGS: Inferior and lateral  pleural mass measuring 1.8 x 3.5 cm within the very inferior aspect of the left pleural space was chosen for sampling and demonstrates hypermetabolic activity by recent PET scan. Solid tissue was obtained.  The were no complications. IMPRESSION: CT-guided core biopsy performed of inferior left lateral pleural mass. Electronically Signed   By: Aletta Edouard M.D.   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  PLAN:    1. 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 Biopsy confirmed stage IV disease with bony metastasis. After lengthy discussion  with the patient, he wishes to pursue palliative chemotherapy with carboplatinum and etoposide. Given his advanced age, will dose reduce etoposide.  Patient will receive carboplatinum and etoposide on day 1 and then etoposide only on days 2 and 3. He will also receive Onpro Neulasta support on day 3 of each cycle. He will also receive Zometa approximately every 4 weeks for his bony metastasis.  Patient has declined port placement at this time, but will reconsider for cycle 2 if he tolerates the treatment well and wishes to continue. He has expressed interest in continued quality of life over quantity of life.  Will get a brain MRI in the next 1-2 weeks to complete the staging workup. 2. Hypertension: Patient's blood pressure is mildly elevated today, monitor.  Approximately 30 minutes was spent in discussion of which greater than 50% was consultation.  Patient expressed understanding and was in agreement with this plan. He also understands that He can call clinic at any time with any questions, concerns, or complaints.   Small cell lung cancer, left ([place])   Staging form: Lung, AJCC 7th Edition   - Clinical stage from 09/25/2016: Stage IV (T2, N2, M1b) - Signed by Lloyd Huger, MD on 09/25/2016  Lloyd Huger,  MD   09/30/2016 9:45 PM

## 2016-10-01 ENCOUNTER — Inpatient Hospital Stay: Payer: Medicare HMO

## 2016-10-02 ENCOUNTER — Inpatient Hospital Stay: Payer: Medicare HMO

## 2016-10-02 ENCOUNTER — Inpatient Hospital Stay: Payer: Medicare HMO | Admitting: Oncology

## 2016-10-02 ENCOUNTER — Ambulatory Visit: Payer: Medicare HMO

## 2016-10-03 ENCOUNTER — Inpatient Hospital Stay: Payer: Medicare HMO

## 2016-10-04 ENCOUNTER — Inpatient Hospital Stay: Payer: Medicare HMO

## 2016-10-04 ENCOUNTER — Telehealth: Payer: Self-pay | Admitting: *Deleted

## 2016-10-04 MED ORDER — OXYCODONE-ACETAMINOPHEN 5-325 MG PO TABS: 1.0000 | ORAL_TABLET | ORAL | 0 refills | Status: DC | PRN

## 2016-10-04 NOTE — Telephone Encounter (Signed)
Lower back pain right side over hip rated 14/10. Took Tylenol and went to bed, this morning rates it at 5/10. He has no pain med ordered

## 2016-10-04 NOTE — Telephone Encounter (Signed)
Percocet order per VO Dr Mike Gip. Patient informed

## 2016-10-07 ENCOUNTER — Telehealth: Payer: Self-pay | Admitting: *Deleted

## 2016-10-07 ENCOUNTER — Ambulatory Visit
Admission: RE | Admit: 2016-10-07 | Discharge: 2016-10-07 | Disposition: A | Discharge status: Home / Self Care | Payer: Medicare HMO | Source: Ambulatory Visit | Attending: Oncology | Admitting: Oncology

## 2016-10-07 DIAGNOSIS — C3492 Malignant neoplasm of unspecified part of left bronchus or lung: Secondary | ICD-10-CM | POA: Diagnosis present

## 2016-10-07 DIAGNOSIS — C7931 Secondary malignant neoplasm of brain: Secondary | ICD-10-CM | POA: Diagnosis not present

## 2016-10-07 MED ORDER — GADOBENATE DIMEGLUMINE 529 MG/ML IV SOLN
17.0000 mL | Freq: Once | INTRAVENOUS | Status: AC | PRN
  Administered 2016-10-07: 17 mL via INTRAVENOUS

## 2016-10-07 MED ORDER — OXYCODONE HCL 10 MG PO TABS: 10.0000 mg | ORAL_TABLET | ORAL | 0 refills | Status: DC | PRN

## 2016-10-07 MED ORDER — FENTANYL 25 MCG/HR TD PT72: 25.0000 ug | MEDICATED_PATCH | TRANSDERMAL | 0 refills | Status: DC

## 2016-10-07 NOTE — Telephone Encounter (Signed)
Rx written and patient informed and will pick them up

## 2016-10-07 NOTE — Addendum Note (Signed)
Addended by: Betti Cruz on: 10/07/2016 02:17 PM   Modules accepted: Orders

## 2016-10-07 NOTE — Telephone Encounter (Signed)
  Patient is followed by Dr. Grayland Ormond.  M

## 2016-10-07 NOTE — Telephone Encounter (Signed)
Patient called to report that the percocet was not working and that he was still in terrible pain. Please advise.

## 2016-10-07 NOTE — Telephone Encounter (Signed)
Okay, oxycodone 10 mg every 4-6 hours as needed along with fentanyl patch 25 g every 72 hours.

## 2016-10-08 NOTE — Progress Notes (Signed)
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  INTERVAL HISTORY: Patient returns to clinic today for further evaluation and initiation of cycle 1 of carboplatinum and etoposide. He is noted increasing pain, particularly across his back. He also has worsening dyspnea on exertion. He has no neurologic complaints. He denies any recent fevers or illnesses. He has a good appetite and denies weight loss. He has no chest pain, hemoptysis, or cough. He has no nausea, vomiting, constipation, or diarrhea. He has no urinary complaints. Patient offers no further specific complaints today.  REVIEW OF SYSTEMS:   Review of Systems  Constitutional: Negative.  Negative for fever, malaise/fatigue and weight loss.  Respiratory: Positive for shortness of breath. Negative for cough and hemoptysis.   Cardiovascular: Negative.  Negative for chest pain and leg swelling.  Gastrointestinal: Negative.  Negative for abdominal pain.  Genitourinary: Negative.   Musculoskeletal: Positive for back pain.  Neurological: Negative.  Negative for weakness.  Psychiatric/Behavioral: The patient is nervous/anxious.     As per HPI. Otherwise, a complete review of systems is negative.  PAST MEDICAL HISTORY: Past Medical History:  Diagnosis Date  . Anemia   . BPH (benign prostatic hyperplasia)   . Chronic idiopathic urticaria   . Diverticulosis   . Dupuytren's contracture of right hand   . History of hiatal hernia   . Hyperlipemia   . Shingles     PAST SURGICAL HISTORY: Past Surgical History:  Procedure Laterality Date  . CARDIAC CATHETERIZATION Left 08/12/2016   Procedure: Left Heart Cath and Coronary Angiography;  Surgeon: Teodoro Spray, MD;  Location: [facility];  Service: Cardiovascular;  Laterality: Left;  . COLONOSCOPY    . EUS N/A 02/22/2016   Procedure: UPPER ENDOSCOPIC ULTRASOUND (EUS) LINEAR;  Surgeon: Cora Daniels, MD;  Location: [facility];  Service: Endoscopy;  Laterality: N/A;  . TONSILLECTOMY      FAMILY HISTORY: Family History  Problem Relation Age of Onset  . Cancer Mother     unknown type  . Lung cancer Father   . Colon cancer Father     ADVANCED DIRECTIVES (Y/N):  N  HEALTH MAINTENANCE: Social History  Substance Use Topics  . Smoking status: Former Smoker    Years: 30.00    Types: Cigarettes    Quit date: 08/12/1981  . Smokeless tobacco: Never Used  . Alcohol use No     Colonoscopy:  PAP:  Bone density:  Lipid panel:  No Known Allergies  Current Outpatient Prescriptions  Medication Sig Dispense Refill  . aspirin EC 81 MG tablet Take 1 tablet by mouth 1 day or 1 dose.    . Cyanocobalamin (RA VITAMIN B-12 TR) 1000 MCG TBCR Take 1 tablet by mouth 1 day or 1 dose.    . diphenhydrAMINE (BENADRYL) 25 mg capsule Take 25 mg by mouth every 6 (six) hours as needed.    . fentaNYL (DURAGESIC - DOSED MCG/HR) 25 MCG/HR patch Place 1 patch (25 mcg total) onto the skin every 3 (three) days. 5 patch 0  . fexofenadine (ALLEGRA) 180 MG tablet Take 1 tablet by mouth 1 day or 1 dose.    Marland Kitchen Fluticasone-Salmeterol (ADVAIR DISKUS) 250-50 MCG/DOSE AEPB Inhale into the lungs.    . Multiple Vitamin (  MULTIVITAMIN) capsule Take 1 capsule by mouth daily.    . ondansetron (ZOFRAN) 8 MG tablet Take 1 tablet (8 mg total) by mouth 2 (two) times daily as needed for refractory nausea / vomiting. 30 tablet 1  . Oxycodone HCl 10 MG TABS Take 1 tablet (10 mg total) by mouth every 4 (four) hours as needed. 60 tablet 0  . Probiotic Product (PROBIOTIC DAILY) CAPS Take 1 capsule by mouth daily.    . prochlorperazine (COMPAZINE) 10 MG tablet Take 1 tablet (10 mg total) by mouth every 6 (six) hours as needed (Nausea or vomiting).  30 tablet 1  . oxyCODONE-acetaminophen (PERCOCET/ROXICET) 5-325 MG tablet Take 1 tablet by mouth every 4 (four) hours as needed for severe pain. (Patient not taking: Reported on 10/09/2016) 30 tablet 0   No current facility-administered medications for this visit.    Facility-Administered Medications Ordered in Other Visits  Medication Dose Route Frequency Provider Last Rate Last Dose  . 0.9 %  sodium chloride infusion  250 mL Intravenous PRN Teodoro Spray, MD      . 0.9 %  sodium chloride infusion  250 mL Intravenous PRN Teodoro Spray, MD      . 0.9% sodium chloride infusion  1 mL/kg/hr Intravenous Continuous Teodoro Spray, MD      . 0.9% sodium chloride infusion  1 mL/kg/hr Intravenous Continuous Teodoro Spray, MD      . sodium chloride flush (NS) 0.9 % injection 3 mL  3 mL Intravenous Q12H Teodoro Spray, MD      . sodium chloride flush (NS) 0.9 % injection 3 mL  3 mL Intravenous PRN Teodoro Spray, MD      . sodium chloride flush (NS) 0.9 % injection 3 mL  3 mL Intravenous Q12H Teodoro Spray, MD      . sodium chloride flush (NS) 0.9 % injection 3 mL  3 mL Intravenous PRN Teodoro Spray, MD        OBJECTIVE: Vitals:   10/09/16 0950  BP: 136/84  Pulse: (!) 108  Resp: 18  Temp: 98.6 F (37 C)     Body mass index is 23.76 kg/m.    ECOG FS:0 - Asymptomatic  General: Well-developed, well-nourished, no acute distress. Eyes: Pink conjunctiva, anicteric sclera. Lungs: Clear to auscultation bilaterally. Heart: Regular rate and rhythm. No rubs, murmurs, or gallops. Abdomen: Soft, nontender, nondistended. No organomegaly noted, normoactive bowel sounds. Musculoskeletal: No edema, cyanosis, or clubbing. Neuro: Alert, answering all questions appropriately. Cranial nerves grossly intact. Skin: No rashes or petechiae noted. Psych: Normal affect.   LAB RESULTS:  Lab Results  Component Value Date   NA 135 10/09/2016   K 4.6 10/09/2016   CL 101 10/09/2016   CO2 28 10/09/2016    GLUCOSE 120 (H) 10/09/2016   BUN 33 (H) 10/09/2016   CREATININE 1.33 (H) 10/09/2016   CALCIUM 9.4 10/09/2016   PROT 7.5 10/09/2016   ALBUMIN 3.7 10/09/2016   AST 41 10/09/2016   ALT 22 10/09/2016   ALKPHOS 50 10/09/2016   BILITOT 0.4 10/09/2016   GFRNONAA 47 (L) 10/09/2016   GFRAA 55 (L) 10/09/2016    Lab Results  Component Value Date   WBC 12.9 (H) 10/09/2016   NEUTROABS 9.5 (H) 10/09/2016   HGB 11.8 (L) 10/09/2016   HCT 34.2 (L) 10/09/2016   MCV 98.5 10/09/2016   PLT 329 10/09/2016     STUDIES: Mr Jeri Cos AT Contrast  Result Date: 10/07/2016 CLINICAL DATA:  New diagnosis lung cancer. Staging. Left upper lobe mass. EXAM: MRI HEAD WITHOUT AND WITH CONTRAST TECHNIQUE: Multiplanar, multiecho pulse sequences of the brain and surrounding structures were obtained without and with intravenous contrast. CONTRAST:  33m MULTIHANCE GADOBENATE DIMEGLUMINE 529 MG/ML IV SOLN COMPARISON:  08/16/2016 FINDINGS: Brain: No sign of acute infarction. There is a 2 mm metastasis within the right cerebellum axial image 16. There is a centrally necrotic metastasis within the left posterior frontal lobe measuring 13 mm in diameter, image 35. There is mild surrounding vasogenic edema around that lesion. No other enhancing lesions are seen. There are mild chronic small-vessel ischemic changes affecting the hemispheric white matter. No large vessel territory infarction. No hemorrhage. No hydrocephalus or extra-axial collection. No pituitary mass. Vascular: Major vessels at the base of the brain show flow. Skull and upper cervical spine: Negative Sinuses/Orbits: Clear/normal Other: None IMPRESSION: 13 mm centrally necrotic metastasis in the left posterior frontal lobe image 35 with surrounding edema. No mass effect or shift. 2 mm metastasis in the right cerebellum image 16 without edema. Electronically Signed   By: MNelson ChimesM.D.   On: 10/07/2016 15:14   Nm Pet Image Initial (pi) Skull Base To Thigh  Result  Date: 09/11/2016 CLINICAL DATA:  Initial treatment strategy for left lung mass. EXAM: NUCLEAR MEDICINE PET SKULL BASE TO THIGH TECHNIQUE: 11.13 MCi F-18 FDG was injected intravenously. Full-ring PET imaging was performed from the skull base to thigh after the radiotracer. CT data was obtained and used for attenuation correction and anatomic localization. FASTING BLOOD GLUCOSE:  Value: 78 mg/dl COMPARISON:  Chest CT 11/03/2015 hand 08/26/2016 FINDINGS: NECK No hypermetabolic lymph nodes in the neck. CHEST 3 cm partially cavitary left upper lobe lung mass is hypermetabolic with SUV max of 143.3 There is also extensive hypermetabolic left-sided mediastinal and hilar lymphadenopathy with SUV max of 16.85. Subcarinal adenopathy is also hypermetabolic with SUV max of 129.5Extensive metastatic pleural disease with numerous pleural nodules all which are hypermetabolic with SUV max of 118.8 Some of these lesions also appear to be invading the intercostal muscles of the left chest wall. Left-sided pleural effusion is likely malignant. There is also extensive underlying lung disease Several pulmonary nodules in the left upper lobe are also hypermetabolic. The largest nodule measures 10 mm on image number 90 and has an SUV max of 6.95. ABDOMEN/PELVIS 12 mm left-sided retroperitoneal lymph node on image number [id number] hypermetabolic with SUV max of 160.6 No findings for hepatic or adrenal gland metastasis. There is a hypermetabolic lesion in the pancreatic body with an SUV max of 5.4. It is difficult to see this lesion on the CT scan. This is most likely a metastatic focus given the negative MRI abdomen from 02/09/2016. Hypermetabolic focus noted just below the left kidney measures 11 mm. SUV max is 6.8. SKELETON Multiple metastatic bone lesions far identified in the spine and pelvis. No spinal canal compromise is demonstrated. IMPRESSION: 1. 3 cm partially cavitary left upper lobe lung mass is hypermetabolic and consistent with  neoplasm. There is also extensive hypermetabolic mediastinal and left hilar adenopathy and extensive pleural disease with numerous hypermetabolic pleural nodules and a malignant pleural effusion. 2. Several small left upper lobe pulmonary nodules are hypermetabolic. 3. Suspect metastatic lesion in the body of the pancreas and also abdominal implants. 4. Osseous metastatic disease. Electronically Signed   By: PMarijo SanesM.D.   On: 09/11/2016 16:08   Ct Biopsy  Result Date: 09/19/2016 CLINICAL DATA:  Cavitary left  upper lobe lung mass with associated extensive mediastinal and hilar lymphadenopathy, pleural metastases, bone metastases and retroperitoneal soft tissue mass. After review of potential areas for tissue biopsy, decision has been made to sample and inferior left lateral pleural mass for tissue diagnosis. EXAM: CT GUIDED CORE BIOPSY OF LEFT PLEURAL MASS COMPARISON:  PET scan on 09/11/2016 ANESTHESIA/SEDATION: 1.0 mg IV Versed; 50 mcg IV Fentanyl Total Moderate Sedation Time:  15 minutes. The patient's level of consciousness and physiologic status were continuously monitored during the procedure by Radiology nursing. PROCEDURE: The procedure risks, benefits, and alternatives were explained to the patient. Questions regarding the procedure were encouraged and answered. The patient understands and consents to the procedure. A time-out was performed prior to initiating the procedure. The inferior left chest wall was prepped with chlorhexidine in a sterile fashion, and a sterile drape was applied covering the operative field. A sterile gown and sterile gloves were used for the procedure. Local anesthesia was provided with 1% Lidocaine. Initial CT was performed in a supine position. Under CT guidance, a 17 gauge trocar needle was advanced to the level of a lateral inferior pleural mass. Three separate 18 gauge core biopsy samples were then obtained through the 17 gauge needle. Material was submitted in  formalin. Post biopsy CT images were performed after outer needle removal. COMPLICATIONS: None FINDINGS: Inferior and lateral pleural mass measuring 1.8 x 3.5 cm within the very inferior aspect of the left pleural space was chosen for sampling and demonstrates hypermetabolic activity by recent PET scan. Solid tissue was obtained.  The were no complications. IMPRESSION: CT-guided core biopsy performed of inferior left lateral pleural mass. Electronically Signed   By: Aletta Edouard M.D.   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  PLAN:    1. 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 Biopsy confirmed stage IV disease with bony metastasis. MRI the brain also reviewed independently revealing metastatic disease. After lengthy discussion with the patient, he wishes to pursue palliative chemotherapy with carboplatinum and etoposide. Given his advanced age, will dose reduce etoposide.  Patient will receive carboplatinum and etoposide on day 1 and then etoposide only on days 2 and 3. He will also receive Onpro Neulasta support on day 3 of each cycle. He will also receive Zometa approximately every 4 weeks for his bony metastasis.  Patient has declined port placement at this time, but will reconsider for cycle 2 if he tolerates the treatment well and wishes to continue. He has expressed interest in continued quality of life over quantity of life.  Return to clinic in 1 and 2 days for etoposide only, in 1 week for laboratory work and evaluation, and in 3 weeks for consideration of cycle 2.  2. Hypertension: Patient's blood pressure is mildly elevated today, monitor. 3. Pain: Patient was instructed to increase his fentanyl patch to 50 g in can take 1-2 tabs of oxycodone as needed. 4. Brain metastasis: Patient was given a prescription with radiation oncology. 5.  Shortness of breath: Patient had documented O2 sats of 89%, therefore home oxygen has been ordered.  Approximately 30 minutes was spent in discussion of which greater than 50% was consultation.  Patient expressed understanding and was in agreement with this plan. He also understands that He can call clinic at any time with any questions, concerns, or complaints.   Cancer  Staging Small cell lung cancer, left ([place]) Staging form: Lung, AJCC 7th Edition - Clinical stage from 09/25/2016: Stage IV (T2, N2, M1b) - Signed by Lloyd Huger, MD on 09/25/2016   Lloyd Huger, MD   10/09/2016 9:56 AM

## 2016-10-09 ENCOUNTER — Telehealth: Payer: Self-pay | Admitting: *Deleted

## 2016-10-09 ENCOUNTER — Inpatient Hospital Stay (HOSPITAL_BASED_OUTPATIENT_CLINIC_OR_DEPARTMENT_OTHER): Payer: Medicare HMO | Admitting: Oncology

## 2016-10-09 ENCOUNTER — Inpatient Hospital Stay: Payer: Medicare HMO

## 2016-10-09 VITALS — BP 136/84 | HR 108 | Temp 98.6°F | Resp 18 | Wt 175.2 lb

## 2016-10-09 DIAGNOSIS — I10 Essential (primary) hypertension: Secondary | ICD-10-CM | POA: Diagnosis not present

## 2016-10-09 DIAGNOSIS — C7951 Secondary malignant neoplasm of bone: Secondary | ICD-10-CM | POA: Diagnosis not present

## 2016-10-09 DIAGNOSIS — Z79899 Other long term (current) drug therapy: Secondary | ICD-10-CM

## 2016-10-09 DIAGNOSIS — C3492 Malignant neoplasm of unspecified part of left bronchus or lung: Secondary | ICD-10-CM

## 2016-10-09 DIAGNOSIS — C3412 Malignant neoplasm of upper lobe, left bronchus or lung: Secondary | ICD-10-CM | POA: Diagnosis not present

## 2016-10-09 DIAGNOSIS — Z5111 Encounter for antineoplastic chemotherapy: Secondary | ICD-10-CM | POA: Diagnosis not present

## 2016-10-09 DIAGNOSIS — C7931 Secondary malignant neoplasm of brain: Secondary | ICD-10-CM

## 2016-10-09 DIAGNOSIS — R0602 Shortness of breath: Secondary | ICD-10-CM

## 2016-10-09 LAB — COMPREHENSIVE METABOLIC PANEL
ALK PHOS: 50 U/L (ref 38–126)
ALT: 22 U/L (ref 17–63)
ANION GAP: 6 (ref 5–15)
AST: 41 U/L (ref 15–41)
Albumin: 3.7 g/dL (ref 3.5–5.0)
BILIRUBIN TOTAL: 0.4 mg/dL (ref 0.3–1.2)
BUN: 33 mg/dL — ABNORMAL HIGH (ref 6–20)
CALCIUM: 9.4 mg/dL (ref 8.9–10.3)
CO2: 28 mmol/L (ref 22–32)
Chloride: 101 mmol/L (ref 101–111)
Creatinine, Ser: 1.33 mg/dL — ABNORMAL HIGH (ref 0.61–1.24)
GFR, EST AFRICAN AMERICAN: 55 mL/min — AB (ref >60)
GFR, EST NON AFRICAN AMERICAN: 47 mL/min — AB (ref >60)
Glucose, Bld: 120 mg/dL — ABNORMAL HIGH (ref 65–99)
POTASSIUM: 4.6 mmol/L (ref 3.5–5.1)
Sodium: 135 mmol/L (ref 135–145)
TOTAL PROTEIN: 7.5 g/dL (ref 6.5–8.1)

## 2016-10-09 LAB — CBC WITH DIFFERENTIAL/PLATELET
BASOS PCT: 1 %
Basophils Absolute: 0.1 10*3/uL (ref 0–0.1)
Eosinophils Absolute: 0.3 10*3/uL (ref 0–0.7)
Eosinophils Relative: 2 %
HEMATOCRIT: 34.2 % — AB (ref 40.0–52.0)
HEMOGLOBIN: 11.8 g/dL — AB (ref 13.0–18.0)
LYMPHS ABS: 1.8 10*3/uL (ref 1.0–3.6)
Lymphocytes Relative: 14 %
MCH: 34.1 pg — AB (ref 26.0–34.0)
MCHC: 34.6 g/dL (ref 32.0–36.0)
MCV: 98.5 fL (ref 80.0–100.0)
MONO ABS: 1.2 10*3/uL — AB (ref 0.2–1.0)
MONOS PCT: 10 %
NEUTROS ABS: 9.5 10*3/uL — AB (ref 1.4–6.5)
NEUTROS PCT: 73 %
Platelets: 329 10*3/uL (ref 150–440)
RBC: 3.48 MIL/uL — ABNORMAL LOW (ref 4.40–5.90)
RDW: 12.8 % (ref 11.5–14.5)
WBC: 12.9 10*3/uL — ABNORMAL HIGH (ref 3.8–10.6)

## 2016-10-09 MED ORDER — DEXAMETHASONE SODIUM PHOSPHATE 100 MG/10ML IJ SOLN: 10.0000 mg | Freq: Once | INTRAMUSCULAR | Status: DC

## 2016-10-09 MED ORDER — PALONOSETRON HCL INJECTION 0.25 MG/5ML
0.2500 mg | Freq: Once | INTRAVENOUS | Status: AC
  Administered 2016-10-09: 0.25 mg via INTRAVENOUS
  Filled 2016-10-09: qty 5

## 2016-10-09 MED ORDER — DEXAMETHASONE SODIUM PHOSPHATE 10 MG/ML IJ SOLN
10.0000 mg | Freq: Once | INTRAMUSCULAR | Status: AC
  Administered 2016-10-09: 10 mg via INTRAVENOUS
  Filled 2016-10-09: qty 1

## 2016-10-09 MED ORDER — SODIUM CHLORIDE 0.9 % IV SOLN
Freq: Once | INTRAVENOUS | Status: AC
Start: 2016-10-09 — End: 2016-10-09
  Administered 2016-10-09: 11:00:00 via INTRAVENOUS
  Filled 2016-10-09: qty 1000

## 2016-10-09 MED ORDER — SODIUM CHLORIDE 0.9 % IV SOLN
80.0000 mg/m2 | Freq: Once | INTRAVENOUS | Status: AC
  Administered 2016-10-09: 160 mg via INTRAVENOUS
  Filled 2016-10-09: qty 8

## 2016-10-09 MED ORDER — CARBOPLATIN CHEMO INJECTION 450 MG/45ML
359.0000 mg | Freq: Once | INTRAVENOUS | Status: AC
  Administered 2016-10-09: 360 mg via INTRAVENOUS
  Filled 2016-10-09: qty 36

## 2016-10-09 MED ORDER — MEGESTROL ACETATE 40 MG PO TABS: 40.0000 mg | ORAL_TABLET | Freq: Every day | ORAL | 1 refills | Status: DC

## 2016-10-09 MED ORDER — ZOLEDRONIC ACID 4 MG/5ML IV CONC
3.3000 mg | Freq: Once | INTRAVENOUS | Status: AC
  Administered 2016-10-09: 3.3 mg via INTRAVENOUS
  Filled 2016-10-09: qty 4.13

## 2016-10-09 NOTE — Progress Notes (Signed)
Pt requires cont O2 at 2L via [place]. Pulse ox readings are:   Room air at rest - 89% Room air with exertion - 85% 2L at rest - 95% 2L with exertion - 93%

## 2016-10-09 NOTE — Telephone Encounter (Signed)
Spoke with [employer]. All additional info has been faxed to [place].

## 2016-10-09 NOTE — Progress Notes (Signed)
Patient is here today for follow up, he mentions he has pain across his back, he has been having some SOB. He has had shingles twice in his life (he wanted to mention it) he also suffers from Chronic hives.    Patient was having Sob last night he was gasping for air, pulse ox today was between 89-91  Does not have home oxygen

## 2016-10-09 NOTE — Telephone Encounter (Signed)
Asking for  A return call, needs more information than received

## 2016-10-10 ENCOUNTER — Inpatient Hospital Stay: Payer: Medicare HMO

## 2016-10-10 ENCOUNTER — Ambulatory Visit
Admission: RE | Admit: 2016-10-10 | Discharge: 2016-10-10 | Disposition: A | Discharge status: Home / Self Care | Payer: Medicare HMO | Source: Ambulatory Visit | Attending: Radiation Oncology | Admitting: Radiation Oncology

## 2016-10-10 ENCOUNTER — Encounter: Payer: Self-pay | Admitting: Radiation Oncology

## 2016-10-10 VITALS — BP 144/82 | HR 96 | Temp 96.8°F | Resp 18

## 2016-10-10 VITALS — BP 155/81 | HR 111 | Temp 97.9°F | Resp 18 | Wt 179.1 lb

## 2016-10-10 DIAGNOSIS — C7931 Secondary malignant neoplasm of brain: Secondary | ICD-10-CM

## 2016-10-10 DIAGNOSIS — Z5111 Encounter for antineoplastic chemotherapy: Secondary | ICD-10-CM | POA: Diagnosis not present

## 2016-10-10 DIAGNOSIS — C3492 Malignant neoplasm of unspecified part of left bronchus or lung: Secondary | ICD-10-CM

## 2016-10-10 DIAGNOSIS — Z510 Encounter for antineoplastic radiation therapy: Secondary | ICD-10-CM | POA: Diagnosis not present

## 2016-10-10 MED ORDER — SODIUM CHLORIDE 0.9 % IV SOLN
Freq: Once | INTRAVENOUS | Status: AC
  Administered 2016-10-10: 14:00:00 via INTRAVENOUS
  Filled 2016-10-10: qty 1000

## 2016-10-10 MED ORDER — SODIUM CHLORIDE 0.9 % IV SOLN
80.0000 mg/m2 | Freq: Once | INTRAVENOUS | Status: AC
  Administered 2016-10-10: 160 mg via INTRAVENOUS
  Filled 2016-10-10: qty 8

## 2016-10-10 MED ORDER — DEXAMETHASONE SODIUM PHOSPHATE 10 MG/ML IJ SOLN
10.0000 mg | Freq: Once | INTRAMUSCULAR | Status: AC
  Administered 2016-10-10: 10 mg via INTRAVENOUS
  Filled 2016-10-10: qty 1

## 2016-10-10 MED ORDER — SODIUM CHLORIDE 0.9 % IV SOLN: 10.0000 mg | Freq: Once | INTRAVENOUS | Status: DC

## 2016-10-10 NOTE — Progress Notes (Signed)
Radiation Oncology Follow up Note  Name: Noah Coleman   Date:   10/10/2016 MRN:  [id number] DOB: 10-23-1929    This 83 y.o. male presents to the clinic today for evaluation of brain metastasis secondary to small cell lung cancer stage IV.  REFERRING PROVIDER: Rusty Aus, MD  HPI: Patient is an 83 year old male originally consult in back in early January when he presented with weakness and shortness of breath and was found to have a 5 cm left upper lobe mass with prominent mediastinal lymphadenopathy. Pathology was positive for small cell undifferentiated carcinoma.Marland Kitchen PET CT scan showed cavitary left upper lobe mass which is hypermetabolic with extensive hypermetabolic mediastinal and left hilar adenopathy. Also pleural disease also metastatic lesion in the body the pancreas and abdominal implants as well as bone metastasis. He also had an MRI scan of his brain showing 2 lesions left posterior frontal and right cerebellum consistent with brain metastasis. He's also been having some pain in his left hip which shows a hypermetabolic activity of osseous involvement on PET/CT scan. He is seen today for consideration of palliative treatment. He specifically denies headaches any change in neurologic status or any motor or sensory decline.  COMPLICATIONS OF TREATMENT: none  FOLLOW UP COMPLIANCE: keeps appointments   PHYSICAL EXAM:  BP (!) 155/81   Pulse (!) 111   Temp 97.9 F (36.6 C)   Resp 18   Wt 179 lb 2 oz (81.3 kg)   BMI 24.29 kg/m  Well-developed well-nourished patient in NAD. HEENT reveals PERLA, EOMI, discs not visualized.  Oral cavity is clear. No oral mucosal lesions are identified. Neck is clear without evidence of cervical or supraclavicular adenopathy. Lungs are clear to A&P. Cardiac examination is essentially unremarkable with regular rate and rhythm without murmur rub or thrill. Abdomen is benign with no organomegaly or masses noted. Motor sensory and DTR levels are equal and  symmetric in the upper and lower extremities. Cranial nerves II through XII are grossly intact. Proprioception is intact. No peripheral adenopathy or edema is identified. No motor or sensory levels are noted. Crude visual fields are within normal range.  RADIOLOGY RESULTS: PET CT scan and MRI scan of brain reviewed compatible above-stated findings  PLAN: At this time like to go ahead with whole brain radiation. Would plan on delivering 3000 cGy in 10 fractions. We'll also give him a single fraction of palliative radiation therapy to his left hip for his painful metastasis in that area. I personally scheduled and ordered CT simulation for early next week. Will start the patient on Decadron if he is not oriented and placed on that prior to initiating radiation. Risks and benefits of treatment including hair loss fatigue possible cognitive decline skin reaction alteration of blood counts all were discussed in detail.  I would like to take this opportunity to thank you for allowing me to participate in the care of your patient.Armstead Peaks., MD

## 2016-10-11 ENCOUNTER — Inpatient Hospital Stay: Payer: Medicare HMO

## 2016-10-11 DIAGNOSIS — Z5111 Encounter for antineoplastic chemotherapy: Secondary | ICD-10-CM | POA: Diagnosis not present

## 2016-10-11 DIAGNOSIS — C3492 Malignant neoplasm of unspecified part of left bronchus or lung: Secondary | ICD-10-CM

## 2016-10-11 MED ORDER — PEGFILGRASTIM 6 MG/0.6ML ~~LOC~~ PSKT
6.0000 mg | PREFILLED_SYRINGE | Freq: Once | SUBCUTANEOUS | Status: AC
  Administered 2016-10-11: 6 mg via SUBCUTANEOUS
  Filled 2016-10-11: qty 0.6

## 2016-10-11 MED ORDER — SODIUM CHLORIDE 0.9 % IV SOLN
Freq: Once | INTRAVENOUS | Status: AC
  Administered 2016-10-11: 14:00:00 via INTRAVENOUS
  Filled 2016-10-11: qty 1000

## 2016-10-11 MED ORDER — MORPHINE SULFATE 2 MG/ML IJ SOLN
2.0000 mg | Freq: Once | INTRAMUSCULAR | Status: AC
  Administered 2016-10-11: 2 mg via INTRAVENOUS
  Filled 2016-10-11: qty 1

## 2016-10-11 MED ORDER — DEXAMETHASONE SODIUM PHOSPHATE 10 MG/ML IJ SOLN
10.0000 mg | Freq: Once | INTRAMUSCULAR | Status: AC
  Administered 2016-10-11: 10 mg via INTRAVENOUS
  Filled 2016-10-11: qty 1

## 2016-10-11 MED ORDER — SODIUM CHLORIDE 0.9 % IV SOLN
80.0000 mg/m2 | Freq: Once | INTRAVENOUS | Status: AC
  Administered 2016-10-11: 160 mg via INTRAVENOUS
  Filled 2016-10-11: qty 8

## 2016-10-11 MED ORDER — SODIUM CHLORIDE 0.9 % IV SOLN: 10.0000 mg | Freq: Once | INTRAVENOUS | Status: DC

## 2016-10-14 ENCOUNTER — Encounter: Payer: Self-pay | Admitting: Emergency Medicine

## 2016-10-14 ENCOUNTER — Emergency Department
Admission: EM | Admit: 2016-10-14 | Discharge: 2016-10-14 | Disposition: A | Discharge status: Home / Self Care | Payer: Medicare HMO | Attending: Emergency Medicine | Admitting: Emergency Medicine

## 2016-10-14 ENCOUNTER — Emergency Department: Payer: Medicare HMO

## 2016-10-14 DIAGNOSIS — Z87891 Personal history of nicotine dependence: Secondary | ICD-10-CM | POA: Insufficient documentation

## 2016-10-14 DIAGNOSIS — Z7982 Long term (current) use of aspirin: Secondary | ICD-10-CM | POA: Diagnosis not present

## 2016-10-14 DIAGNOSIS — K5903 Drug induced constipation: Secondary | ICD-10-CM | POA: Diagnosis not present

## 2016-10-14 DIAGNOSIS — Z85118 Personal history of other malignant neoplasm of bronchus and lung: Secondary | ICD-10-CM | POA: Insufficient documentation

## 2016-10-14 DIAGNOSIS — K5900 Constipation, unspecified: Secondary | ICD-10-CM | POA: Diagnosis present

## 2016-10-14 HISTORY — DX: Malignant neoplasm of unspecified part of unspecified bronchus or lung: C34.90

## 2016-10-14 LAB — CBC WITH DIFFERENTIAL/PLATELET
Basophils Absolute: 0 10*3/uL (ref 0–0.1)
Basophils Relative: 0 %
EOS PCT: 1 %
Eosinophils Absolute: 0.2 10*3/uL (ref 0–0.7)
HEMATOCRIT: 29.8 % — AB (ref 40.0–52.0)
Hemoglobin: 10.4 g/dL — ABNORMAL LOW (ref 13.0–18.0)
LYMPHS ABS: 0.9 10*3/uL — AB (ref 1.0–3.6)
LYMPHS PCT: 4 %
MCH: 34.5 pg — AB (ref 26.0–34.0)
MCHC: 34.9 g/dL (ref 32.0–36.0)
MCV: 98.7 fL (ref 80.0–100.0)
Monocytes Absolute: 0.1 10*3/uL — ABNORMAL LOW (ref 0.2–1.0)
Monocytes Relative: 1 %
NEUTROS ABS: 25.8 10*3/uL — AB (ref 1.4–6.5)
Neutrophils Relative %: 94 %
PLATELETS: 238 10*3/uL (ref 150–440)
RBC: 3.02 MIL/uL — ABNORMAL LOW (ref 4.40–5.90)
RDW: 13.2 % (ref 11.5–14.5)
WBC: 27.1 10*3/uL — AB (ref 3.8–10.6)

## 2016-10-14 LAB — COMPREHENSIVE METABOLIC PANEL
ALK PHOS: 50 U/L (ref 38–126)
ALT: 37 U/L (ref 17–63)
AST: 63 U/L — ABNORMAL HIGH (ref 15–41)
Albumin: 3.3 g/dL — ABNORMAL LOW (ref 3.5–5.0)
Anion gap: 5 (ref 5–15)
BILIRUBIN TOTAL: 0.8 mg/dL (ref 0.3–1.2)
BUN: 22 mg/dL — ABNORMAL HIGH (ref 6–20)
CALCIUM: 7.3 mg/dL — AB (ref 8.9–10.3)
CO2: 27 mmol/L (ref 22–32)
CREATININE: 0.93 mg/dL (ref 0.61–1.24)
Chloride: 105 mmol/L (ref 101–111)
Glucose, Bld: 96 mg/dL (ref 65–99)
Potassium: 4.1 mmol/L (ref 3.5–5.1)
Sodium: 137 mmol/L (ref 135–145)
TOTAL PROTEIN: 6.3 g/dL — AB (ref 6.5–8.1)

## 2016-10-14 LAB — LIPASE, BLOOD: LIPASE: 17 U/L (ref 11–51)

## 2016-10-14 MED ORDER — MAGNESIUM CITRATE PO SOLN
1.0000 | Freq: Once | ORAL | Status: AC
  Administered 2016-10-14: 1 via ORAL
  Filled 2016-10-14: qty 296

## 2016-10-14 MED ORDER — DOCUSATE SODIUM 100 MG PO CAPS: 100.0000 mg | ORAL_CAPSULE | Freq: Two times a day (BID) | ORAL | 2 refills | Status: AC

## 2016-10-14 MED ORDER — MINERAL OIL RE ENEM
1.0000 | ENEMA | Freq: Once | RECTAL | Status: AC
  Administered 2016-10-14: 1 via RECTAL

## 2016-10-14 NOTE — ED Triage Notes (Signed)
Pt over from [facility] with c/o back pain and constipation x1 week. Pt denies any abdominal pain. Pt has lung cancer with mets to the bone, had 3 chemo tx last week.

## 2016-10-14 NOTE — ED Notes (Signed)
The pt did not pass any stool , only the mineral oil enema. EDP notified.

## 2016-10-14 NOTE — ED Triage Notes (Signed)
Low back pain, abdominal pain, no appetite and no BM x 1 week.

## 2016-10-14 NOTE — ED Provider Notes (Signed)
[facility] Emergency Department Provider Note   ____________________________________________    I have reviewed the triage vital signs and the nursing notes.   HISTORY  Chief Complaint Back Pain; Constipation; and Abdominal Pain     HPI Noah Coleman is a 83 y.o. male who presents with constipation. Patient reports he has not had a bowel movement in 6-7 days. Patient reports recent diagnosis of lung cancer and did receive palliative chemotherapy last week. He has been on OxyContin and fentanyl as well. Reports he has been using MiraLAX. Denies nausea or vomiting. Denies abdominal bloating. No significant history of constipation. No fevers or chills   Past Medical History:  Diagnosis Date  . Anemia   . BPH (benign prostatic hyperplasia)   . Chronic idiopathic urticaria   . Diverticulosis   . Dupuytren's contracture of right hand   . History of hiatal hernia   . Hyperlipemia   . Lung cancer ([place])   . Shingles     Patient Active Problem List   Diagnosis Date Noted  . Goals of care, counseling/discussion 09/27/2016  . Mass of upper lobe of left lung 09/01/2016  . 2-vessel coronary artery disease 08/19/2016  . Small cell lung cancer, left ([place]) 08/19/2016  . Chest pain on exertion 08/07/2016  . Chronic urticaria 05/30/2016  . Cyst of pancreas 05/30/2016  . B12 deficiency 02/05/2016  . Dupuytren's contracture of right hand 05/29/2015    Past Surgical History:  Procedure Laterality Date  . CARDIAC CATHETERIZATION Left 08/12/2016   Procedure: Left Heart Cath and Coronary Angiography;  Surgeon: Teodoro Spray, MD;  Location: [place] [facility];  Service: Cardiovascular;  Laterality: Left;  . COLONOSCOPY    . EUS N/A 02/22/2016   Procedure: UPPER ENDOSCOPIC ULTRASOUND (EUS) LINEAR;  Surgeon: Cora Daniels, MD;  Location: [facility] ENDOSCOPY;  Service: Endoscopy;  Laterality: N/A;  . TONSILLECTOMY      Prior to Admission medications     Medication Sig Start Date End Date Taking? Authorizing Provider  aspirin EC 81 MG tablet Take 1 tablet by mouth 1 day or 1 dose.    Historical Provider, MD  Cyanocobalamin (RA VITAMIN B-12 TR) 1000 MCG TBCR Take 1 tablet by mouth 1 day or 1 dose.    Historical Provider, MD  diphenhydrAMINE (BENADRYL) 25 mg capsule Take 25 mg by mouth every 6 (six) hours as needed.    Historical Provider, MD  docusate sodium (COLACE) 100 MG capsule Take 1 capsule (100 mg total) by mouth 2 (two) times daily. 10/14/16 10/14/17  Lavonia Drafts, MD  fentaNYL (DURAGESIC - DOSED MCG/HR) 25 MCG/HR patch Place 1 patch (25 mcg total) onto the skin every 3 (three) days. 10/07/16   Lloyd Huger, MD  fexofenadine (ALLEGRA) 180 MG tablet Take 1 tablet by mouth 1 day or 1 dose.    Historical Provider, MD  Fluticasone-Salmeterol (ADVAIR DISKUS) 250-50 MCG/DOSE AEPB Inhale into the lungs. 09/30/16 09/30/17  Historical Provider, MD  megestrol (MEGACE) 40 MG tablet Take 1 tablet (40 mg total) by mouth daily. 10/09/16   Lloyd Huger, MD  Multiple Vitamin (MULTIVITAMIN) capsule Take 1 capsule by mouth daily.    Historical Provider, MD  ondansetron (ZOFRAN) 8 MG tablet Take 1 tablet (8 mg total) by mouth 2 (two) times daily as needed for refractory nausea / vomiting. 09/27/16   Lloyd Huger, MD  Oxycodone HCl 10 MG TABS Take 1 tablet (10 mg total) by mouth every 4 (four)  hours as needed. 10/07/16   Lloyd Huger, MD  oxyCODONE-acetaminophen (PERCOCET/ROXICET) 5-325 MG tablet Take 1 tablet by mouth every 4 (four) hours as needed for severe pain. Patient not taking: Reported on 10/09/2016 10/04/16   Lequita Asal, MD  Probiotic Product (PROBIOTIC DAILY) CAPS Take 1 capsule by mouth daily.    Historical Provider, MD  prochlorperazine (COMPAZINE) 10 MG tablet Take 1 tablet (10 mg total) by mouth every 6 (six) hours as needed (Nausea or vomiting). 09/27/16   Lloyd Huger, MD     Allergies Patient has no known  allergies.  Family History  Problem Relation Age of Onset  . Cancer Mother     unknown type  . Lung cancer Father   . Colon cancer Father     Social History Social History  Substance Use Topics  . Smoking status: Former Smoker    Years: 30.00    Types: Cigarettes    Quit date: 08/12/1981  . Smokeless tobacco: Never Used  . Alcohol use No    Review of Systems  Constitutional: No fever/chills  Cardiovascular: Denies chest pain. Respiratory: Denies shortness of breath. Gastrointestinal: Bloating  Genitourinary: Negative for dysuria. Musculoskeletal: Chronic back pain primarily in the mornings Skin: Negative for rash. Neurological: Negative for weakness  10-point ROS otherwise negative.  ____________________________________________   PHYSICAL EXAM:  VITAL SIGNS: ED Triage Vitals  Enc Vitals Group     BP 10/14/16 0831 140/64     Pulse Rate 10/14/16 0831 92     Resp 10/14/16 0831 20     Temp 10/14/16 0831 98.5 F (36.9 C)     Temp Source 10/14/16 0831 Oral     SpO2 10/14/16 0831 97 %     Weight 10/14/16 0832 179 lb (81.2 kg)     Height 10/14/16 0832 6' (1.829 m)     Head Circumference --      Peak Flow --      Pain Score 10/14/16 0832 8     Pain Loc --      Pain Edu? --      Excl. in [place]? --     Constitutional: Alert and oriented. No acute distress. Pleasant and interactive Eyes: Conjunctivae are normal.   Nose: No congestion/rhinnorhea. Mouth/Throat: Mucous membranes are moist.    Cardiovascular: Normal rate, regular rhythm. Grossly normal heart sounds.  Good peripheral circulation. Respiratory: Normal respiratory effort.  No retractions. Lungs CTAB. Gastrointestinal: Soft and nontender. No distention.  No CVA tenderness.  Musculoskeletal:   Warm and well perfused Neurologic:  Normal speech and language. No gross focal neurologic deficits are appreciated.  Skin:  Skin is warm, dry and intact. No rash noted. Psychiatric: Mood and affect are normal.  Speech and behavior are normal.  ____________________________________________   LABS (all labs ordered are listed, but only abnormal results are displayed)  Labs Reviewed  CBC WITH DIFFERENTIAL/PLATELET - Abnormal; Notable for the following:       Result Value   WBC 27.1 (*)    RBC 3.02 (*)    Hemoglobin 10.4 (*)    HCT 29.8 (*)    MCH 34.5 (*)    Neutro Abs 25.8 (*)    Lymphs Abs 0.9 (*)    Monocytes Absolute 0.1 (*)    All other components within normal limits  COMPREHENSIVE METABOLIC PANEL - Abnormal; Notable for the following:    BUN 22 (*)    Calcium 7.3 (*)    Total Protein 6.3 (*)  Albumin 3.3 (*)    AST 63 (*)    All other components within normal limits  LIPASE, BLOOD   ____________________________________________  EKG  None ____________________________________________  RADIOLOGY  No impaction, stool throughout the colon ____________________________________________   PROCEDURES  Procedure(s) performed: No    Critical Care performed: No ____________________________________________   INITIAL IMPRESSION / ASSESSMENT AND PLAN / ED COURSE  Pertinent labs & imaging results that were available during my care of the patient were reviewed by me and considered in my medical decision making (see chart for details).  Patient presents with constipation. His exam is reassuring and he is well-appearing. Afebrile. Normal vitals. Lab is significant for elevated white blood cell count likely related to the last of that he received last week. We will treat with an enema and magnesium citrate. I will send the patient home on Colace twice a day in addition to his MiraLAX. Discussed with Dr. Grayland Ormond of oncology who agrees    ____________________________________________   FINAL CLINICAL IMPRESSION(S) / ED DIAGNOSES  Final diagnoses:  Drug-induced constipation      NEW MEDICATIONS STARTED DURING THIS VISIT:  Discharge Medication List as of 10/14/2016 12:33  PM    START taking these medications   Details  docusate sodium (COLACE) 100 MG capsule Take 1 capsule (100 mg total) by mouth 2 (two) times daily., Starting Mon 10/14/2016, Until Tue 10/14/2017, Print         Note:  This document was prepared using Dragon voice recognition software and may include unintentional dictation errors.    Lavonia Drafts, MD 10/14/16 [id number]

## 2016-10-14 NOTE — Progress Notes (Signed)
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  INTERVAL HISTORY: Patient returns to clinic today for further evaluation and to assess his toleration of cycle 1 of carboplatinum and etoposide. He was seen in the emergency room for increasing constipation, but this has resolved. Patient otherwise tolerated his treatment well without significant side effects. His pain is better controlled. He continues to have mild dyspnea on exertion, but no longer uses home oxygen. He has no neurologic complaints. He denies any recent fevers or illnesses. He has a good appetite and denies weight loss. He has no chest pain, hemoptysis, or cough. He has no nausea, vomiting, or diarrhea. He has no urinary complaints. Patient offers no further specific complaints today.  REVIEW OF SYSTEMS:   Review of Systems  Constitutional: Negative.  Negative for fever, malaise/fatigue and weight loss.  Respiratory: Positive for shortness of breath. Negative for cough and hemoptysis.   Cardiovascular: Negative.  Negative for chest pain and leg swelling.  Gastrointestinal: Positive for constipation. Negative for abdominal pain.  Genitourinary: Negative.   Musculoskeletal: Positive for back pain.  Neurological: Negative.  Negative for weakness.  Psychiatric/Behavioral: The patient is nervous/anxious.     As per HPI. Otherwise, a complete review of systems is negative.  PAST MEDICAL HISTORY: Past Medical History:  Diagnosis Date  . Anemia   . BPH (benign prostatic hyperplasia)   . Chronic idiopathic urticaria   . Diverticulosis   . Dupuytren's contracture of right hand   . History of hiatal hernia   . Hyperlipemia   . Lung cancer ([place])   . Shingles     PAST  SURGICAL HISTORY: Past Surgical History:  Procedure Laterality Date  . CARDIAC CATHETERIZATION Left 08/12/2016   Procedure: Left Heart Cath and Coronary Angiography;  Surgeon: Teodoro Spray, MD;  Location: [place] [facility];  Service: Cardiovascular;  Laterality: Left;  . COLONOSCOPY    . EUS N/A 02/22/2016   Procedure: UPPER ENDOSCOPIC ULTRASOUND (EUS) LINEAR;  Surgeon: Cora Daniels, MD;  Location: [facility] ENDOSCOPY;  Service: Endoscopy;  Laterality: N/A;  . TONSILLECTOMY      FAMILY HISTORY: Family History  Problem Relation Age of Onset  . Cancer Mother     unknown type  . Lung cancer Father   . Colon cancer Father     ADVANCED DIRECTIVES (Y/N):  N  HEALTH MAINTENANCE: Social History  Substance Use Topics  . Smoking status: Former Smoker    Years: 30.00    Types: Cigarettes    Quit date: 08/12/1981  . Smokeless tobacco: Never Used  . Alcohol use No     Colonoscopy:  PAP:  Bone density:  Lipid panel:  No Known Allergies  Current Outpatient Prescriptions  Medication Sig Dispense Refill  . aspirin EC 81 MG tablet Take 1 tablet by mouth 1 day or 1 dose.    . Cyanocobalamin (RA VITAMIN B-12 TR) 1000 MCG TBCR Take 1 tablet by mouth 1 day or 1 dose.    . diphenhydrAMINE (BENADRYL) 25 mg capsule Take 25 mg by mouth every 6 (six) hours as needed.    . docusate sodium (COLACE) 100 MG capsule Take 1 capsule (100 mg total) by mouth 2 (two) times daily. 60 capsule 2  . fentaNYL (DURAGESIC -  DOSED MCG/HR) 50 MCG/HR Place 1 patch (50 mcg total) onto the skin every 3 (three) days. 10 patch 0  . fexofenadine (ALLEGRA) 180 MG tablet Take 1 tablet by mouth 1 day or 1 dose.    Marland Kitchen Fluticasone-Salmeterol (ADVAIR DISKUS) 250-50 MCG/DOSE AEPB Inhale into the lungs.    . megestrol (MEGACE) 40 MG tablet Take 1 tablet (40 mg total) by mouth daily. 30 tablet 1  . Multiple Vitamin (MULTIVITAMIN) capsule Take 1 capsule by mouth daily.    . ondansetron (ZOFRAN) 8 MG tablet Take 1 tablet  (8 mg total) by mouth 2 (two) times daily as needed for refractory nausea / vomiting. 30 tablet 1  . Oxycodone HCl 10 MG TABS Take 1 tablet (10 mg total) by mouth every 4 (four) hours as needed. (Patient taking differently: Take 20 mg by mouth every 4 (four) hours as needed. ) 60 tablet 0  . Probiotic Product (PROBIOTIC DAILY) CAPS Take 1 capsule by mouth daily.    . prochlorperazine (COMPAZINE) 10 MG tablet Take 1 tablet (10 mg total) by mouth every 6 (six) hours as needed (Nausea or vomiting). 30 tablet 1  . oxyCODONE-acetaminophen (PERCOCET/ROXICET) 5-325 MG tablet Take 1 tablet by mouth every 4 (four) hours as needed for severe pain. (Patient not taking: Reported on 10/09/2016) 30 tablet 0   No current facility-administered medications for this visit.    Facility-Administered Medications Ordered in Other Visits  Medication Dose Route Frequency Provider Last Rate Last Dose  . 0.9 %  sodium chloride infusion  250 mL Intravenous PRN Teodoro Spray, MD      . 0.9 %  sodium chloride infusion  250 mL Intravenous PRN Teodoro Spray, MD      . 0.9% sodium chloride infusion  1 mL/kg/hr Intravenous Continuous Teodoro Spray, MD      . 0.9% sodium chloride infusion  1 mL/kg/hr Intravenous Continuous Teodoro Spray, MD      . sodium chloride flush (NS) 0.9 % injection 3 mL  3 mL Intravenous Q12H Teodoro Spray, MD      . sodium chloride flush (NS) 0.9 % injection 3 mL  3 mL Intravenous PRN Teodoro Spray, MD      . sodium chloride flush (NS) 0.9 % injection 3 mL  3 mL Intravenous Q12H Teodoro Spray, MD      . sodium chloride flush (NS) 0.9 % injection 3 mL  3 mL Intravenous PRN Teodoro Spray, MD        OBJECTIVE: Vitals:   10/16/16 1037  BP: (!) 147/83  Pulse: 79  Resp: 18  Temp: 99.1 F (37.3 C)     Body mass index is 23.65 kg/m.    ECOG FS:0 - Asymptomatic  General: Well-developed, well-nourished, no acute distress. Eyes: Pink conjunctiva, anicteric sclera. Lungs: Clear to auscultation  bilaterally. Heart: Regular rate and rhythm. No rubs, murmurs, or gallops. Abdomen: Soft, nontender, nondistended. No organomegaly noted, normoactive bowel sounds. Musculoskeletal: No edema, cyanosis, or clubbing. Neuro: Alert, answering all questions appropriately. Cranial nerves grossly intact. Skin: No rashes or petechiae noted. Psych: Normal affect.   LAB RESULTS:  Lab Results  Component Value Date   NA 137 10/16/2016   K 4.3 10/16/2016   CL 104 10/16/2016   CO2 28 10/16/2016   GLUCOSE 100 (H) 10/16/2016   BUN 19 10/16/2016   CREATININE 0.79 10/16/2016   CALCIUM 7.9 (L) 10/16/2016   PROT 6.6 10/16/2016   ALBUMIN 3.5 10/16/2016  AST 36 10/16/2016   ALT 29 10/16/2016   ALKPHOS 64 10/16/2016   BILITOT 0.7 10/16/2016   GFRNONAA >60 10/16/2016   GFRAA >60 10/16/2016    Lab Results  Component Value Date   WBC 9.9 10/16/2016   NEUTROABS 8.4 (H) 10/16/2016   HGB 11.0 (L) 10/16/2016   HCT 32.8 (L) 10/16/2016   MCV 100.6 (H) 10/16/2016   PLT 184 10/16/2016     STUDIES: Dg Abdomen 1 View  Result Date: 10/14/2016 CLINICAL DATA:  Low back and abdominal pain, no appetite and no bowel movement for 1 week, past history lung cancer, coronary artery disease, former smoker EXAM: ABDOMEN - 1 VIEW COMPARISON:  PET-CT 09/11/2016 FINDINGS: Diffuse osseous demineralization. Increased stool throughout colon with few scattered colonic diverticula noted. No bowel dilatation or bowel wall thickening. No evidence of obstruction. Scattered aortoiliac calcification. No urinary tract calcifications. IMPRESSION: Increased stool throughout colon with a few scattered colonic diverticula noted. Aortic atherosclerosis. Electronically Signed   By: Lavonia Dana M.D.   On: 10/14/2016 09:44   Mr Jeri Cos FB Contrast  Result Date: 10/07/2016 CLINICAL DATA:  New diagnosis lung cancer. Staging. Left upper lobe mass. EXAM: MRI HEAD WITHOUT AND WITH CONTRAST TECHNIQUE: Multiplanar, multiecho pulse sequences of  the brain and surrounding structures were obtained without and with intravenous contrast. CONTRAST:  51m MULTIHANCE GADOBENATE DIMEGLUMINE 529 MG/ML IV SOLN COMPARISON:  08/16/2016 FINDINGS: Brain: No sign of acute infarction. There is a 2 mm metastasis within the right cerebellum axial image 16. There is a centrally necrotic metastasis within the left posterior frontal lobe measuring 13 mm in diameter, image 35. There is mild surrounding vasogenic edema around that lesion. No other enhancing lesions are seen. There are mild chronic small-vessel ischemic changes affecting the hemispheric white matter. No large vessel territory infarction. No hemorrhage. No hydrocephalus or extra-axial collection. No pituitary mass. Vascular: Major vessels at the base of the brain show flow. Skull and upper cervical spine: Negative Sinuses/Orbits: Clear/normal Other: None IMPRESSION: 13 mm centrally necrotic metastasis in the left posterior frontal lobe image 35 with surrounding edema. No mass effect or shift. 2 mm metastasis in the right cerebellum image 16 without edema. Electronically Signed   By: MNelson ChimesM.D.   On: 10/07/2016 15:14   Ct Biopsy  Result Date: 09/19/2016 CLINICAL DATA:  Cavitary left upper lobe lung mass with associated extensive mediastinal and hilar lymphadenopathy, pleural metastases, bone metastases and retroperitoneal soft tissue mass. After review of potential areas for tissue biopsy, decision has been made to sample and inferior left lateral pleural mass for tissue diagnosis. EXAM: CT GUIDED CORE BIOPSY OF LEFT PLEURAL MASS COMPARISON:  PET scan on 09/11/2016 ANESTHESIA/SEDATION: 1.0 mg IV Versed; 50 mcg IV Fentanyl Total Moderate Sedation Time:  15 minutes. The patient's level of consciousness and physiologic status were continuously monitored during the procedure by Radiology nursing. PROCEDURE: The procedure risks, benefits, and alternatives were explained to the patient. Questions regarding the  procedure were encouraged and answered. The patient understands and consents to the procedure. A time-out was performed prior to initiating the procedure. The inferior left chest wall was prepped with chlorhexidine in a sterile fashion, and a sterile drape was applied covering the operative field. A sterile gown and sterile gloves were used for the procedure. Local anesthesia was provided with 1% Lidocaine. Initial CT was performed in a supine position. Under CT guidance, a 17 gauge trocar needle was advanced to the level of a lateral inferior pleural mass.  Three separate 18 gauge core biopsy samples were then obtained through the 17 gauge needle. Material was submitted in formalin. Post biopsy CT images were performed after outer needle removal. COMPLICATIONS: None FINDINGS: Inferior and lateral pleural mass measuring 1.8 x 3.5 cm within the very inferior aspect of the left pleural space was chosen for sampling and demonstrates hypermetabolic activity by recent PET scan. Solid tissue was obtained.  The were no complications. IMPRESSION: CT-guided core biopsy performed of inferior left lateral pleural mass. Electronically Signed   By: Aletta Edouard M.D.   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  PLAN:    1. 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 Biopsy confirmed stage IV disease with bony metastasis. MRI the brain also reviewed independently revealing metastatic disease. He tolerated cycle 1 of palliative chemotherapy with carboplatinum and etoposide last week without significant side effects. Given his advanced age, etoposide was dose reduced. Patient will receive carboplatinum and etoposide on day 1 and then etoposide only on days 2 and 3. He will also receive Onpro Neulasta support on day 3 of each cycle. He will also receive Zometa approximately every 4  weeks for his bony metastasis.  Patient has declined port placement at this time, but will reconsider in the future. He has expressed interest in continued quality of life over quantity of life.  Return to clinic in 2 weeks for consideration of cycle 2.  2. Hypertension: Patient's blood pressure is mildly elevated today, monitor. 3. Pain: Continue fentanyl patch to 50 g every 72 hours and 1-2 tabs of oxycodone as needed. 4. Brain metastasis: Treatment per radiation oncology. 5. Shortness of breath: Patient had documented O2 sats of 89%, therefore home oxygen has been ordered. 6. Constipation: Patient has been instructed to use stool softeners daily and magnesium citrate as needed.  Approximately 30 minutes was spent in discussion of which greater than 50% was consultation.  Patient expressed understanding and was in agreement with this plan. He also understands that He can call clinic at any time with any questions, concerns, or complaints.   Cancer Staging Small cell lung cancer, left [facility]) Staging form: Lung, AJCC 7th Edition - Clinical stage from 09/25/2016: Stage IV (T2, N2, M1b) - Signed by Lloyd Huger, MD on 09/25/2016   Lloyd Huger, MD   10/18/2016 5:34 PM

## 2016-10-14 NOTE — ED Notes (Signed)
Pt up to the commode attempting to have a BM.

## 2016-10-15 ENCOUNTER — Ambulatory Visit: Payer: Medicare HMO

## 2016-10-15 ENCOUNTER — Other Ambulatory Visit: Payer: Self-pay | Admitting: *Deleted

## 2016-10-16 ENCOUNTER — Inpatient Hospital Stay (HOSPITAL_BASED_OUTPATIENT_CLINIC_OR_DEPARTMENT_OTHER): Payer: Medicare HMO | Admitting: Oncology

## 2016-10-16 ENCOUNTER — Inpatient Hospital Stay: Payer: Medicare HMO

## 2016-10-16 VITALS — BP 147/83 | HR 79 | Temp 99.1°F | Resp 18 | Wt 174.4 lb

## 2016-10-16 DIAGNOSIS — R0602 Shortness of breath: Secondary | ICD-10-CM

## 2016-10-16 DIAGNOSIS — C7931 Secondary malignant neoplasm of brain: Secondary | ICD-10-CM

## 2016-10-16 DIAGNOSIS — C3412 Malignant neoplasm of upper lobe, left bronchus or lung: Secondary | ICD-10-CM | POA: Diagnosis not present

## 2016-10-16 DIAGNOSIS — I10 Essential (primary) hypertension: Secondary | ICD-10-CM

## 2016-10-16 DIAGNOSIS — Z7689 Persons encountering health services in other specified circumstances: Secondary | ICD-10-CM

## 2016-10-16 DIAGNOSIS — Z5111 Encounter for antineoplastic chemotherapy: Secondary | ICD-10-CM | POA: Diagnosis not present

## 2016-10-16 DIAGNOSIS — C7951 Secondary malignant neoplasm of bone: Secondary | ICD-10-CM

## 2016-10-16 DIAGNOSIS — C3492 Malignant neoplasm of unspecified part of left bronchus or lung: Secondary | ICD-10-CM

## 2016-10-16 DIAGNOSIS — Z79899 Other long term (current) drug therapy: Secondary | ICD-10-CM

## 2016-10-16 LAB — CBC WITH DIFFERENTIAL/PLATELET
BASOS ABS: 0 10*3/uL (ref 0–0.1)
Basophils Relative: 0 %
EOS ABS: 0.1 10*3/uL (ref 0–0.7)
EOS PCT: 1 %
HCT: 32.8 % — ABNORMAL LOW (ref 40.0–52.0)
Hemoglobin: 11 g/dL — ABNORMAL LOW (ref 13.0–18.0)
Lymphocytes Relative: 12 %
Lymphs Abs: 1.1 10*3/uL (ref 1.0–3.6)
MCH: 33.8 pg (ref 26.0–34.0)
MCHC: 33.6 g/dL (ref 32.0–36.0)
MCV: 100.6 fL — ABNORMAL HIGH (ref 80.0–100.0)
Monocytes Absolute: 0.2 10*3/uL (ref 0.2–1.0)
Monocytes Relative: 2 %
Neutro Abs: 8.4 10*3/uL — ABNORMAL HIGH (ref 1.4–6.5)
Neutrophils Relative %: 85 %
PLATELETS: 184 10*3/uL (ref 150–440)
RBC: 3.27 MIL/uL — ABNORMAL LOW (ref 4.40–5.90)
RDW: 13 % (ref 11.5–14.5)
WBC: 9.9 10*3/uL (ref 3.8–10.6)

## 2016-10-16 LAB — COMPREHENSIVE METABOLIC PANEL
ALT: 29 U/L (ref 17–63)
AST: 36 U/L (ref 15–41)
Albumin: 3.5 g/dL (ref 3.5–5.0)
Alkaline Phosphatase: 64 U/L (ref 38–126)
Anion gap: 5 (ref 5–15)
BUN: 19 mg/dL (ref 6–20)
CHLORIDE: 104 mmol/L (ref 101–111)
CO2: 28 mmol/L (ref 22–32)
CREATININE: 0.79 mg/dL (ref 0.61–1.24)
Calcium: 7.9 mg/dL — ABNORMAL LOW (ref 8.9–10.3)
GFR calc non Af Amer: >60 mL/min (ref >60)
Glucose, Bld: 100 mg/dL — ABNORMAL HIGH (ref 65–99)
Potassium: 4.3 mmol/L (ref 3.5–5.1)
SODIUM: 137 mmol/L (ref 135–145)
Total Bilirubin: 0.7 mg/dL (ref 0.3–1.2)
Total Protein: 6.6 g/dL (ref 6.5–8.1)

## 2016-10-16 MED ORDER — FENTANYL 50 MCG/HR TD PT72
50.0000 ug | MEDICATED_PATCH | TRANSDERMAL | 0 refills | Status: DC
Start: 2016-10-16 — End: 2016-11-20

## 2016-10-16 NOTE — Progress Notes (Signed)
Patient is here for follow up, he is doing well

## 2016-10-17 ENCOUNTER — Ambulatory Visit
Admission: RE | Admit: 2016-10-17 | Discharge: 2016-10-17 | Disposition: A | Discharge status: Home / Self Care | Payer: Medicare HMO | Source: Ambulatory Visit | Attending: Radiation Oncology | Admitting: Radiation Oncology

## 2016-10-17 DIAGNOSIS — Z510 Encounter for antineoplastic radiation therapy: Secondary | ICD-10-CM | POA: Diagnosis not present

## 2016-10-18 DIAGNOSIS — T402X5A Adverse effect of other opioids, initial encounter: Secondary | ICD-10-CM

## 2016-10-18 DIAGNOSIS — K5903 Drug induced constipation: Secondary | ICD-10-CM | POA: Insufficient documentation

## 2016-10-18 DIAGNOSIS — C7931 Secondary malignant neoplasm of brain: Secondary | ICD-10-CM | POA: Insufficient documentation

## 2016-10-21 ENCOUNTER — Telehealth: Payer: Self-pay | Admitting: *Deleted

## 2016-10-21 ENCOUNTER — Ambulatory Visit
Admission: RE | Admit: 2016-10-21 | Discharge: 2016-10-21 | Disposition: A | Discharge status: Home / Self Care | Payer: Medicare HMO | Source: Ambulatory Visit | Attending: Radiation Oncology | Admitting: Radiation Oncology

## 2016-10-21 ENCOUNTER — Other Ambulatory Visit: Payer: Self-pay | Admitting: *Deleted

## 2016-10-21 DIAGNOSIS — Z510 Encounter for antineoplastic radiation therapy: Secondary | ICD-10-CM | POA: Diagnosis not present

## 2016-10-21 MED ORDER — OXYCODONE HCL 10 MG PO TABS: 10.0000 mg | ORAL_TABLET | ORAL | 0 refills | Status: DC | PRN

## 2016-10-21 NOTE — Telephone Encounter (Signed)
Ok

## 2016-10-21 NOTE — Telephone Encounter (Signed)
Order will be faxed to [employer] to discontinue oxygen.

## 2016-10-21 NOTE — Telephone Encounter (Signed)
-----   Message from Daiva Huge, RN sent at 10/21/2016  2:06 PM EST ----- Regarding: Oxycodone Refill Lorae Roig,   Mr. Noah Coleman is going to need a refill on his oxycodone.  He said he has a couple days left.  If you'll just let me know you got this message.  Thanks,   [facility]

## 2016-10-21 NOTE — Telephone Encounter (Signed)
Requests refill of oxycodone. Ready for pick up. Pt will get on 2/6 when comes for radiation treatment.

## 2016-10-21 NOTE — Telephone Encounter (Signed)
Called to report that he is not using his oxygen and wants it removed. The O2 company will not remove it without a doctors order, please send an order to discontinue it.

## 2016-10-22 ENCOUNTER — Ambulatory Visit: Payer: Medicare HMO

## 2016-10-22 ENCOUNTER — Ambulatory Visit
Admission: RE | Admit: 2016-10-22 | Discharge: 2016-10-22 | Disposition: A | Discharge status: Home / Self Care | Payer: Medicare HMO | Source: Ambulatory Visit | Attending: Radiation Oncology | Admitting: Radiation Oncology

## 2016-10-22 DIAGNOSIS — Z510 Encounter for antineoplastic radiation therapy: Secondary | ICD-10-CM | POA: Diagnosis not present

## 2016-10-23 ENCOUNTER — Ambulatory Visit: Payer: Medicare HMO

## 2016-10-23 ENCOUNTER — Ambulatory Visit
Admission: RE | Admit: 2016-10-23 | Discharge: 2016-10-23 | Disposition: A | Discharge status: Home / Self Care | Payer: Medicare HMO | Source: Ambulatory Visit | Attending: Radiation Oncology | Admitting: Radiation Oncology

## 2016-10-23 DIAGNOSIS — Z510 Encounter for antineoplastic radiation therapy: Secondary | ICD-10-CM | POA: Diagnosis not present

## 2016-10-24 ENCOUNTER — Ambulatory Visit: Payer: Medicare HMO

## 2016-10-24 ENCOUNTER — Ambulatory Visit
Admission: RE | Admit: 2016-10-24 | Discharge: 2016-10-24 | Disposition: A | Discharge status: Home / Self Care | Payer: Medicare HMO | Source: Ambulatory Visit | Attending: Radiation Oncology | Admitting: Radiation Oncology

## 2016-10-24 DIAGNOSIS — Z510 Encounter for antineoplastic radiation therapy: Secondary | ICD-10-CM | POA: Diagnosis not present

## 2016-10-25 ENCOUNTER — Ambulatory Visit: Payer: Medicare HMO

## 2016-10-25 ENCOUNTER — Ambulatory Visit
Admission: RE | Admit: 2016-10-25 | Discharge: 2016-10-25 | Disposition: A | Discharge status: Home / Self Care | Payer: Medicare HMO | Source: Ambulatory Visit | Attending: Radiation Oncology | Admitting: Radiation Oncology

## 2016-10-25 DIAGNOSIS — Z510 Encounter for antineoplastic radiation therapy: Secondary | ICD-10-CM | POA: Diagnosis not present

## 2016-10-28 ENCOUNTER — Ambulatory Visit
Admission: RE | Admit: 2016-10-28 | Discharge: 2016-10-28 | Disposition: A | Discharge status: Home / Self Care | Payer: Medicare HMO | Source: Ambulatory Visit | Attending: Radiation Oncology | Admitting: Radiation Oncology

## 2016-10-28 ENCOUNTER — Ambulatory Visit: Payer: Medicare HMO

## 2016-10-29 ENCOUNTER — Ambulatory Visit: Payer: Medicare HMO

## 2016-10-29 ENCOUNTER — Other Ambulatory Visit: Payer: Self-pay | Admitting: Internal Medicine

## 2016-10-29 DIAGNOSIS — M546 Pain in thoracic spine: Secondary | ICD-10-CM

## 2016-10-29 DIAGNOSIS — S22000A Wedge compression fracture of unspecified thoracic vertebra, initial encounter for closed fracture: Secondary | ICD-10-CM

## 2016-10-29 NOTE — Progress Notes (Signed)
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  INTERVAL HISTORY: Patient returns to clinic today for further evaluation and consideration of cycle 2 of carboplatinum and etoposide. He has now initiated XRT to his brain. His pain is better controlled, although has worsening back pain after tripping down some steps recently. He does not complain of dyspnea exertion or shortness of breath. His constipation is better controlled as well. He has no neurologic complaints. He denies any recent fevers or illnesses. He has a good appetite and denies weight loss. He has no chest pain, hemoptysis, or cough. He has no nausea, vomiting, or diarrhea. He has no urinary complaints. Patient offers no further specific complaints today.  REVIEW OF SYSTEMS:   Review of Systems  Constitutional: Negative.  Negative for fever, malaise/fatigue and weight loss.  Respiratory: Negative for cough, hemoptysis and shortness of breath.   Cardiovascular: Negative.  Negative for chest pain and leg swelling.  Gastrointestinal: Negative for abdominal pain and constipation.  Genitourinary: Negative.   Musculoskeletal: Positive for back pain.  Neurological: Negative.  Negative for weakness.  Psychiatric/Behavioral: Negative.  The patient is not nervous/anxious.     As per HPI. Otherwise, a complete review of systems is negative.  PAST MEDICAL HISTORY: Past Medical History:  Diagnosis Date  . Anemia   . BPH (benign prostatic hyperplasia)   . Chronic idiopathic urticaria   . Diverticulosis   . Dupuytren's contracture of right hand   . History of hiatal hernia   . Hyperlipemia   . Lung cancer ([place])   . Shingles     PAST SURGICAL HISTORY: Past Surgical History:    Procedure Laterality Date  . CARDIAC CATHETERIZATION Left 08/12/2016   Procedure: Left Heart Cath and Coronary Angiography;  Surgeon: Teodoro Spray, MD;  Location: [place] [facility];  Service: Cardiovascular;  Laterality: Left;  . COLONOSCOPY    . EUS N/A 02/22/2016   Procedure: UPPER ENDOSCOPIC ULTRASOUND (EUS) LINEAR;  Surgeon: Cora Daniels, MD;  Location: [facility] ENDOSCOPY;  Service: Endoscopy;  Laterality: N/A;  . TONSILLECTOMY      FAMILY HISTORY: Family History  Problem Relation Age of Onset  . Cancer Mother     unknown type  . Lung cancer Father   . Colon cancer Father     ADVANCED DIRECTIVES (Y/N):  N  HEALTH MAINTENANCE: Social History  Substance Use Topics  . Smoking status: Former Smoker    Years: 30.00    Types: Cigarettes    Quit date: 08/12/1981  . Smokeless tobacco: Never Used  . Alcohol use No     Colonoscopy:  PAP:  Bone density:  Lipid panel:  No Known Allergies  Current Outpatient Prescriptions  Medication Sig Dispense Refill  . aspirin EC 81 MG tablet Take 1 tablet by mouth 1 day or 1 dose.    . Cyanocobalamin (RA VITAMIN B-12 TR) 1000 MCG TBCR Take 1 tablet by mouth 1 day or 1 dose.    . diphenhydrAMINE (BENADRYL) 25 mg capsule Take 25 mg by mouth every 6 (six) hours as needed.    . docusate sodium (COLACE) 100 MG capsule Take 1 capsule (100 mg total) by mouth 2 (two) times daily. 60 capsule 2  . fentaNYL (DURAGESIC - DOSED MCG/HR)  50 MCG/HR Place 1 patch (50 mcg total) onto the skin every 3 (three) days. 10 patch 0  . fexofenadine (ALLEGRA) 180 MG tablet Take 1 tablet by mouth 1 day or 1 dose.    Marland Kitchen Fluticasone-Salmeterol (ADVAIR DISKUS) 250-50 MCG/DOSE AEPB Inhale into the lungs.    Marland Kitchen linaclotide (LINZESS) 145 MCG CAPS capsule Take 145 mcg by mouth.    . megestrol (MEGACE) 40 MG tablet Take 1 tablet (40 mg total) by mouth daily. 30 tablet 1  . Multiple Vitamin (MULTIVITAMIN) capsule Take 1 capsule by mouth daily.    . ondansetron  (ZOFRAN) 8 MG tablet Take 1 tablet (8 mg total) by mouth 2 (two) times daily as needed for refractory nausea / vomiting. 30 tablet 1  . Oxycodone HCl 10 MG TABS Take 1-2 tablets (10-20 mg total) by mouth every 4 (four) hours as needed. 60 tablet 0  . Probiotic Product (PROBIOTIC DAILY) CAPS Take 1 capsule by mouth daily.    . prochlorperazine (COMPAZINE) 10 MG tablet Take 1 tablet (10 mg total) by mouth every 6 (six) hours as needed (Nausea or vomiting). 30 tablet 1  . ALPRAZolam (XANAX) 0.5 MG tablet Take 1 tablet (0.5 mg total) by mouth daily. Take 1 hour before radiation 10 tablet 0  . oxyCODONE-acetaminophen (PERCOCET/ROXICET) 5-325 MG tablet Take 1 tablet by mouth every 4 (four) hours as needed for severe pain. (Patient not taking: Reported on 10/30/2016) 30 tablet 0   No current facility-administered medications for this visit.    Facility-Administered Medications Ordered in Other Visits  Medication Dose Route Frequency Provider Last Rate Last Dose  . 0.9 %  sodium chloride infusion  250 mL Intravenous PRN Teodoro Spray, MD      . 0.9 %  sodium chloride infusion  250 mL Intravenous PRN Teodoro Spray, MD      . 0.9 %  sodium chloride infusion   Intravenous Once Lloyd Huger, MD      . 0.9% sodium chloride infusion  1 mL/kg/hr Intravenous Continuous Teodoro Spray, MD      . 0.9% sodium chloride infusion  1 mL/kg/hr Intravenous Continuous Teodoro Spray, MD      . dexamethasone (DECADRON) injection 10 mg  10 mg Intravenous Once Lloyd Huger, MD      . etoposide (VEPESID) 160 mg in sodium chloride 0.9 % 500 mL chemo infusion  80 mg/m2 (Treatment Plan Recorded) Intravenous Once Lloyd Huger, MD      . pegfilgrastim (NEULASTA ONPRO KIT) injection 6 mg  6 mg Subcutaneous Once Lloyd Huger, MD      . sodium chloride flush (NS) 0.9 % injection 3 mL  3 mL Intravenous Q12H Teodoro Spray, MD      . sodium chloride flush (NS) 0.9 % injection 3 mL  3 mL Intravenous PRN Teodoro Spray, MD      . sodium chloride flush (NS) 0.9 % injection 3 mL  3 mL Intravenous Q12H Teodoro Spray, MD      . sodium chloride flush (NS) 0.9 % injection 3 mL  3 mL Intravenous PRN Teodoro Spray, MD        OBJECTIVE: Vitals:   10/30/16 1022  BP: (!) 161/88  Pulse: 90  Temp: 98.5 F (36.9 C)     Body mass index is 22.9 kg/m.    ECOG FS:0 - Asymptomatic  General: Well-developed, well-nourished, no acute distress. Eyes: Pink conjunctiva, anicteric sclera. Lungs: Clear  to auscultation bilaterally. Heart: Regular rate and rhythm. No rubs, murmurs, or gallops. Abdomen: Soft, nontender, nondistended. No organomegaly noted, normoactive bowel sounds. Musculoskeletal: No edema, cyanosis, or clubbing. Neuro: Alert, answering all questions appropriately. Cranial nerves grossly intact. Skin: No rashes or petechiae noted. Psych: Normal affect.   LAB RESULTS:  Lab Results  Component Value Date   NA 138 10/30/2016   K 4.1 10/30/2016   CL 106 10/30/2016   CO2 27 10/30/2016   GLUCOSE 106 (H) 10/30/2016   BUN 14 10/30/2016   CREATININE 0.86 10/30/2016   CALCIUM 8.6 (L) 10/30/2016   PROT 7.0 10/30/2016   ALBUMIN 3.7 10/30/2016   AST 24 10/30/2016   ALT 14 (L) 10/30/2016   ALKPHOS 76 10/30/2016   BILITOT 0.5 10/30/2016   GFRNONAA >60 10/30/2016   GFRAA >60 10/30/2016    Lab Results  Component Value Date   WBC 12.2 (H) 10/30/2016   NEUTROABS 10.0 (H) 10/30/2016   HGB 9.8 (L) 10/30/2016   HCT 27.9 (L) 10/30/2016   MCV 99.4 10/30/2016   PLT 469 (H) 10/30/2016     STUDIES: Dg Abdomen 1 View  Result Date: 10/14/2016 CLINICAL DATA:  Low back and abdominal pain, no appetite and no bowel movement for 1 week, past history lung cancer, coronary artery disease, former smoker EXAM: ABDOMEN - 1 VIEW COMPARISON:  PET-CT 09/11/2016 FINDINGS: Diffuse osseous demineralization. Increased stool throughout colon with few scattered colonic diverticula noted. No bowel dilatation or bowel wall  thickening. No evidence of obstruction. Scattered aortoiliac calcification. No urinary tract calcifications. IMPRESSION: Increased stool throughout colon with a few scattered colonic diverticula noted. Aortic atherosclerosis. Electronically Signed   By: Lavonia Dana M.D.   On: 10/14/2016 09:44   Mr Jeri Cos BM Contrast  Result Date: 10/07/2016 CLINICAL DATA:  New diagnosis lung cancer. Staging. Left upper lobe mass. EXAM: MRI HEAD WITHOUT AND WITH CONTRAST TECHNIQUE: Multiplanar, multiecho pulse sequences of the brain and surrounding structures were obtained without and with intravenous contrast. CONTRAST:  7m MULTIHANCE GADOBENATE DIMEGLUMINE 529 MG/ML IV SOLN COMPARISON:  08/16/2016 FINDINGS: Brain: No sign of acute infarction. There is a 2 mm metastasis within the right cerebellum axial image 16. There is a centrally necrotic metastasis within the left posterior frontal lobe measuring 13 mm in diameter, image 35. There is mild surrounding vasogenic edema around that lesion. No other enhancing lesions are seen. There are mild chronic small-vessel ischemic changes affecting the hemispheric white matter. No large vessel territory infarction. No hemorrhage. No hydrocephalus or extra-axial collection. No pituitary mass. Vascular: Major vessels at the base of the brain show flow. Skull and upper cervical spine: Negative Sinuses/Orbits: Clear/normal Other: None IMPRESSION: 13 mm centrally necrotic metastasis in the left posterior frontal lobe image 35 with surrounding edema. No mass effect or shift. 2 mm metastasis in the right cerebellum image 16 without edema. Electronically Signed   By: MNelson ChimesM.D.   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  PLAN:    1. 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 Biopsy confirmed stage IV disease with bony metastasis. MRI  the brain also reviewed independently revealing metastatic disease. Proceed with cycle 2 of palliative chemotherapy with carboplatinum and etoposide today. Given his advanced age, etoposide was dose reduced. Patient will receive carboplatinum and etoposide on day 1 and then etoposide only on  days 2 and 3. He will also receive Onpro Neulasta support on day 3 of each cycle. He will also receive Zometa approximately every 4 weeks for his bony metastasis.  Patient has declined port placement at this time, but will reconsider in the future. He has expressed interest in continued quality of life over quantity of life.  Return to clinic in 3 weeks for consideration of cycle 3.  2. Hypertension: Patient's blood pressure is mildly elevated today, monitor. 3. Pain: Continue fentanyl patch to 50 g every 72 hours and 1-2 tabs of oxycodone as needed. 4. Brain metastasis: Treatment per radiation oncology. Patient will complete on November 06, 2016. 5. Shortness of breath: Improved. Patient no longer requires home oxygen. 6. Constipation: Patient has been instructed to use stool softeners daily and magnesium citrate as needed. 7. Back pain: Patient has an MRI of his back scheduled for this coming Saturday.   Patient expressed understanding and was in agreement with this plan. He also understands that He can call clinic at any time with any questions, concerns, or complaints.   Cancer Staging Small cell lung cancer, left [facility]) Staging form: Lung, AJCC 7th Edition - Clinical stage from 09/25/2016: Stage IV (T2, N2, M1b) - Signed by Lloyd Huger, MD on 09/25/2016   Lloyd Huger, MD   11/01/2016 1:43 PM

## 2016-10-30 ENCOUNTER — Ambulatory Visit
Admission: RE | Admit: 2016-10-30 | Discharge: 2016-10-30 | Disposition: A | Discharge status: Home / Self Care | Payer: Medicare HMO | Source: Ambulatory Visit | Attending: Radiation Oncology | Admitting: Radiation Oncology

## 2016-10-30 ENCOUNTER — Other Ambulatory Visit: Payer: Self-pay | Admitting: *Deleted

## 2016-10-30 ENCOUNTER — Inpatient Hospital Stay: Payer: Medicare HMO

## 2016-10-30 ENCOUNTER — Ambulatory Visit: Payer: Medicare HMO

## 2016-10-30 ENCOUNTER — Inpatient Hospital Stay: Payer: Medicare HMO | Attending: Oncology | Admitting: Oncology

## 2016-10-30 VITALS — BP 161/88 | HR 90 | Temp 98.5°F | Wt 168.9 lb

## 2016-10-30 DIAGNOSIS — Z87891 Personal history of nicotine dependence: Secondary | ICD-10-CM | POA: Insufficient documentation

## 2016-10-30 DIAGNOSIS — C7951 Secondary malignant neoplasm of bone: Secondary | ICD-10-CM | POA: Diagnosis not present

## 2016-10-30 DIAGNOSIS — C3412 Malignant neoplasm of upper lobe, left bronchus or lung: Secondary | ICD-10-CM | POA: Diagnosis not present

## 2016-10-30 DIAGNOSIS — Z79899 Other long term (current) drug therapy: Secondary | ICD-10-CM

## 2016-10-30 DIAGNOSIS — Z510 Encounter for antineoplastic radiation therapy: Secondary | ICD-10-CM | POA: Diagnosis not present

## 2016-10-30 DIAGNOSIS — E785 Hyperlipidemia, unspecified: Secondary | ICD-10-CM | POA: Insufficient documentation

## 2016-10-30 DIAGNOSIS — Z7982 Long term (current) use of aspirin: Secondary | ICD-10-CM | POA: Insufficient documentation

## 2016-10-30 DIAGNOSIS — C7931 Secondary malignant neoplasm of brain: Secondary | ICD-10-CM | POA: Diagnosis not present

## 2016-10-30 DIAGNOSIS — C3492 Malignant neoplasm of unspecified part of left bronchus or lung: Secondary | ICD-10-CM

## 2016-10-30 DIAGNOSIS — D630 Anemia in neoplastic disease: Secondary | ICD-10-CM

## 2016-10-30 DIAGNOSIS — N400 Enlarged prostate without lower urinary tract symptoms: Secondary | ICD-10-CM | POA: Insufficient documentation

## 2016-10-30 DIAGNOSIS — Z5111 Encounter for antineoplastic chemotherapy: Secondary | ICD-10-CM | POA: Insufficient documentation

## 2016-10-30 DIAGNOSIS — R0602 Shortness of breath: Secondary | ICD-10-CM | POA: Insufficient documentation

## 2016-10-30 DIAGNOSIS — I10 Essential (primary) hypertension: Secondary | ICD-10-CM | POA: Diagnosis not present

## 2016-10-30 DIAGNOSIS — K5900 Constipation, unspecified: Secondary | ICD-10-CM | POA: Insufficient documentation

## 2016-10-30 LAB — COMPREHENSIVE METABOLIC PANEL
ALBUMIN: 3.7 g/dL (ref 3.5–5.0)
ALT: 14 U/L — ABNORMAL LOW (ref 17–63)
ANION GAP: 5 (ref 5–15)
AST: 24 U/L (ref 15–41)
Alkaline Phosphatase: 76 U/L (ref 38–126)
BILIRUBIN TOTAL: 0.5 mg/dL (ref 0.3–1.2)
BUN: 14 mg/dL (ref 6–20)
CO2: 27 mmol/L (ref 22–32)
Calcium: 8.6 mg/dL — ABNORMAL LOW (ref 8.9–10.3)
Chloride: 106 mmol/L (ref 101–111)
Creatinine, Ser: 0.86 mg/dL (ref 0.61–1.24)
GFR calc Af Amer: >60 mL/min (ref >60)
GFR calc non Af Amer: >60 mL/min (ref >60)
GLUCOSE: 106 mg/dL — AB (ref 65–99)
POTASSIUM: 4.1 mmol/L (ref 3.5–5.1)
SODIUM: 138 mmol/L (ref 135–145)
Total Protein: 7 g/dL (ref 6.5–8.1)

## 2016-10-30 LAB — CBC WITH DIFFERENTIAL/PLATELET
BASOS PCT: 0 %
Basophils Absolute: 0 10*3/uL (ref 0–0.1)
EOS ABS: 0 10*3/uL (ref 0–0.7)
EOS PCT: 0 %
HEMATOCRIT: 27.9 % — AB (ref 40.0–52.0)
Hemoglobin: 9.8 g/dL — ABNORMAL LOW (ref 13.0–18.0)
Lymphocytes Relative: 11 %
Lymphs Abs: 1.3 10*3/uL (ref 1.0–3.6)
MCH: 34.8 pg — ABNORMAL HIGH (ref 26.0–34.0)
MCHC: 35 g/dL (ref 32.0–36.0)
MCV: 99.4 fL (ref 80.0–100.0)
MONO ABS: 0.8 10*3/uL (ref 0.2–1.0)
Monocytes Relative: 7 %
Neutro Abs: 10 10*3/uL — ABNORMAL HIGH (ref 1.4–6.5)
Neutrophils Relative %: 82 %
PLATELETS: 469 10*3/uL — AB (ref 150–440)
RBC: 2.81 MIL/uL — ABNORMAL LOW (ref 4.40–5.90)
RDW: 13 % (ref 11.5–14.5)
WBC: 12.2 10*3/uL — ABNORMAL HIGH (ref 3.8–10.6)

## 2016-10-30 MED ORDER — DEXAMETHASONE SODIUM PHOSPHATE 10 MG/ML IJ SOLN
10.0000 mg | Freq: Once | INTRAMUSCULAR | Status: AC
Start: 2016-10-30 — End: 2016-10-30
  Administered 2016-10-30: 10 mg via INTRAVENOUS
  Filled 2016-10-30: qty 1

## 2016-10-30 MED ORDER — ALPRAZOLAM 0.5 MG PO TABS: 0.5000 mg | ORAL_TABLET | Freq: Every day | ORAL | 0 refills | Status: DC

## 2016-10-30 MED ORDER — PALONOSETRON HCL INJECTION 0.25 MG/5ML
0.2500 mg | Freq: Once | INTRAVENOUS | Status: AC
  Administered 2016-10-30: 0.25 mg via INTRAVENOUS
  Filled 2016-10-30: qty 5

## 2016-10-30 MED ORDER — SODIUM CHLORIDE 0.9 % IV SOLN
Freq: Once | INTRAVENOUS | Status: AC
  Administered 2016-10-30: 11:00:00 via INTRAVENOUS
  Filled 2016-10-30: qty 1000

## 2016-10-30 MED ORDER — DEXAMETHASONE SODIUM PHOSPHATE 100 MG/10ML IJ SOLN: 10.0000 mg | Freq: Once | INTRAMUSCULAR | Status: DC

## 2016-10-30 MED ORDER — SODIUM CHLORIDE 0.9 % IV SOLN
440.0000 mg | Freq: Once | INTRAVENOUS | Status: AC
  Administered 2016-10-30: 440 mg via INTRAVENOUS
  Filled 2016-10-30: qty 44

## 2016-10-30 MED ORDER — SODIUM CHLORIDE 0.9 % IV SOLN
80.0000 mg/m2 | Freq: Once | INTRAVENOUS | Status: AC
  Administered 2016-10-30: 160 mg via INTRAVENOUS
  Filled 2016-10-30: qty 8

## 2016-10-30 NOTE — Progress Notes (Signed)
Patient denies pain or discomfort at this time. BP 161/88 and documented

## 2016-10-31 ENCOUNTER — Ambulatory Visit
Admission: RE | Admit: 2016-10-31 | Discharge: 2016-10-31 | Disposition: A | Discharge status: Home / Self Care | Payer: Medicare HMO | Source: Ambulatory Visit | Attending: Radiation Oncology | Admitting: Radiation Oncology

## 2016-10-31 ENCOUNTER — Inpatient Hospital Stay: Payer: Medicare HMO

## 2016-10-31 ENCOUNTER — Ambulatory Visit: Payer: Medicare HMO

## 2016-10-31 VITALS — BP 138/83 | HR 76 | Temp 98.2°F | Resp 18

## 2016-10-31 DIAGNOSIS — Z5111 Encounter for antineoplastic chemotherapy: Secondary | ICD-10-CM | POA: Diagnosis not present

## 2016-10-31 DIAGNOSIS — C3492 Malignant neoplasm of unspecified part of left bronchus or lung: Secondary | ICD-10-CM

## 2016-10-31 DIAGNOSIS — Z510 Encounter for antineoplastic radiation therapy: Secondary | ICD-10-CM | POA: Diagnosis not present

## 2016-10-31 MED ORDER — SODIUM CHLORIDE 0.9 % IV SOLN
80.0000 mg/m2 | Freq: Once | INTRAVENOUS | Status: AC
  Administered 2016-10-31: 160 mg via INTRAVENOUS
  Filled 2016-10-31: qty 8

## 2016-10-31 MED ORDER — SODIUM CHLORIDE 0.9 % IV SOLN
Freq: Once | INTRAVENOUS | Status: AC
  Administered 2016-10-31: 14:00:00 via INTRAVENOUS
  Filled 2016-10-31: qty 1000

## 2016-10-31 MED ORDER — DEXAMETHASONE SODIUM PHOSPHATE 10 MG/ML IJ SOLN
10.0000 mg | Freq: Once | INTRAMUSCULAR | Status: AC
  Administered 2016-10-31: 10 mg via INTRAVENOUS
  Filled 2016-10-31: qty 1

## 2016-11-01 ENCOUNTER — Ambulatory Visit
Admission: RE | Admit: 2016-11-01 | Discharge: 2016-11-01 | Disposition: A | Discharge status: Home / Self Care | Payer: Medicare HMO | Source: Ambulatory Visit | Attending: Radiation Oncology | Admitting: Radiation Oncology

## 2016-11-01 ENCOUNTER — Ambulatory Visit: Payer: Medicare HMO

## 2016-11-01 ENCOUNTER — Inpatient Hospital Stay: Payer: Medicare HMO

## 2016-11-01 VITALS — BP 148/81 | HR 81 | Temp 97.4°F | Resp 20

## 2016-11-01 DIAGNOSIS — Z5111 Encounter for antineoplastic chemotherapy: Secondary | ICD-10-CM | POA: Diagnosis not present

## 2016-11-01 DIAGNOSIS — Z510 Encounter for antineoplastic radiation therapy: Secondary | ICD-10-CM | POA: Diagnosis not present

## 2016-11-01 DIAGNOSIS — C3492 Malignant neoplasm of unspecified part of left bronchus or lung: Secondary | ICD-10-CM

## 2016-11-01 MED ORDER — ETOPOSIDE CHEMO INJECTION 1 GM/50ML
80.0000 mg/m2 | Freq: Once | INTRAVENOUS | Status: AC
  Administered 2016-11-01: 160 mg via INTRAVENOUS
  Filled 2016-11-01: qty 8

## 2016-11-01 MED ORDER — SODIUM CHLORIDE 0.9 % IV SOLN
Freq: Once | INTRAVENOUS | Status: AC
  Administered 2016-11-01: 14:00:00 via INTRAVENOUS
  Filled 2016-11-01: qty 1000

## 2016-11-01 MED ORDER — DEXAMETHASONE SODIUM PHOSPHATE 10 MG/ML IJ SOLN
10.0000 mg | Freq: Once | INTRAMUSCULAR | Status: AC
  Administered 2016-11-01: 10 mg via INTRAVENOUS
  Filled 2016-11-01: qty 1

## 2016-11-01 MED ORDER — PEGFILGRASTIM 6 MG/0.6ML ~~LOC~~ PSKT
6.0000 mg | PREFILLED_SYRINGE | Freq: Once | SUBCUTANEOUS | Status: AC
  Administered 2016-11-01: 6 mg via SUBCUTANEOUS
  Filled 2016-11-01: qty 0.6

## 2016-11-02 ENCOUNTER — Ambulatory Visit
Admission: RE | Admit: 2016-11-02 | Discharge: 2016-11-02 | Disposition: A | Discharge status: Home / Self Care | Payer: Medicare HMO | Source: Ambulatory Visit | Attending: Internal Medicine | Admitting: Internal Medicine

## 2016-11-02 DIAGNOSIS — J90 Pleural effusion, not elsewhere classified: Secondary | ICD-10-CM | POA: Diagnosis not present

## 2016-11-02 DIAGNOSIS — C7951 Secondary malignant neoplasm of bone: Secondary | ICD-10-CM | POA: Insufficient documentation

## 2016-11-02 DIAGNOSIS — X58XXXA Exposure to other specified factors, initial encounter: Secondary | ICD-10-CM | POA: Diagnosis not present

## 2016-11-02 DIAGNOSIS — S22000A Wedge compression fracture of unspecified thoracic vertebra, initial encounter for closed fracture: Secondary | ICD-10-CM

## 2016-11-02 DIAGNOSIS — M4854XA Collapsed vertebra, not elsewhere classified, thoracic region, initial encounter for fracture: Secondary | ICD-10-CM | POA: Insufficient documentation

## 2016-11-02 DIAGNOSIS — M546 Pain in thoracic spine: Secondary | ICD-10-CM

## 2016-11-02 MED ORDER — GADOBENATE DIMEGLUMINE 529 MG/ML IV SOLN
15.0000 mL | Freq: Once | INTRAVENOUS | Status: AC | PRN
  Administered 2016-11-02: 15 mL via INTRAVENOUS

## 2016-11-04 ENCOUNTER — Ambulatory Visit
Admission: RE | Admit: 2016-11-04 | Discharge: 2016-11-04 | Disposition: A | Discharge status: Home / Self Care | Payer: Medicare HMO | Source: Ambulatory Visit | Attending: Radiation Oncology | Admitting: Radiation Oncology

## 2016-11-04 ENCOUNTER — Ambulatory Visit: Payer: Medicare HMO

## 2016-11-04 DIAGNOSIS — Z510 Encounter for antineoplastic radiation therapy: Secondary | ICD-10-CM | POA: Diagnosis not present

## 2016-11-05 ENCOUNTER — Ambulatory Visit
Admission: RE | Admit: 2016-11-05 | Discharge: 2016-11-05 | Disposition: A | Discharge status: Home / Self Care | Payer: Medicare HMO | Source: Ambulatory Visit | Attending: Radiation Oncology | Admitting: Radiation Oncology

## 2016-11-05 ENCOUNTER — Ambulatory Visit: Payer: Medicare HMO

## 2016-11-05 DIAGNOSIS — Z510 Encounter for antineoplastic radiation therapy: Secondary | ICD-10-CM | POA: Diagnosis not present

## 2016-11-06 ENCOUNTER — Ambulatory Visit
Admission: RE | Admit: 2016-11-06 | Discharge: 2016-11-06 | Disposition: A | Discharge status: Home / Self Care | Payer: Medicare HMO | Source: Ambulatory Visit | Attending: Radiation Oncology | Admitting: Radiation Oncology

## 2016-11-06 ENCOUNTER — Ambulatory Visit: Payer: Medicare HMO

## 2016-11-06 DIAGNOSIS — Z510 Encounter for antineoplastic radiation therapy: Secondary | ICD-10-CM | POA: Diagnosis not present

## 2016-11-14 ENCOUNTER — Ambulatory Visit
Admission: RE | Admit: 2016-11-14 | Discharge: 2016-11-14 | Disposition: A | Discharge status: Home / Self Care | Payer: Medicare HMO | Source: Ambulatory Visit | Attending: Radiation Oncology | Admitting: Radiation Oncology

## 2016-11-14 DIAGNOSIS — Z510 Encounter for antineoplastic radiation therapy: Secondary | ICD-10-CM | POA: Diagnosis not present

## 2016-11-15 DIAGNOSIS — Z510 Encounter for antineoplastic radiation therapy: Secondary | ICD-10-CM | POA: Diagnosis not present

## 2016-11-19 NOTE — Progress Notes (Signed)
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  INTERVAL HISTORY: Patient returns to clinic today for further evaluation and consideration of cycle 3 of carboplatinum and etoposide. He has now initiated XRT to his back. His pain is better controlled. He has mild weakness and fatigue. He does not complain of dyspnea on exertion or shortness of breath. His constipation is better controlled as well. He has no neurologic complaints. He denies any recent fevers or illnesses. He has a good appetite and denies weight loss. He has no chest pain, hemoptysis, or cough. He has no nausea, vomiting, or diarrhea. He has no urinary complaints. Patient offers no further specific complaints today.  REVIEW OF SYSTEMS:   Review of Systems  Constitutional: Positive for malaise/fatigue. Negative for fever and weight loss.  Respiratory: Negative for cough, hemoptysis and shortness of breath.   Cardiovascular: Negative.  Negative for chest pain and leg swelling.  Gastrointestinal: Negative for abdominal pain and constipation.  Genitourinary: Negative.   Musculoskeletal: Positive for back pain.  Neurological: Positive for weakness.  Psychiatric/Behavioral: Negative.  The patient is not nervous/anxious.     As per HPI. Otherwise, a complete review of systems is negative.  PAST MEDICAL HISTORY: Past Medical History:  Diagnosis Date  . Anemia   . BPH (benign prostatic hyperplasia)   . Chronic idiopathic urticaria   . Diverticulosis   . Dupuytren's contracture of right hand   . History of hiatal hernia   . Hyperlipemia   . Lung cancer ([place])   . Shingles     PAST SURGICAL HISTORY: Past Surgical History:  Procedure Laterality Date  . CARDIAC  CATHETERIZATION Left 08/12/2016   Procedure: Left Heart Cath and Coronary Angiography;  Surgeon: Teodoro Spray, MD;  Location: [place] [facility];  Service: Cardiovascular;  Laterality: Left;  . COLONOSCOPY    . EUS N/A 02/22/2016   Procedure: UPPER ENDOSCOPIC ULTRASOUND (EUS) LINEAR;  Surgeon: Cora Daniels, MD;  Location: [facility] ENDOSCOPY;  Service: Endoscopy;  Laterality: N/A;  . TONSILLECTOMY      FAMILY HISTORY: Family History  Problem Relation Age of Onset  . Cancer Mother     unknown type  . Lung cancer Father   . Colon cancer Father     ADVANCED DIRECTIVES (Y/N):  N  HEALTH MAINTENANCE: Social History  Substance Use Topics  . Smoking status: Former Smoker    Years: 30.00    Types: Cigarettes    Quit date: 08/12/1981  . Smokeless tobacco: Never Used  . Alcohol use No     Colonoscopy:  PAP:  Bone density:  Lipid panel:  No Known Allergies  Current Outpatient Prescriptions  Medication Sig Dispense Refill  . ALPRAZolam (XANAX) 0.5 MG tablet Take 1 tablet (0.5 mg total) by mouth daily. Take 1 hour before radiation 10 tablet 0  . aspirin EC 81 MG tablet Take 1 tablet by mouth 1 day or 1 dose.    . Cyanocobalamin (RA VITAMIN B-12 TR) 1000 MCG TBCR Take 1 tablet by mouth 1 day or 1 dose.    . diphenhydrAMINE (BENADRYL) 25 mg capsule Take 25 mg by mouth every 6 (six) hours as needed.    . docusate sodium (COLACE) 100 MG capsule Take 1 capsule (100 mg  total) by mouth 2 (two) times daily. 60 capsule 2  . fexofenadine (ALLEGRA) 180 MG tablet Take 1 tablet by mouth 1 day or 1 dose.    Marland Kitchen Fluticasone-Salmeterol (ADVAIR DISKUS) 250-50 MCG/DOSE AEPB Inhale into the lungs.    Marland Kitchen linaclotide (LINZESS) 145 MCG CAPS capsule Take 145 mcg by mouth.    . megestrol (MEGACE) 40 MG tablet Take 1 tablet (40 mg total) by mouth daily. 30 tablet 1  . Multiple Vitamin (MULTIVITAMIN) capsule Take 1 capsule by mouth daily.    . ondansetron (ZOFRAN) 8 MG tablet Take 1 tablet (8 mg total)  by mouth 2 (two) times daily as needed for refractory nausea / vomiting. 30 tablet 1  . Oxycodone HCl 10 MG TABS Take 1-2 tablets (10-20 mg total) by mouth every 4 (four) hours as needed. 60 tablet 0  . Probiotic Product (PROBIOTIC DAILY) CAPS Take 1 capsule by mouth daily.    . prochlorperazine (COMPAZINE) 10 MG tablet Take 1 tablet (10 mg total) by mouth every 6 (six) hours as needed (Nausea or vomiting). 30 tablet 1  . sucralfate (CARAFATE) 1 g tablet Take 1 tablet (1 g total) by mouth 2 (two) times daily. Dissolve in 2-3 tbsp warm water, swish and swallow 60 tablet 3   No current facility-administered medications for this visit.    Facility-Administered Medications Ordered in Other Visits  Medication Dose Route Frequency Provider Last Rate Last Dose  . 0.9 %  sodium chloride infusion  250 mL Intravenous PRN Teodoro Spray, MD      . 0.9 %  sodium chloride infusion  250 mL Intravenous PRN Teodoro Spray, MD      . 0.9% sodium chloride infusion  1 mL/kg/hr Intravenous Continuous Teodoro Spray, MD      . 0.9% sodium chloride infusion  1 mL/kg/hr Intravenous Continuous Teodoro Spray, MD      . sodium chloride flush (NS) 0.9 % injection 3 mL  3 mL Intravenous Q12H Teodoro Spray, MD      . sodium chloride flush (NS) 0.9 % injection 3 mL  3 mL Intravenous PRN Teodoro Spray, MD      . sodium chloride flush (NS) 0.9 % injection 3 mL  3 mL Intravenous Q12H Teodoro Spray, MD      . sodium chloride flush (NS) 0.9 % injection 3 mL  3 mL Intravenous PRN Teodoro Spray, MD        OBJECTIVE: Vitals:   11/20/16 0951  BP: 130/76  Pulse: (!) 101  Resp: 18  Temp: 97.9 F (36.6 C)     Body mass index is 22.38 kg/m.    ECOG FS:0 - Asymptomatic  General: Well-developed, well-nourished, no acute distress. Eyes: Pink conjunctiva, anicteric sclera. Lungs: Clear to auscultation bilaterally. Heart: Regular rate and rhythm. No rubs, murmurs, or gallops. Abdomen: Soft, nontender, nondistended. No  organomegaly noted, normoactive bowel sounds. Musculoskeletal: No edema, cyanosis, or clubbing. Neuro: Alert, answering all questions appropriately. Cranial nerves grossly intact. Skin: No rashes or petechiae noted. Psych: Normal affect.   LAB RESULTS:  Lab Results  Component Value Date   NA 137 11/20/2016   K 3.9 11/20/2016   CL 104 11/20/2016   CO2 26 11/20/2016   GLUCOSE 123 (H) 11/20/2016   BUN 17 11/20/2016   CREATININE 0.87 11/20/2016   CALCIUM 8.6 (L) 11/20/2016   PROT 7.3 11/20/2016   ALBUMIN 4.0 11/20/2016   AST 25 11/20/2016   ALT 12 (L)  11/20/2016   ALKPHOS 64 11/20/2016   BILITOT 0.5 11/20/2016   GFRNONAA >60 11/20/2016   GFRAA >60 11/20/2016    Lab Results  Component Value Date   WBC 8.7 11/20/2016   NEUTROABS 6.6 (H) 11/20/2016   HGB 8.2 (L) 11/20/2016   HCT 23.7 (L) 11/20/2016   MCV 100.4 (H) 11/20/2016   PLT 174 11/20/2016     STUDIES: Mr Thoracic Spine W Wo Contrast  Result Date: 11/02/2016 CLINICAL DATA:  83 year old male with metastatic lung cancer. Osseous metastatic disease on December PET-CT. Thoracic back pain. Subsequent encounter. EXAM: MRI THORACIC WITHOUT AND WITH CONTRAST TECHNIQUE: Multiplanar and multiecho pulse sequences of the thoracic spine were obtained without and with intravenous contrast. CONTRAST:  60m MULTIHANCE GADOBENATE DIMEGLUMINE 529 MG/ML IV SOLN COMPARISON:  Report of [facility] thoracic spine radiographs 10/28/2016 (no images available). CT-guided chest images 1418. PET-CT 09/11/2016. Chest CT 08/26/2016. FINDINGS: Limited sagittal imaging of the cervical spine is grossly normal. Alignment: Except at T12 thoracic vertebral height and alignment appears stable since 08/26/2016. Vertebrae: Vertebral body metastases in the thoracic spine at T1, T2, T5, T9, T10 (series 10, image 4), and T11 (series 10, image 9). Posterior element metastasis in the left T10 pedicle (series 10, image 10). The largest of these metastases is at T1  (16 mm series 7, image 4), but none of these demonstrate epidural or extraosseous tumor extension at this time. Compression fracture of T12 with 50% loss of vertebral body height. Abnormal enhancement throughout the T12 body with a central area of blood products or fluid (series 10, image 8). Mild retropulsion of the posterosuperior T12 endplate resulting in borderline to mild spinal stenosis (series 4, image 8 and series 7, image 37). No epidural or extraosseous tumor at this level. No posterior element involvement (minimal bilateral anterior pedicle involvement). Incidental frequent bilateral thoracic neural foraminal benign perineural cysts or pseudo meningocele is (T2 series 7, image 6 greater on the left. Cord: Spinal cord signal is within normal limits at all visualized levels. No dural thickening. No abnormal intradural enhancement. The conus medullaris is normal at T12-L1. Paraspinal and other soft tissues: Moderate to large layering left pleural effusion (Series 7, image 23). Negative visualized upper abdominal viscera. Negative visualized posterior paraspinal soft tissues. Disc levels: Mild for age thoracic spine degenerative changes, most pronounced at T8-T9 (series 7, image 26) with no significant stenosis. IMPRESSION: 1. Acute to subacute moderate compression fracture of T12 is most likely pathologic given the presence of a right anterior vertebral body metastasis at that level on the December PET-CT. Mild retropulsion of the posterosuperior endplate resulting in borderline to mild spinal stenosis, but no cord compression and no epidural or extraosseous tumor extension. 2. Scattered thoracic spine metastases elsewhere without epidural or extraosseous tumor. The largest metastasis outside of T12 is at T1 measuring 16 mm. 3. Moderate to large layering left pleural effusion. Electronically Signed   By: HGenevie AnnM.D.   On: 11/02/2016 17:17    ASSESSMENT: Stage IV small cell cancer of the left lung with  bony metastasis.  PLAN:    1. 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 Biopsy confirmed stage IV disease with bony metastasis. MRI the brain also reviewed independently revealing metastatic disease. Proceed with cycle 3 of palliative chemotherapy with carboplatinum and etoposide today. Given his advanced age, etoposide was dose reduced. Patient will receive carboplatinum and etoposide on day 1 and  then etoposide only on days 2 and 3. He will also receive Onpro Neulasta support on day 3 of each cycle. He will also receive Zometa approximately every 4 weeks for his bony metastasis.  Patient has declined port placement at this time, but will reconsider in the future. He has expressed interest in continued quality of life over quantity of life.  Return to clinic in 3 weeks for consideration of cycle 4. Will reimage after cycle 4.  2. Hypertension: Patient's blood pressure is within normal limits today, monitor. 3. Pain: Continue fentanyl patch to 50 g every 72 hours and 1-2 tabs of oxycodone as needed. 4. Brain metastasis: Treatment per radiation oncology. Patient has completed palliative XRT. 5. Shortness of breath: Improved. Patient no longer requires home oxygen. 6. Constipation: Patient has been instructed to use stool softeners daily and magnesium citrate as needed. 7. Back pain: MRI of the thoracic spine on November 02, 2016 revealed likely pathologic compression fracture. Continue palliative XRT.  8. Anemia: Likely contributing to patient's weakness and fatigue. Patient may require a blood transfusion in the near future.   Patient expressed understanding and was in agreement with this plan. He also understands that He can call clinic at any time with any questions, concerns, or complaints.   Cancer Staging Small cell lung cancer, left [facility]) Staging form: Lung, AJCC 7th Edition - Clinical stage from  09/25/2016: Stage IV (T2, N2, M1b) - Signed by Lloyd Huger, MD on 09/25/2016   Lloyd Huger, MD   11/24/2016 8:59 AM

## 2016-11-20 ENCOUNTER — Inpatient Hospital Stay: Payer: Medicare HMO

## 2016-11-20 ENCOUNTER — Inpatient Hospital Stay (HOSPITAL_BASED_OUTPATIENT_CLINIC_OR_DEPARTMENT_OTHER): Payer: Medicare HMO | Admitting: Oncology

## 2016-11-20 ENCOUNTER — Inpatient Hospital Stay: Payer: Medicare HMO | Attending: Oncology

## 2016-11-20 VITALS — BP 130/76 | HR 101 | Temp 97.9°F | Resp 18 | Wt 165.0 lb

## 2016-11-20 DIAGNOSIS — D649 Anemia, unspecified: Secondary | ICD-10-CM | POA: Diagnosis not present

## 2016-11-20 DIAGNOSIS — E785 Hyperlipidemia, unspecified: Secondary | ICD-10-CM | POA: Diagnosis not present

## 2016-11-20 DIAGNOSIS — C7931 Secondary malignant neoplasm of brain: Secondary | ICD-10-CM | POA: Insufficient documentation

## 2016-11-20 DIAGNOSIS — C7951 Secondary malignant neoplasm of bone: Secondary | ICD-10-CM | POA: Insufficient documentation

## 2016-11-20 DIAGNOSIS — Z7982 Long term (current) use of aspirin: Secondary | ICD-10-CM | POA: Diagnosis not present

## 2016-11-20 DIAGNOSIS — I10 Essential (primary) hypertension: Secondary | ICD-10-CM | POA: Insufficient documentation

## 2016-11-20 DIAGNOSIS — Z5111 Encounter for antineoplastic chemotherapy: Secondary | ICD-10-CM | POA: Diagnosis present

## 2016-11-20 DIAGNOSIS — R0602 Shortness of breath: Secondary | ICD-10-CM | POA: Diagnosis not present

## 2016-11-20 DIAGNOSIS — C3492 Malignant neoplasm of unspecified part of left bronchus or lung: Secondary | ICD-10-CM

## 2016-11-20 DIAGNOSIS — K5900 Constipation, unspecified: Secondary | ICD-10-CM

## 2016-11-20 DIAGNOSIS — Z87891 Personal history of nicotine dependence: Secondary | ICD-10-CM | POA: Diagnosis not present

## 2016-11-20 DIAGNOSIS — N400 Enlarged prostate without lower urinary tract symptoms: Secondary | ICD-10-CM | POA: Diagnosis not present

## 2016-11-20 DIAGNOSIS — K449 Diaphragmatic hernia without obstruction or gangrene: Secondary | ICD-10-CM | POA: Insufficient documentation

## 2016-11-20 DIAGNOSIS — E860 Dehydration: Secondary | ICD-10-CM | POA: Diagnosis not present

## 2016-11-20 DIAGNOSIS — D6959 Other secondary thrombocytopenia: Secondary | ICD-10-CM | POA: Insufficient documentation

## 2016-11-20 DIAGNOSIS — Z79899 Other long term (current) drug therapy: Secondary | ICD-10-CM

## 2016-11-20 LAB — CBC WITH DIFFERENTIAL/PLATELET
BASOS ABS: 0 10*3/uL (ref 0–0.1)
Basophils Relative: 1 %
Eosinophils Absolute: 0.1 10*3/uL (ref 0–0.7)
Eosinophils Relative: 1 %
HEMATOCRIT: 23.7 % — AB (ref 40.0–52.0)
HEMOGLOBIN: 8.2 g/dL — AB (ref 13.0–18.0)
LYMPHS PCT: 13 %
Lymphs Abs: 1.2 10*3/uL (ref 1.0–3.6)
MCH: 34.6 pg — ABNORMAL HIGH (ref 26.0–34.0)
MCHC: 34.4 g/dL (ref 32.0–36.0)
MCV: 100.4 fL — AB (ref 80.0–100.0)
MONO ABS: 0.7 10*3/uL (ref 0.2–1.0)
Monocytes Relative: 9 %
NEUTROS ABS: 6.6 10*3/uL — AB (ref 1.4–6.5)
NEUTROS PCT: 76 %
Platelets: 174 10*3/uL (ref 150–440)
RBC: 2.36 MIL/uL — AB (ref 4.40–5.90)
RDW: 13.1 % (ref 11.5–14.5)
WBC: 8.7 10*3/uL (ref 3.8–10.6)

## 2016-11-20 LAB — COMPREHENSIVE METABOLIC PANEL
ALBUMIN: 4 g/dL (ref 3.5–5.0)
ALK PHOS: 64 U/L (ref 38–126)
ALT: 12 U/L — AB (ref 17–63)
AST: 25 U/L (ref 15–41)
Anion gap: 7 (ref 5–15)
BILIRUBIN TOTAL: 0.5 mg/dL (ref 0.3–1.2)
BUN: 17 mg/dL (ref 6–20)
CALCIUM: 8.6 mg/dL — AB (ref 8.9–10.3)
CO2: 26 mmol/L (ref 22–32)
CREATININE: 0.87 mg/dL (ref 0.61–1.24)
Chloride: 104 mmol/L (ref 101–111)
GFR calc Af Amer: >60 mL/min (ref >60)
GLUCOSE: 123 mg/dL — AB (ref 65–99)
Potassium: 3.9 mmol/L (ref 3.5–5.1)
Sodium: 137 mmol/L (ref 135–145)
TOTAL PROTEIN: 7.3 g/dL (ref 6.5–8.1)

## 2016-11-20 MED ORDER — DEXAMETHASONE SODIUM PHOSPHATE 10 MG/ML IJ SOLN
10.0000 mg | Freq: Once | INTRAMUSCULAR | Status: AC
  Administered 2016-11-20: 10 mg via INTRAVENOUS
  Filled 2016-11-20: qty 1

## 2016-11-20 MED ORDER — CARBOPLATIN CHEMO INJECTION 600 MG/60ML
436.5000 mg | Freq: Once | INTRAVENOUS | Status: AC
  Administered 2016-11-20: 440 mg via INTRAVENOUS
  Filled 2016-11-20: qty 44

## 2016-11-20 MED ORDER — OXYCODONE HCL 10 MG PO TABS: 10.0000 mg | ORAL_TABLET | ORAL | 0 refills | Status: DC | PRN

## 2016-11-20 MED ORDER — SODIUM CHLORIDE 0.9 % IV SOLN: 10.0000 mg | Freq: Once | INTRAVENOUS | Status: DC

## 2016-11-20 MED ORDER — SODIUM CHLORIDE 0.9 % IV SOLN
80.0000 mg/m2 | Freq: Once | INTRAVENOUS | Status: AC
  Administered 2016-11-20: 160 mg via INTRAVENOUS
  Filled 2016-11-20: qty 8

## 2016-11-20 MED ORDER — SODIUM CHLORIDE 0.9 % IV SOLN
Freq: Once | INTRAVENOUS | Status: AC
  Administered 2016-11-20: 10:00:00 via INTRAVENOUS
  Filled 2016-11-20: qty 1000

## 2016-11-20 MED ORDER — PALONOSETRON HCL INJECTION 0.25 MG/5ML
0.2500 mg | Freq: Once | INTRAVENOUS | Status: AC
  Administered 2016-11-20: 0.25 mg via INTRAVENOUS
  Filled 2016-11-20: qty 5

## 2016-11-20 NOTE — Progress Notes (Signed)
Offers no complaints. Feeling well. 

## 2016-11-21 ENCOUNTER — Other Ambulatory Visit: Payer: Self-pay | Admitting: *Deleted

## 2016-11-21 ENCOUNTER — Ambulatory Visit
Admission: RE | Admit: 2016-11-21 | Discharge: 2016-11-21 | Disposition: A | Discharge status: Home / Self Care | Payer: Medicare HMO | Source: Ambulatory Visit | Attending: Radiation Oncology | Admitting: Radiation Oncology

## 2016-11-21 ENCOUNTER — Inpatient Hospital Stay: Payer: Medicare HMO

## 2016-11-21 VITALS — BP 136/73 | HR 92 | Temp 97.4°F | Resp 18

## 2016-11-21 DIAGNOSIS — Z510 Encounter for antineoplastic radiation therapy: Secondary | ICD-10-CM | POA: Diagnosis not present

## 2016-11-21 DIAGNOSIS — C7931 Secondary malignant neoplasm of brain: Secondary | ICD-10-CM

## 2016-11-21 DIAGNOSIS — Z5111 Encounter for antineoplastic chemotherapy: Secondary | ICD-10-CM | POA: Diagnosis not present

## 2016-11-21 DIAGNOSIS — C3492 Malignant neoplasm of unspecified part of left bronchus or lung: Secondary | ICD-10-CM

## 2016-11-21 MED ORDER — SODIUM CHLORIDE 0.9 % IV SOLN
Freq: Once | INTRAVENOUS | Status: AC
  Administered 2016-11-21: 14:00:00 via INTRAVENOUS
  Filled 2016-11-21: qty 1000

## 2016-11-21 MED ORDER — DEXAMETHASONE SODIUM PHOSPHATE 10 MG/ML IJ SOLN
10.0000 mg | Freq: Once | INTRAMUSCULAR | Status: AC
  Administered 2016-11-21: 10 mg via INTRAVENOUS
  Filled 2016-11-21: qty 1

## 2016-11-21 MED ORDER — SUCRALFATE 1 G PO TABS: 1.0000 g | ORAL_TABLET | Freq: Two times a day (BID) | ORAL | 3 refills | Status: AC

## 2016-11-21 MED ORDER — SODIUM CHLORIDE 0.9 % IV SOLN
80.0000 mg/m2 | Freq: Once | INTRAVENOUS | Status: AC
  Administered 2016-11-21: 160 mg via INTRAVENOUS
  Filled 2016-11-21: qty 8

## 2016-11-22 ENCOUNTER — Inpatient Hospital Stay: Payer: Medicare HMO

## 2016-11-22 VITALS — BP 120/71 | HR 89 | Temp 97.6°F | Resp 18

## 2016-11-22 DIAGNOSIS — Z5111 Encounter for antineoplastic chemotherapy: Secondary | ICD-10-CM | POA: Diagnosis not present

## 2016-11-22 DIAGNOSIS — C3492 Malignant neoplasm of unspecified part of left bronchus or lung: Secondary | ICD-10-CM

## 2016-11-22 MED ORDER — PEGFILGRASTIM 6 MG/0.6ML ~~LOC~~ PSKT
6.0000 mg | PREFILLED_SYRINGE | Freq: Once | SUBCUTANEOUS | Status: AC
  Administered 2016-11-22: 6 mg via SUBCUTANEOUS
  Filled 2016-11-22: qty 0.6

## 2016-11-22 MED ORDER — SODIUM CHLORIDE 0.9 % IV SOLN
80.0000 mg/m2 | Freq: Once | INTRAVENOUS | Status: AC
  Administered 2016-11-22: 160 mg via INTRAVENOUS
  Filled 2016-11-22: qty 8

## 2016-11-22 MED ORDER — SODIUM CHLORIDE 0.9 % IV SOLN
Freq: Once | INTRAVENOUS | Status: AC
  Administered 2016-11-22: 13:00:00 via INTRAVENOUS
  Filled 2016-11-22: qty 1000

## 2016-11-22 MED ORDER — DEXAMETHASONE SODIUM PHOSPHATE 10 MG/ML IJ SOLN
10.0000 mg | Freq: Once | INTRAMUSCULAR | Status: AC
  Administered 2016-11-22: 10 mg via INTRAVENOUS
  Filled 2016-11-22: qty 1

## 2016-11-25 ENCOUNTER — Ambulatory Visit
Admission: RE | Admit: 2016-11-25 | Discharge: 2016-11-25 | Disposition: A | Discharge status: Home / Self Care | Payer: Medicare HMO | Source: Ambulatory Visit | Attending: Radiation Oncology | Admitting: Radiation Oncology

## 2016-11-25 DIAGNOSIS — Z510 Encounter for antineoplastic radiation therapy: Secondary | ICD-10-CM | POA: Diagnosis not present

## 2016-11-26 ENCOUNTER — Ambulatory Visit
Admission: RE | Admit: 2016-11-26 | Discharge: 2016-11-26 | Disposition: A | Discharge status: Home / Self Care | Payer: Medicare HMO | Source: Ambulatory Visit | Attending: Radiation Oncology | Admitting: Radiation Oncology

## 2016-11-26 DIAGNOSIS — Z510 Encounter for antineoplastic radiation therapy: Secondary | ICD-10-CM | POA: Diagnosis not present

## 2016-11-27 ENCOUNTER — Ambulatory Visit
Admission: RE | Admit: 2016-11-27 | Discharge: 2016-11-27 | Disposition: A | Discharge status: Home / Self Care | Payer: Medicare HMO | Source: Ambulatory Visit | Attending: Radiation Oncology | Admitting: Radiation Oncology

## 2016-11-27 DIAGNOSIS — Z510 Encounter for antineoplastic radiation therapy: Secondary | ICD-10-CM | POA: Diagnosis not present

## 2016-11-28 ENCOUNTER — Ambulatory Visit
Admission: RE | Admit: 2016-11-28 | Discharge: 2016-11-28 | Disposition: A | Discharge status: Home / Self Care | Payer: Medicare HMO | Source: Ambulatory Visit | Attending: Radiation Oncology | Admitting: Radiation Oncology

## 2016-11-28 DIAGNOSIS — Z510 Encounter for antineoplastic radiation therapy: Secondary | ICD-10-CM | POA: Diagnosis not present

## 2016-11-29 ENCOUNTER — Ambulatory Visit
Admission: RE | Admit: 2016-11-29 | Discharge: 2016-11-29 | Disposition: A | Discharge status: Home / Self Care | Payer: Medicare HMO | Source: Ambulatory Visit | Attending: Radiation Oncology | Admitting: Radiation Oncology

## 2016-11-29 DIAGNOSIS — Z510 Encounter for antineoplastic radiation therapy: Secondary | ICD-10-CM | POA: Diagnosis not present

## 2016-12-02 ENCOUNTER — Ambulatory Visit: Admission: RE | Admit: 2016-12-02 | Payer: Medicare HMO | Source: Ambulatory Visit

## 2016-12-02 ENCOUNTER — Ambulatory Visit: Payer: Medicare HMO | Admitting: Radiation Oncology

## 2016-12-02 ENCOUNTER — Inpatient Hospital Stay: Payer: Medicare HMO

## 2016-12-02 ENCOUNTER — Other Ambulatory Visit: Payer: Self-pay | Admitting: *Deleted

## 2016-12-02 ENCOUNTER — Ambulatory Visit: Payer: Medicare HMO

## 2016-12-02 VITALS — BP 129/77 | HR 85 | Temp 96.8°F | Resp 17

## 2016-12-02 DIAGNOSIS — C3492 Malignant neoplasm of unspecified part of left bronchus or lung: Secondary | ICD-10-CM

## 2016-12-02 DIAGNOSIS — Z5111 Encounter for antineoplastic chemotherapy: Secondary | ICD-10-CM | POA: Diagnosis not present

## 2016-12-02 LAB — CBC WITH DIFFERENTIAL/PLATELET
Basophils Absolute: 0 10*3/uL (ref 0–0.1)
Basophils Relative: 1 %
Eosinophils Absolute: 0 10*3/uL (ref 0–0.7)
Eosinophils Relative: 1 %
HEMATOCRIT: 17.7 % — AB (ref 40.0–52.0)
HEMOGLOBIN: 6.2 g/dL — AB (ref 13.0–18.0)
Lymphocytes Relative: 16 %
Lymphs Abs: 0.8 10*3/uL — ABNORMAL LOW (ref 1.0–3.6)
MCH: 35.3 pg — AB (ref 26.0–34.0)
MCHC: 34.7 g/dL (ref 32.0–36.0)
MCV: 101.7 fL — ABNORMAL HIGH (ref 80.0–100.0)
MONO ABS: 0.3 10*3/uL (ref 0.2–1.0)
MONOS PCT: 6 %
NEUTROS PCT: 76 %
Neutro Abs: 3.7 10*3/uL (ref 1.4–6.5)
Platelets: 32 10*3/uL — ABNORMAL LOW (ref 150–440)
RBC: 1.74 MIL/uL — ABNORMAL LOW (ref 4.40–5.90)
RDW: 16.8 % — ABNORMAL HIGH (ref 11.5–14.5)
WBC: 4.8 10*3/uL (ref 3.8–10.6)

## 2016-12-02 LAB — COMPREHENSIVE METABOLIC PANEL
ALBUMIN: 3.8 g/dL (ref 3.5–5.0)
ALK PHOS: 51 U/L (ref 38–126)
ALT: 12 U/L — ABNORMAL LOW (ref 17–63)
AST: 25 U/L (ref 15–41)
Anion gap: 4 — ABNORMAL LOW (ref 5–15)
BILIRUBIN TOTAL: 0.5 mg/dL (ref 0.3–1.2)
BUN: 21 mg/dL — AB (ref 6–20)
CALCIUM: 8.5 mg/dL — AB (ref 8.9–10.3)
CO2: 27 mmol/L (ref 22–32)
Chloride: 105 mmol/L (ref 101–111)
Creatinine, Ser: 0.88 mg/dL (ref 0.61–1.24)
GFR calc Af Amer: >60 mL/min (ref >60)
GFR calc non Af Amer: >60 mL/min (ref >60)
Glucose, Bld: 109 mg/dL — ABNORMAL HIGH (ref 65–99)
Potassium: 4.1 mmol/L (ref 3.5–5.1)
Sodium: 136 mmol/L (ref 135–145)
TOTAL PROTEIN: 6.5 g/dL (ref 6.5–8.1)

## 2016-12-02 LAB — PREPARE RBC (CROSSMATCH)

## 2016-12-02 LAB — ABO/RH: ABO/RH(D): O POS

## 2016-12-02 MED ORDER — DIPHENHYDRAMINE HCL 25 MG PO CAPS
25.0000 mg | ORAL_CAPSULE | Freq: Once | ORAL | Status: AC
  Administered 2016-12-02: 25 mg via ORAL
  Filled 2016-12-02: qty 1

## 2016-12-02 MED ORDER — SODIUM CHLORIDE 0.9 % IV SOLN
Freq: Once | INTRAVENOUS | Status: AC
  Administered 2016-12-02: 09:00:00 via INTRAVENOUS
  Filled 2016-12-02: qty 1000

## 2016-12-02 MED ORDER — SODIUM CHLORIDE 0.9 % IV SOLN
250.0000 mL | Freq: Once | INTRAVENOUS | Status: AC
  Administered 2016-12-02: 250 mL via INTRAVENOUS
  Filled 2016-12-02: qty 250

## 2016-12-02 MED ORDER — ACETAMINOPHEN 325 MG PO TABS
650.0000 mg | ORAL_TABLET | Freq: Once | ORAL | Status: AC
  Administered 2016-12-02: 650 mg via ORAL
  Filled 2016-12-02: qty 2

## 2016-12-02 NOTE — Patient Instructions (Signed)

## 2016-12-03 ENCOUNTER — Inpatient Hospital Stay: Payer: Medicare HMO

## 2016-12-03 ENCOUNTER — Ambulatory Visit
Admission: RE | Admit: 2016-12-03 | Discharge: 2016-12-03 | Disposition: A | Discharge status: Home / Self Care | Payer: Medicare HMO | Source: Ambulatory Visit | Attending: Radiation Oncology | Admitting: Radiation Oncology

## 2016-12-03 DIAGNOSIS — Z510 Encounter for antineoplastic radiation therapy: Secondary | ICD-10-CM | POA: Diagnosis not present

## 2016-12-03 LAB — TYPE AND SCREEN
ABO/RH(D): O POS
Antibody Screen: NEGATIVE
UNIT DIVISION: 0
Unit division: 0
Unit division: 0

## 2016-12-03 LAB — BPAM RBC
BLOOD PRODUCT EXPIRATION DATE: 201803272359
Blood Product Expiration Date: 201803212359
Blood Product Expiration Date: 201804092359
ISSUE DATE / TIME: 201803191406
ISSUE DATE / TIME: 201803191612
ISSUE DATE / TIME: 201803191223
UNIT TYPE AND RH: 5100
UNIT TYPE AND RH: 9500
Unit Type and Rh: 5100

## 2016-12-04 ENCOUNTER — Ambulatory Visit
Admission: RE | Admit: 2016-12-04 | Discharge: 2016-12-04 | Disposition: A | Discharge status: Home / Self Care | Payer: Medicare HMO | Source: Ambulatory Visit | Attending: Radiation Oncology | Admitting: Radiation Oncology

## 2016-12-04 DIAGNOSIS — Z510 Encounter for antineoplastic radiation therapy: Secondary | ICD-10-CM | POA: Diagnosis not present

## 2016-12-05 ENCOUNTER — Ambulatory Visit: Payer: Medicare HMO

## 2016-12-05 ENCOUNTER — Inpatient Hospital Stay: Payer: Medicare HMO

## 2016-12-05 ENCOUNTER — Inpatient Hospital Stay (HOSPITAL_BASED_OUTPATIENT_CLINIC_OR_DEPARTMENT_OTHER): Payer: Medicare HMO | Admitting: Hematology and Oncology

## 2016-12-05 VITALS — BP 114/68 | HR 101 | Resp 18 | Wt 165.5 lb

## 2016-12-05 DIAGNOSIS — D649 Anemia, unspecified: Secondary | ICD-10-CM

## 2016-12-05 DIAGNOSIS — C7931 Secondary malignant neoplasm of brain: Secondary | ICD-10-CM

## 2016-12-05 DIAGNOSIS — C7951 Secondary malignant neoplasm of bone: Secondary | ICD-10-CM | POA: Diagnosis not present

## 2016-12-05 DIAGNOSIS — E860 Dehydration: Secondary | ICD-10-CM | POA: Diagnosis not present

## 2016-12-05 DIAGNOSIS — D696 Thrombocytopenia, unspecified: Secondary | ICD-10-CM

## 2016-12-05 DIAGNOSIS — K5900 Constipation, unspecified: Secondary | ICD-10-CM

## 2016-12-05 DIAGNOSIS — Z79899 Other long term (current) drug therapy: Secondary | ICD-10-CM

## 2016-12-05 DIAGNOSIS — D6959 Other secondary thrombocytopenia: Secondary | ICD-10-CM

## 2016-12-05 DIAGNOSIS — R0602 Shortness of breath: Secondary | ICD-10-CM | POA: Diagnosis not present

## 2016-12-05 DIAGNOSIS — C3492 Malignant neoplasm of unspecified part of left bronchus or lung: Secondary | ICD-10-CM | POA: Diagnosis not present

## 2016-12-05 DIAGNOSIS — Z5111 Encounter for antineoplastic chemotherapy: Secondary | ICD-10-CM | POA: Diagnosis not present

## 2016-12-05 LAB — CBC WITH DIFFERENTIAL/PLATELET
BASOS PCT: 0 %
Basophils Absolute: 0 10*3/uL (ref 0–0.1)
EOS ABS: 0.1 10*3/uL (ref 0–0.7)
Eosinophils Relative: 1 %
HEMATOCRIT: 22.8 % — AB (ref 40.0–52.0)
HEMOGLOBIN: 8 g/dL — AB (ref 13.0–18.0)
LYMPHS ABS: 0.5 10*3/uL — AB (ref 1.0–3.6)
Lymphocytes Relative: 9 %
MCH: 33.5 pg (ref 26.0–34.0)
MCHC: 35.1 g/dL (ref 32.0–36.0)
MCV: 95.5 fL (ref 80.0–100.0)
MONOS PCT: 8 %
Monocytes Absolute: 0.4 10*3/uL (ref 0.2–1.0)
NEUTROS ABS: 4.4 10*3/uL (ref 1.4–6.5)
Neutrophils Relative %: 82 %
Platelets: 17 10*3/uL — CL (ref 150–440)
RBC: 2.38 MIL/uL — AB (ref 4.40–5.90)
RDW: 16.1 % — ABNORMAL HIGH (ref 11.5–14.5)
WBC: 5.4 10*3/uL (ref 3.8–10.6)

## 2016-12-05 LAB — COMPREHENSIVE METABOLIC PANEL
ALBUMIN: 4 g/dL (ref 3.5–5.0)
ALT: 12 U/L — AB (ref 17–63)
AST: 24 U/L (ref 15–41)
Alkaline Phosphatase: 56 U/L (ref 38–126)
Anion gap: 6 (ref 5–15)
BILIRUBIN TOTAL: 0.6 mg/dL (ref 0.3–1.2)
BUN: 16 mg/dL (ref 6–20)
CALCIUM: 8.5 mg/dL — AB (ref 8.9–10.3)
CO2: 24 mmol/L (ref 22–32)
CREATININE: 0.85 mg/dL (ref 0.61–1.24)
Chloride: 105 mmol/L (ref 101–111)
GFR calc Af Amer: >60 mL/min (ref >60)
GFR calc non Af Amer: >60 mL/min (ref >60)
GLUCOSE: 90 mg/dL (ref 65–99)
Potassium: 3.8 mmol/L (ref 3.5–5.1)
SODIUM: 135 mmol/L (ref 135–145)
TOTAL PROTEIN: 6.9 g/dL (ref 6.5–8.1)

## 2016-12-05 LAB — SAMPLE TO BLOOD BANK

## 2016-12-05 LAB — PREPARE RBC (CROSSMATCH)

## 2016-12-05 MED ORDER — ACETAMINOPHEN 325 MG PO TABS
650.0000 mg | ORAL_TABLET | Freq: Once | ORAL | Status: AC
  Administered 2016-12-05: 650 mg via ORAL
  Filled 2016-12-05: qty 2

## 2016-12-05 MED ORDER — DIPHENHYDRAMINE HCL 25 MG PO CAPS
25.0000 mg | ORAL_CAPSULE | Freq: Once | ORAL | Status: AC
  Administered 2016-12-05: 25 mg via ORAL
  Filled 2016-12-05: qty 1

## 2016-12-05 MED ORDER — SODIUM CHLORIDE 0.9 % IV SOLN
250.0000 mL | Freq: Once | INTRAVENOUS | Status: AC
  Administered 2016-12-05: 250 mL via INTRAVENOUS
  Filled 2016-12-05: qty 250

## 2016-12-05 NOTE — Progress Notes (Signed)
Patient acute add on today.  Sees Dr. Grayland Ormond.  States he is weak, lightheaded and fatigued.  Patient got 2 units pRBC's on Monday.  States he feels no better. Dx: small cell lung cancer.

## 2016-12-05 NOTE — Progress Notes (Signed)
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  Acute add-on .  He is day 14 s/p cycle #3 carboplatin and etoposide with Neulasta support.   INTERVAL HISTORY: Patient returns to clinic today for further evaluation.  He has felt weak and fatigued since day 9 of cycle #3.  He received 2 units of pRBCs on 12/02/2016 for Hgb 6.2 and HCT 17.7.    He complais of increasing fatigue, weakness, and dizziness.  He skipped his XRT today.  His pain is better controlled.  His fatigue and weakness have mildly improved.  He has dizziness with position changes and looking/reaching up.  He has dyspnea on exertion. Constipation is controlled with milk of magnesia. He denies any neurologic complaints. He denies any fevers or illnesses. He has a good appetite and denies weight loss. He has no chest pain, hemoptysis, or cough. He reports occasional nausea.  He has had no vomiting or diarrhea. He has no urinary complaints.  He reports an area of redness and swelling on left forearm. Patient offers no further specific complaints today.  REVIEW OF SYSTEMS:   Review of Systems  Constitutional: Positive for malaise/fatigue. Negative for chills, diaphoresis, fever and weight loss.  HENT: Negative for congestion, ear pain, nosebleeds and sore throat.   Eyes: Negative for blurred vision and double vision.  Respiratory: Positive for shortness of breath. Negative for cough, hemoptysis and sputum production.   Cardiovascular: Negative for chest pain, palpitations, orthopnea, leg swelling and PND.  Gastrointestinal: Positive for nausea. Negative for abdominal pain, blood in stool, constipation, diarrhea, melena and vomiting.  Genitourinary: Negative for dysuria, frequency,  hematuria and urgency.  Musculoskeletal: Negative for back pain, joint pain and neck pain.  Skin: Negative for itching and rash.       Slight redness & swelling below PIV insertion site on left forearm  Neurological: Positive for dizziness and weakness. Negative for tingling, sensory change, focal weakness and headaches.  Endo/Heme/Allergies: Does not bruise/bleed easily.  Psychiatric/Behavioral: Negative for depression. The patient is not nervous/anxious and does not have insomnia.     As per HPI. Otherwise, a complete review of systems is negative.  PAST MEDICAL HISTORY: Past Medical History:  Diagnosis Date  . Anemia   . BPH (benign prostatic hyperplasia)   . Chronic idiopathic urticaria   . Diverticulosis   . Dupuytren's contracture of right hand   . History of hiatal hernia   . Hyperlipemia   . Lung cancer ([place])   . Shingles     PAST SURGICAL HISTORY: Past Surgical History:  Procedure Laterality Date  . CARDIAC CATHETERIZATION Left 08/12/2016   Procedure: Left Heart Cath and Coronary Angiography;  Surgeon: Teodoro Spray, MD;  Location: [facility];  Service: Cardiovascular;  Laterality: Left;  . COLONOSCOPY    . EUS N/A 02/22/2016   Procedure: UPPER ENDOSCOPIC ULTRASOUND (EUS) LINEAR;  Surgeon: Cora Daniels, MD;  Location: [facility];  Service: Endoscopy;  Laterality: N/A;  . TONSILLECTOMY      FAMILY HISTORY: Family History  Problem Relation Age of Onset  . Cancer Mother     unknown type  . Lung cancer Father   . Colon cancer Father     ADVANCED DIRECTIVES (Y/N):  N  HEALTH  MAINTENANCE: Social History  Substance Use Topics  . Smoking status: Former Smoker    Years: 30.00    Types: Cigarettes    Quit date: 08/12/1981  . Smokeless tobacco: Never Used  . Alcohol use No     Colonoscopy:  PAP:  Bone density:  Lipid panel:  No Known Allergies  Current Outpatient Prescriptions  Medication Sig Dispense Refill  . ALPRAZolam (XANAX)  0.5 MG tablet Take 1 tablet (0.5 mg total) by mouth daily. Take 1 hour before radiation 10 tablet 0  . aspirin EC 81 MG tablet Take 1 tablet by mouth 1 day or 1 dose.    . Cyanocobalamin (RA VITAMIN B-12 TR) 1000 MCG TBCR Take 1 tablet by mouth 1 day or 1 dose.    . diphenhydrAMINE (BENADRYL) 25 mg capsule Take 25 mg by mouth every 6 (six) hours as needed.    . docusate sodium (COLACE) 100 MG capsule Take 1 capsule (100 mg total) by mouth 2 (two) times daily. 60 capsule 2  . fexofenadine (ALLEGRA) 180 MG tablet Take 1 tablet by mouth 1 day or 1 dose.    Marland Kitchen Fluticasone-Salmeterol (ADVAIR DISKUS) 250-50 MCG/DOSE AEPB Inhale into the lungs.    Marland Kitchen linaclotide (LINZESS) 145 MCG CAPS capsule Take 145 mcg by mouth.    . megestrol (MEGACE) 40 MG tablet Take 1 tablet (40 mg total) by mouth daily. 30 tablet 1  . Multiple Vitamin (MULTIVITAMIN) capsule Take 1 capsule by mouth daily.    . ondansetron (ZOFRAN) 8 MG tablet Take 1 tablet (8 mg total) by mouth 2 (two) times daily as needed for refractory nausea / vomiting. 30 tablet 1  . Oxycodone HCl 10 MG TABS Take 1-2 tablets (10-20 mg total) by mouth every 4 (four) hours as needed. 60 tablet 0  . Probiotic Product (PROBIOTIC DAILY) CAPS Take 1 capsule by mouth daily.    . prochlorperazine (COMPAZINE) 10 MG tablet Take 1 tablet (10 mg total) by mouth every 6 (six) hours as needed (Nausea or vomiting). 30 tablet 1  . sucralfate (CARAFATE) 1 g tablet Take 1 tablet (1 g total) by mouth 2 (two) times daily. Dissolve in 2-3 tbsp warm water, swish and swallow 60 tablet 3   No current facility-administered medications for this visit.    Facility-Administered Medications Ordered in Other Visits  Medication Dose Route Frequency Provider Last Rate Last Dose  . 0.9 %  sodium chloride infusion  250 mL Intravenous PRN Teodoro Spray, MD      . 0.9 %  sodium chloride infusion  250 mL Intravenous PRN Teodoro Spray, MD      . 0.9 %  sodium chloride infusion  250 mL  Intravenous Once Lequita Asal, MD      . 0.9% sodium chloride infusion  1 mL/kg/hr Intravenous Continuous Teodoro Spray, MD      . 0.9% sodium chloride infusion  1 mL/kg/hr Intravenous Continuous Teodoro Spray, MD      . acetaminophen (TYLENOL) tablet 650 mg  650 mg Oral Once Lequita Asal, MD      . diphenhydrAMINE (BENADRYL) capsule 25 mg  25 mg Oral Once Lequita Asal, MD      . sodium chloride flush (NS) 0.9 % injection 3 mL  3 mL Intravenous Q12H Teodoro Spray, MD      . sodium chloride flush (NS) 0.9 % injection 3 mL  3 mL Intravenous PRN Teodoro Spray, MD      .  sodium chloride flush (NS) 0.9 % injection 3 mL  3 mL Intravenous Q12H Teodoro Spray, MD      . sodium chloride flush (NS) 0.9 % injection 3 mL  3 mL Intravenous PRN Teodoro Spray, MD        OBJECTIVE: Vitals:   12/05/16 0958 12/05/16 1052  BP: 137/77 114/68  Pulse: (!) 105 (!) 101  Resp: 18      Body mass index is 22.45 kg/m.    ECOG FS:0 - Asymptomatic   GENERAL:  Well developed, well nourished, elderly sitting comfortably in the exam room in no acute distress. MENTAL STATUS:  Alert and oriented to person, place and time. HEAD:  Normocephalic, atraumatic, face symmetric, no Cushingoid features. EYES:  Pupils equal round and reactive to light and accomodation.  No conjunctivitis or scleral icterus. ENT:  Oropharynx with palatal petechiae and left buccal mucosa hemorrhage.  Tongue normal. Mucous membranes moist.  RESPIRATORY:  Clear to auscultation without rales, wheezes or rhonchi. CARDIOVASCULAR:  Regular rate and rhythm without murmur, rub or gallop. ABDOMEN:  Soft, non-tender, with active bowel sounds, and no hepatosplenomegaly.  No masses. SKIN:  No rashes, ulcers or lesions. EXTREMITIES: left forearm with small area of thrombophlebitis.  No edema, no skin discoloration or tenderness.  No palpable cords. LYMPH NODES: No palpable cervical, supraclavicular, axillary or inguinal adenopathy    NEUROLOGICAL: Unremarkable. PSYCH:  Appropriate.   LAB RESULTS:  Lab Results  Component Value Date   NA 135 12/05/2016   K 3.8 12/05/2016   CL 105 12/05/2016   CO2 24 12/05/2016   GLUCOSE 90 12/05/2016   BUN 16 12/05/2016   CREATININE 0.85 12/05/2016   CALCIUM 8.5 (L) 12/05/2016   PROT 6.9 12/05/2016   ALBUMIN 4.0 12/05/2016   AST 24 12/05/2016   ALT 12 (L) 12/05/2016   ALKPHOS 56 12/05/2016   BILITOT 0.6 12/05/2016   GFRNONAA >60 12/05/2016   GFRAA >60 12/05/2016    Lab Results  Component Value Date   WBC 5.4 12/05/2016   NEUTROABS 4.4 12/05/2016   HGB 8.0 (L) 12/05/2016   HCT 22.8 (L) 12/05/2016   MCV 95.5 12/05/2016   PLT 17 (LL) 12/05/2016     STUDIES: No results found.  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 Biopsy confirmed stage IV disease with bony metastasis. MRI the brain also reviewed independently revealing metastatic disease.  Cycle 3 of palliative chemotherapy with carboplatin and etoposide administered from 11/20/2016 - 11/22/2016. Given his advanced age, etoposide was dose reduced. Patient receiving carboplatinum and etoposide on day 1 and then etoposide only on days 2 and 3. He will also receive Onpro Neulasta support on day 3 of each cycle. He will also receive Zometa approximately every 4 weeks for his bony metastasis.  Patient has declined port placement. He has expressed interest in continued quality of life over quantity of life.    1. Labs today: CBC with diff, CMP, type & screen. 2. Anemia/Thrombocytopenia/Dehydration: Hgb 8.0, Hct 22.8, PLT 17.       Administer 1 unit pheresed platelets given mucosal bleeding and 1 unit pRBC given weakness and dizziness.        Administer IVF today. 3. RTC tomorrow for labs (CBC with diff), MD assessment  +/- PLT transfusion 4. RTC  on 12/09/2016 for  labs (CBC with diff, hold tube) +/- transfusion 5.  Follow-up with Dr. Grayland Ormond for assessment, labs, and consideration of cycle #4 carboplatin and etoposide on 12/11/2016.   Cancer Staging Small cell lung cancer, left [facility]) Staging form: Lung, AJCC 7th Edition - Clinical stage from 09/25/2016: Stage IV (T2, N2, M1b) - Signed by Lloyd Huger, MD on 09/25/2016   Lucendia Herrlich, NP   12/05/2016 12:29 PM  I saw and evaluated the patient, participating in the key portions of the service and reviewing pertinent diagnostic studies and records.  I reviewed the nurse practitioner's note and agree with the findings and the plan.  The assessment and plan were discussed with the patient.  Multiple questions were asked by the patient and answered.    Lequita Asal, MD

## 2016-12-06 ENCOUNTER — Inpatient Hospital Stay (HOSPITAL_BASED_OUTPATIENT_CLINIC_OR_DEPARTMENT_OTHER): Payer: Medicare HMO | Admitting: Hematology and Oncology

## 2016-12-06 ENCOUNTER — Encounter: Payer: Self-pay | Admitting: Hematology and Oncology

## 2016-12-06 ENCOUNTER — Inpatient Hospital Stay: Payer: Medicare HMO

## 2016-12-06 ENCOUNTER — Ambulatory Visit: Admission: RE | Admit: 2016-12-06 | Payer: Medicare HMO | Source: Ambulatory Visit

## 2016-12-06 ENCOUNTER — Ambulatory Visit: Payer: Medicare HMO

## 2016-12-06 VITALS — BP 132/75 | HR 106 | Temp 96.2°F | Resp 18 | Wt 166.3 lb

## 2016-12-06 DIAGNOSIS — E860 Dehydration: Secondary | ICD-10-CM

## 2016-12-06 DIAGNOSIS — D6959 Other secondary thrombocytopenia: Secondary | ICD-10-CM

## 2016-12-06 DIAGNOSIS — C7931 Secondary malignant neoplasm of brain: Secondary | ICD-10-CM

## 2016-12-06 DIAGNOSIS — C7951 Secondary malignant neoplasm of bone: Secondary | ICD-10-CM | POA: Diagnosis not present

## 2016-12-06 DIAGNOSIS — C3492 Malignant neoplasm of unspecified part of left bronchus or lung: Secondary | ICD-10-CM

## 2016-12-06 DIAGNOSIS — R0602 Shortness of breath: Secondary | ICD-10-CM

## 2016-12-06 DIAGNOSIS — Z79899 Other long term (current) drug therapy: Secondary | ICD-10-CM

## 2016-12-06 DIAGNOSIS — D696 Thrombocytopenia, unspecified: Secondary | ICD-10-CM

## 2016-12-06 DIAGNOSIS — K5900 Constipation, unspecified: Secondary | ICD-10-CM

## 2016-12-06 DIAGNOSIS — D649 Anemia, unspecified: Secondary | ICD-10-CM | POA: Diagnosis not present

## 2016-12-06 DIAGNOSIS — Z5111 Encounter for antineoplastic chemotherapy: Secondary | ICD-10-CM | POA: Diagnosis not present

## 2016-12-06 LAB — CBC WITH DIFFERENTIAL/PLATELET
Basophils Absolute: 0 K/uL (ref 0–0.1)
Basophils Relative: 0 %
Eosinophils Absolute: 0.1 K/uL (ref 0–0.7)
Eosinophils Relative: 1 %
HCT: 25.4 % — ABNORMAL LOW (ref 40.0–52.0)
Hemoglobin: 9 g/dL — ABNORMAL LOW (ref 13.0–18.0)
Lymphocytes Relative: 11 %
Lymphs Abs: 0.7 K/uL — ABNORMAL LOW (ref 1.0–3.6)
MCH: 33.3 pg (ref 26.0–34.0)
MCHC: 35.5 g/dL (ref 32.0–36.0)
MCV: 93.8 fL (ref 80.0–100.0)
Monocytes Absolute: 0.6 K/uL (ref 0.2–1.0)
Monocytes Relative: 9 %
Neutro Abs: 4.9 K/uL (ref 1.4–6.5)
Neutrophils Relative %: 79 %
Platelets: 72 K/uL — ABNORMAL LOW (ref 150–440)
RBC: 2.71 MIL/uL — ABNORMAL LOW (ref 4.40–5.90)
RDW: 15.8 % — ABNORMAL HIGH (ref 11.5–14.5)
WBC: 6.2 K/uL (ref 3.8–10.6)

## 2016-12-06 LAB — BPAM RBC
Blood Product Expiration Date: 201803272359
ISSUE DATE / TIME: 201803221305
Unit Type and Rh: 9500

## 2016-12-06 LAB — PREPARE PLATELET PHERESIS: Unit division: 0

## 2016-12-06 LAB — TYPE AND SCREEN
ABO/RH(D): O POS
Antibody Screen: NEGATIVE
Unit division: 0

## 2016-12-06 LAB — BPAM PLATELET PHERESIS
Blood Product Expiration Date: 201803232359
ISSUE DATE / TIME: 201803221513
Unit Type and Rh: 5100

## 2016-12-06 NOTE — Progress Notes (Signed)
Patient nauseated today.  Still feels very weak.

## 2016-12-06 NOTE — Progress Notes (Signed)
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 Reassessment on day 15 of cycle #3 carboplatin and etoposide.   INTERVAL HISTORY: Patient returns to clinic today for further evaluation status post 1 unit of pRBCs and 1 unit of PLT on 12/05/2016 for Hgb 8.0, HCT 22.8, and PLT 17.  He also received NS IVF. He had been complaining of increasing fatigue, weakness, and dizziness. He skipped his scheduled XRTon 12/05/2016 secondary to fatigue and low counts.    Symptomatically, he feels slightly better today, noting less dizziness.  He drank more fluids yesterday.  He reports nausea this morning, controlled with Zofran. His appetite is reduced.  He drank Ensure this morning.  Weight is stable.   He denies any vomiting or diarrhea. He reports new seasonal runny nose, cough, and congestion producing white phlegm.  He reports an area the area of redness and swelling on left forearm is unchanged. Patient offers no further specific complaints today.  REVIEW OF SYSTEMS:   Review of Systems  Constitutional: Positive for malaise/fatigue. Negative for chills, fever and weight loss.  HENT: Negative for congestion, ear pain, nosebleeds, sinus pain and sore throat.   Eyes: Negative for blurred vision and double vision.  Respiratory: Positive for shortness of breath. Negative for cough, hemoptysis and sputum production.   Cardiovascular: Negative for chest pain, orthopnea, leg swelling and PND.       Bilateral ankle edema  Gastrointestinal: Positive for constipation and nausea. Negative for abdominal pain, blood in stool, diarrhea, melena and vomiting.  Genitourinary: Negative for dysuria, frequency, hematuria and urgency.  Musculoskeletal: Negative for back  pain, falls and neck pain.  Skin: Negative for itching and rash.       Minimal redness and swelling below PIV insertion site on left forearm (stable).  Neurological: Positive for dizziness and weakness. Negative for tingling, sensory change, focal weakness and headaches.  Endo/Heme/Allergies: Does not bruise/bleed easily.  Psychiatric/Behavioral: Negative for depression. The patient is not nervous/anxious and does not have insomnia.     As per HPI. Otherwise, a complete review of systems is negative.  PAST MEDICAL HISTORY: Past Medical History:  Diagnosis Date  . Anemia   . BPH (benign prostatic hyperplasia)   . Chronic idiopathic urticaria   . Diverticulosis   . Dupuytren's contracture of right hand   . History of hiatal hernia   . Hyperlipemia   . Lung cancer ([place])   . Shingles     PAST SURGICAL HISTORY: Past Surgical History:  Procedure Laterality Date  . CARDIAC CATHETERIZATION Left 08/12/2016   Procedure: Left Heart Cath and Coronary Angiography;  Surgeon: Teodoro Spray, MD;  Location: [place] [facility];  Service: Cardiovascular;  Laterality: Left;  . COLONOSCOPY    . EUS N/A 02/22/2016   Procedure: UPPER ENDOSCOPIC ULTRASOUND (EUS) LINEAR;  Surgeon: Cora Daniels, MD;  Location: [facility] END[facility];  Service: Endoscopy;  Laterality: N/A;  . TONSILLECTOMY      FAMILY HISTORY: Family History  Problem Relation Age of Onset  . Cancer Mother     unknown type  . Lung cancer Father   . Colon cancer Father     ADVANCED DIRECTIVES (Y/N):  N  HEALTH MAINTENANCE: Social History  Substance Use Topics  . Smoking status:  Former Smoker    Years: 30.00    Types: Cigarettes    Quit date: 08/12/1981  . Smokeless tobacco: Never Used  . Alcohol use No     Colonoscopy:  PAP:  Bone density:  Lipid panel:  No Known Allergies  Current Outpatient Prescriptions  Medication Sig Dispense Refill  . aspirin EC 81 MG tablet Take 1 tablet by mouth 1 day or 1 dose.    .  Cyanocobalamin (RA VITAMIN B-12 TR) 1000 MCG TBCR Take 1 tablet by mouth 1 day or 1 dose.    . docusate sodium (COLACE) 100 MG capsule Take 1 capsule (100 mg total) by mouth 2 (two) times daily. 60 capsule 2  . linaclotide (LINZESS) 145 MCG CAPS capsule Take 145 mcg by mouth.    . loratadine (CLARITIN) 10 MG tablet Take 10 mg by mouth daily.    . megestrol (MEGACE) 40 MG tablet Take 1 tablet (40 mg total) by mouth daily. 30 tablet 1  . Multiple Vitamin (MULTIVITAMIN) capsule Take 1 capsule by mouth daily.    . ondansetron (ZOFRAN) 8 MG tablet Take 1 tablet (8 mg total) by mouth 2 (two) times daily as needed for refractory nausea / vomiting. 30 tablet 1  . Oxycodone HCl 10 MG TABS Take 1-2 tablets (10-20 mg total) by mouth every 4 (four) hours as needed. 60 tablet 0  . Probiotic Product (PROBIOTIC DAILY) CAPS Take 1 capsule by mouth daily.    . prochlorperazine (COMPAZINE) 10 MG tablet Take 1 tablet (10 mg total) by mouth every 6 (six) hours as needed (Nausea or vomiting). 30 tablet 1  . sucralfate (CARAFATE) 1 g tablet Take 1 tablet (1 g total) by mouth 2 (two) times daily. Dissolve in 2-3 tbsp warm water, swish and swallow 60 tablet 3  . ALPRAZolam (XANAX) 0.5 MG tablet Take 1 tablet (0.5 mg total) by mouth daily. Take 1 hour before radiation (Patient not taking: Reported on 12/06/2016) 10 tablet 0  . diphenhydrAMINE (BENADRYL) 25 mg capsule Take 25 mg by mouth every 6 (six) hours as needed.    . fexofenadine (ALLEGRA) 180 MG tablet Take 1 tablet by mouth 1 day or 1 dose.    Marland Kitchen Fluticasone-Salmeterol (ADVAIR DISKUS) 250-50 MCG/DOSE AEPB Inhale into the lungs.     No current facility-administered medications for this visit.    Facility-Administered Medications Ordered in Other Visits  Medication Dose Route Frequency Provider Last Rate Last Dose  . 0.9 %  sodium chloride infusion  250 mL Intravenous PRN Teodoro Spray, MD      . 0.9 %  sodium chloride infusion  250 mL Intravenous PRN Teodoro Spray, MD      . 0.9% sodium chloride infusion  1 mL/kg/hr Intravenous Continuous Teodoro Spray, MD      . 0.9% sodium chloride infusion  1 mL/kg/hr Intravenous Continuous Teodoro Spray, MD      . sodium chloride flush (NS) 0.9 % injection 3 mL  3 mL Intravenous Q12H Teodoro Spray, MD      . sodium chloride flush (NS) 0.9 % injection 3 mL  3 mL Intravenous PRN Teodoro Spray, MD      . sodium chloride flush (NS) 0.9 % injection 3 mL  3 mL Intravenous Q12H Teodoro Spray, MD      . sodium chloride flush (NS) 0.9 % injection 3 mL  3 mL Intravenous PRN Teodoro Spray, MD  OBJECTIVE: Vitals:   12/06/16 0856  BP: 132/75  Pulse: (!) 106  Resp: 18  Temp: (!) 96.2 F (35.7 C)     Body mass index is 22.56 kg/m.    ECOG FS:0 - Asymptomatic  GENERAL:  Well developed, well nourished, elderly sitting comfortably in the exam room in no acute distress. MENTAL STATUS:  Alert and oriented to person, place and time. HEAD:  Pearline Cables hair.  Normocephalic, atraumatic, face symmetric, no Cushingoid features. EYES:  Pupils equal round and reactive to light and accomodation.  No conjunctivitis or scleral icterus. ENT:  Eschar upper lip.  Oropharynx with palatal petechiae (improved) and left buccal mucosa hemorrhage (resolving).  Tongue normal. Mucous membranes moist.  RESPIRATORY:  Clear to auscultation without rales, wheezes or rhonchi. CARDIOVASCULAR:  Regular rate and rhythm without murmur, rub or gallop. ABDOMEN:  Soft, non-tender, with active bowel sounds, and no hepatosplenomegaly.  No masses. SKIN:  Tan.  No rashes, ulcers or lesions. EXTREMITIES: left forearm with small area of thrombophlebitis.  No edema, no skin discoloration or tenderness.  No palpable cords. LYMPH NODES: No palpable cervical, supraclavicular, axillary or inguinal adenopathy  NEUROLOGICAL: Unremarkable. PSYCH:  Appropriate.   LAB RESULTS:  Lab Results  Component Value Date   NA 135 12/05/2016   K 3.8 12/05/2016   CL  105 12/05/2016   CO2 24 12/05/2016   GLUCOSE 90 12/05/2016   BUN 16 12/05/2016   CREATININE 0.85 12/05/2016   CALCIUM 8.5 (L) 12/05/2016   PROT 6.9 12/05/2016   ALBUMIN 4.0 12/05/2016   AST 24 12/05/2016   ALT 12 (L) 12/05/2016   ALKPHOS 56 12/05/2016   BILITOT 0.6 12/05/2016   GFRNONAA >60 12/05/2016   GFRAA >60 12/05/2016    Lab Results  Component Value Date   WBC 6.2 12/06/2016   NEUTROABS 4.9 12/06/2016   HGB 9.0 (L) 12/06/2016   HCT 25.4 (L) 12/06/2016   MCV 93.8 12/06/2016   PLT 72 (L) 12/06/2016     STUDIES: No results found.  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  He is currently day 15 s/p cycle #3 carboplatin and etoposide.  Anemia has improved s/p transfusion.  He is left symptomatic.  Platelet count is adequate s/p transfusion yesterday.  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 Biopsy confirmed stage IV disease with bony metastasis. MRI the brain also reviewed independently revealing metastatic disease.  Cycle 3 of palliative chemotherapy with carboplatin and etoposide admin 03/0/2018 - 11/22/2016. Given his advanced age, etoposide was dose reduced. Patient receiving carboplatin and etoposide on day 1 and then etoposide only on days 2 and 3. He will also receive Onpro Neulasta support on day 3 of each cycle. He will also receive Zometa approximately every 4 weeks for his bony metastasis.  Patient has declined port placement at this time, but will reconsider in the future. He has expressed interest in continued quality of life over quantity of life.    1. Labs today: CBC with diff. 2. Anemia/Thrombocytopenia/Dehydration: Improved. Hgb 9.0, Hct 25.4, PLT 72.  No PRBC or PLT infusion today. 3. RTC on 12/09/2016 for labs (CBC with diff) and possible XRT.  RN to nofify Dr. Olena Leatherwood office of lab results. 4. RTC on 12/11/2016 for  labs (CBC with diff, CMP) for MD assessment and consideratoin of cycle #4 of carboplatin/etoposide.   Cancer Staging  Small cell lung cancer, left ([place]) Staging form: Lung, AJCC 7th Edition - Clinical stage from 09/25/2016: Stage IV (T2, N2, M1b) - Signed by Lloyd Huger, MD on 09/25/2016   Lucendia Herrlich, NP   12/06/2016 9:38 AM   I saw and evaluated the patient, participating in the key portions of the service and reviewing pertinent diagnostic studies and records.  I reviewed the nurse practitioner's note and agree with the findings and the plan.  The assessment and plan were discussed with the patient.  A few questions were asked by the patient and answered.    Lequita Asal, MD  12/06/2016

## 2016-12-09 ENCOUNTER — Ambulatory Visit: Payer: Medicare HMO

## 2016-12-09 ENCOUNTER — Other Ambulatory Visit: Payer: Self-pay | Admitting: *Deleted

## 2016-12-09 ENCOUNTER — Inpatient Hospital Stay: Payer: Medicare HMO

## 2016-12-09 DIAGNOSIS — Z5111 Encounter for antineoplastic chemotherapy: Secondary | ICD-10-CM | POA: Diagnosis not present

## 2016-12-09 DIAGNOSIS — D649 Anemia, unspecified: Secondary | ICD-10-CM

## 2016-12-09 DIAGNOSIS — D696 Thrombocytopenia, unspecified: Secondary | ICD-10-CM

## 2016-12-09 DIAGNOSIS — C3492 Malignant neoplasm of unspecified part of left bronchus or lung: Secondary | ICD-10-CM

## 2016-12-09 DIAGNOSIS — C7931 Secondary malignant neoplasm of brain: Secondary | ICD-10-CM

## 2016-12-09 LAB — CBC WITH DIFFERENTIAL/PLATELET
Basophils Absolute: 0 10*3/uL (ref 0–0.1)
Basophils Relative: 1 %
Eosinophils Absolute: 0.1 10*3/uL (ref 0–0.7)
Eosinophils Relative: 1 %
HCT: 25.4 % — ABNORMAL LOW (ref 40.0–52.0)
Hemoglobin: 8.9 g/dL — ABNORMAL LOW (ref 13.0–18.0)
Lymphocytes Relative: 8 %
Lymphs Abs: 0.6 10*3/uL — ABNORMAL LOW (ref 1.0–3.6)
MCH: 33.5 pg (ref 26.0–34.0)
MCHC: 34.9 g/dL (ref 32.0–36.0)
MCV: 96 fL (ref 80.0–100.0)
Monocytes Absolute: 0.7 10*3/uL (ref 0.2–1.0)
Monocytes Relative: 10 %
Neutro Abs: 5.4 10*3/uL (ref 1.4–6.5)
Neutrophils Relative %: 80 %
Platelets: 72 10*3/uL — ABNORMAL LOW (ref 150–440)
RBC: 2.64 MIL/uL — ABNORMAL LOW (ref 4.40–5.90)
RDW: 15.5 % — ABNORMAL HIGH (ref 11.5–14.5)
WBC: 6.7 10*3/uL (ref 3.8–10.6)

## 2016-12-09 LAB — COMPREHENSIVE METABOLIC PANEL
ALBUMIN: 3.8 g/dL (ref 3.5–5.0)
ALK PHOS: 53 U/L (ref 38–126)
ALT: 11 U/L — ABNORMAL LOW (ref 17–63)
ANION GAP: 4 — AB (ref 5–15)
AST: 19 U/L (ref 15–41)
BUN: 14 mg/dL (ref 6–20)
CO2: 27 mmol/L (ref 22–32)
Calcium: 9.1 mg/dL (ref 8.9–10.3)
Chloride: 105 mmol/L (ref 101–111)
Creatinine, Ser: 0.84 mg/dL (ref 0.61–1.24)
GFR calc Af Amer: >60 mL/min (ref >60)
GFR calc non Af Amer: >60 mL/min (ref >60)
GLUCOSE: 90 mg/dL (ref 65–99)
POTASSIUM: 4.2 mmol/L (ref 3.5–5.1)
SODIUM: 136 mmol/L (ref 135–145)
Total Bilirubin: 0.6 mg/dL (ref 0.3–1.2)
Total Protein: 7 g/dL (ref 6.5–8.1)

## 2016-12-09 LAB — SAMPLE TO BLOOD BANK

## 2016-12-10 ENCOUNTER — Ambulatory Visit: Payer: Medicare HMO

## 2016-12-10 NOTE — Progress Notes (Signed)
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  INTERVAL HISTORY: Patient returns to clinic today for further evaluation and consideration of cycle 4 of carboplatinum and etoposide. He feels significantly improved after receiving multiple units of packed red blood cells last week. His pain is better controlled. He has mild weakness and fatigue. He does not complain of dyspnea on exertion or shortness of breath. His constipation is better controlled as well. He has no neurologic complaints. He denies any recent fevers or illnesses. He has a good appetite and denies weight loss. He has no chest pain, hemoptysis, or cough. He has no nausea, vomiting, or diarrhea. He has no urinary complaints. Patient offers no further specific complaints today.  REVIEW OF SYSTEMS:   Review of Systems  Constitutional: Positive for malaise/fatigue. Negative for fever and weight loss.  Respiratory: Negative for cough, hemoptysis and shortness of breath.   Cardiovascular: Negative.  Negative for chest pain and leg swelling.  Gastrointestinal: Negative for abdominal pain and constipation.  Genitourinary: Negative.   Musculoskeletal: Positive for back pain.  Neurological: Positive for weakness.  Psychiatric/Behavioral: Negative.  The patient is not nervous/anxious.     As per HPI. Otherwise, a complete review of systems is negative.  PAST MEDICAL HISTORY: Past Medical History:  Diagnosis Date  . Anemia   . BPH (benign prostatic hyperplasia)   . Chronic idiopathic urticaria   . Diverticulosis   . Dupuytren's contracture of right hand   . History of hiatal hernia   . Hyperlipemia   . Lung cancer ([place])   . Shingles     PAST SURGICAL HISTORY: Past  Surgical History:  Procedure Laterality Date  . CARDIAC CATHETERIZATION Left 08/12/2016   Procedure: Left Heart Cath and Coronary Angiography;  Surgeon: Teodoro Spray, MD;  Location: [place] [facility];  Service: Cardiovascular;  Laterality: Left;  . COLONOSCOPY    . EUS N/A 02/22/2016   Procedure: UPPER ENDOSCOPIC ULTRASOUND (EUS) LINEAR;  Surgeon: Cora Daniels, MD;  Location: [facility]COPY;  Service: Endoscopy;  Laterality: N/A;  . TONSILLECTOMY      FAMILY HISTORY: Family History  Problem Relation Age of Onset  . Cancer Mother     unknown type  . Lung cancer Father   . Colon cancer Father     ADVANCED DIRECTIVES (Y/N):  N  HEALTH MAINTENANCE: Social History  Substance Use Topics  . Smoking status: Former Smoker    Years: 30.00    Types: Cigarettes    Quit date: 08/12/1981  . Smokeless tobacco: Never Used  . Alcohol use No     Colonoscopy:  PAP:  Bone density:  Lipid panel:  No Known Allergies  Current Outpatient Prescriptions  Medication Sig Dispense Refill  . aspirin EC 81 MG tablet Take 1 tablet by mouth 1 day or 1 dose.    . Cyanocobalamin (RA VITAMIN B-12 TR) 1000 MCG TBCR Take 1 tablet by mouth 1 day or 1 dose.    . diphenhydrAMINE (BENADRYL) 25 mg capsule Take 25 mg by mouth every 6 (six) hours as needed.    . docusate sodium (COLACE) 100 MG capsule Take 1 capsule (100 mg total) by mouth 2 (two) times daily. 60 capsule 2  . linaclotide (LINZESS) 145 MCG CAPS  capsule Take 145 mcg by mouth.    . loratadine-pseudoephedrine (CLARITIN-D 24-HOUR) 10-240 MG 24 hr tablet Take 1 tablet by mouth daily.    . megestrol (MEGACE) 40 MG tablet Take 1 tablet (40 mg total) by mouth daily. 30 tablet 1  . Multiple Vitamin (MULTIVITAMIN) capsule Take 1 capsule by mouth daily.    . ondansetron (ZOFRAN) 8 MG tablet Take 1 tablet (8 mg total) by mouth 2 (two) times daily as needed for refractory nausea / vomiting. 30 tablet 1  . Oxycodone HCl 10 MG TABS Take 1-2 tablets  (10-20 mg total) by mouth every 4 (four) hours as needed. 60 tablet 0  . Probiotic Product (PROBIOTIC DAILY) CAPS Take 1 capsule by mouth daily.    . prochlorperazine (COMPAZINE) 10 MG tablet Take 1 tablet (10 mg total) by mouth every 6 (six) hours as needed (Nausea or vomiting). 30 tablet 1  . ALPRAZolam (XANAX) 0.5 MG tablet Take 1 tablet (0.5 mg total) by mouth daily. Take 1 hour before radiation (Patient not taking: Reported on 12/06/2016) 10 tablet 0  . Fluticasone-Salmeterol (ADVAIR DISKUS) 250-50 MCG/DOSE AEPB Inhale into the lungs.    . sucralfate (CARAFATE) 1 g tablet Take 1 tablet (1 g total) by mouth 2 (two) times daily. Dissolve in 2-3 tbsp warm water, swish and swallow (Patient not taking: Reported on 12/11/2016) 60 tablet 3   No current facility-administered medications for this visit.    Facility-Administered Medications Ordered in Other Visits  Medication Dose Route Frequency Provider Last Rate Last Dose  . 0.9 %  sodium chloride infusion  250 mL Intravenous PRN Teodoro Spray, MD      . 0.9 %  sodium chloride infusion  250 mL Intravenous PRN Teodoro Spray, MD      . 0.9% sodium chloride infusion  1 mL/kg/hr Intravenous Continuous Teodoro Spray, MD      . 0.9% sodium chloride infusion  1 mL/kg/hr Intravenous Continuous Teodoro Spray, MD      . sodium chloride flush (NS) 0.9 % injection 3 mL  3 mL Intravenous Q12H Teodoro Spray, MD      . sodium chloride flush (NS) 0.9 % injection 3 mL  3 mL Intravenous PRN Teodoro Spray, MD      . sodium chloride flush (NS) 0.9 % injection 3 mL  3 mL Intravenous Q12H Teodoro Spray, MD      . sodium chloride flush (NS) 0.9 % injection 3 mL  3 mL Intravenous PRN Teodoro Spray, MD        OBJECTIVE: Vitals:   12/11/16 0842  BP: 124/78  Pulse: (!) 108  Resp: 18  Temp: 98.3 F (36.8 C)     Body mass index is 22.08 kg/m.    ECOG FS:0 - Asymptomatic  General: Well-developed, well-nourished, no acute distress. Eyes: Pink conjunctiva,  anicteric sclera. Lungs: Clear to auscultation bilaterally. Heart: Regular rate and rhythm. No rubs, murmurs, or gallops. Abdomen: Soft, nontender, nondistended. No organomegaly noted, normoactive bowel sounds. Musculoskeletal: No edema, cyanosis, or clubbing. Neuro: Alert, answering all questions appropriately. Cranial nerves grossly intact. Skin: No rashes or petechiae noted. Psych: Normal affect.   LAB RESULTS:  Lab Results  Component Value Date   NA 136 12/09/2016   K 4.2 12/09/2016   CL 105 12/09/2016   CO2 27 12/09/2016   GLUCOSE 90 12/09/2016   BUN 14 12/09/2016   CREATININE 0.84 12/09/2016   CALCIUM 9.1 12/09/2016   PROT 7.0  12/09/2016   ALBUMIN 3.8 12/09/2016   AST 19 12/09/2016   ALT 11 (L) 12/09/2016   ALKPHOS 53 12/09/2016   BILITOT 0.6 12/09/2016   GFRNONAA >60 12/09/2016   GFRAA >60 12/09/2016    Lab Results  Component Value Date   WBC 6.7 12/09/2016   NEUTROABS 5.4 12/09/2016   HGB 8.9 (L) 12/09/2016   HCT 25.4 (L) 12/09/2016   MCV 96.0 12/09/2016   PLT 72 (L) 12/09/2016     STUDIES: No results found.  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  PLAN:    1. 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 MRI the brain also reviewed independently revealing metastatic disease. Delay cycle 4 of palliative chemotherapy with carboplatinum and etoposide today secondary to thrombocytopenia. Given his advanced age, etoposide was dose reduced. Patient will receive carboplatinum and etoposide on day 1 and then etoposide only on days 2 and 3. He will also receive Onpro Neulasta support on day 3 of each cycle. He will also receive Zometa approximately every 4 weeks for his bony metastasis.  Patient has declined port placement at this time, but will reconsider in the future. He has expressed interest in continued quality of life over quantity of life.   Return to clinic in 1 week for reconsideration of cycle 4. Will reimage after cycle 4.  2. Hypertension: Patient's blood pressure is within normal limits today, monitor. 3. Pain: Continue fentanyl patch to 50 g every 72 hours and 1-2 tabs of oxycodone as needed. 4. Brain metastasis: Treatment per radiation oncology. Patient has completed palliative XRT. 5. Shortness of breath: Improved. Patient no longer requires home oxygen. 6. Constipation: Patient has been instructed to use stool softeners daily and magnesium citrate as needed. 7. Back pain: MRI of the thoracic spine on November 02, 2016 revealed likely pathologic compression fracture. Continue palliative XRT.  8. Anemia: Likely contributing to patient's weakness and fatigue. Improved. Patient had blood transfusion last week. 9. Thrombocytopenia: Secondary to chemotherapy. Delay treatment as above.  Patient expressed understanding and was in agreement with this plan. He also understands that He can call clinic at any time with any questions, concerns, or complaints.   Cancer Staging Small cell lung cancer, left [facility]) Staging form: Lung, AJCC 7th Edition - Clinical stage from 09/25/2016: Stage IV (T2, N2, M1b) - Signed by Lloyd Huger, MD on 09/25/2016   Lloyd Huger, MD   12/11/2016 10:42 AM

## 2016-12-11 ENCOUNTER — Inpatient Hospital Stay (HOSPITAL_BASED_OUTPATIENT_CLINIC_OR_DEPARTMENT_OTHER): Payer: Medicare HMO | Admitting: Oncology

## 2016-12-11 ENCOUNTER — Inpatient Hospital Stay: Payer: Medicare HMO

## 2016-12-11 ENCOUNTER — Ambulatory Visit: Payer: Medicare HMO

## 2016-12-11 ENCOUNTER — Encounter: Payer: Self-pay | Admitting: Oncology

## 2016-12-11 VITALS — BP 124/78 | HR 108 | Temp 98.3°F | Resp 18 | Ht 72.0 in | Wt 162.8 lb

## 2016-12-11 DIAGNOSIS — C7931 Secondary malignant neoplasm of brain: Secondary | ICD-10-CM | POA: Diagnosis not present

## 2016-12-11 DIAGNOSIS — D6959 Other secondary thrombocytopenia: Secondary | ICD-10-CM | POA: Diagnosis not present

## 2016-12-11 DIAGNOSIS — R0602 Shortness of breath: Secondary | ICD-10-CM | POA: Diagnosis not present

## 2016-12-11 DIAGNOSIS — Z5111 Encounter for antineoplastic chemotherapy: Secondary | ICD-10-CM | POA: Diagnosis not present

## 2016-12-11 DIAGNOSIS — C7951 Secondary malignant neoplasm of bone: Secondary | ICD-10-CM | POA: Diagnosis not present

## 2016-12-11 DIAGNOSIS — C3492 Malignant neoplasm of unspecified part of left bronchus or lung: Secondary | ICD-10-CM | POA: Diagnosis not present

## 2016-12-11 DIAGNOSIS — Z79899 Other long term (current) drug therapy: Secondary | ICD-10-CM

## 2016-12-11 DIAGNOSIS — K5900 Constipation, unspecified: Secondary | ICD-10-CM

## 2016-12-11 DIAGNOSIS — D649 Anemia, unspecified: Secondary | ICD-10-CM

## 2016-12-11 NOTE — Progress Notes (Signed)
Pt weak today, has red hard place that is itchy on left forearm. Congested in throat today.

## 2016-12-12 ENCOUNTER — Inpatient Hospital Stay: Payer: Medicare HMO

## 2016-12-12 ENCOUNTER — Ambulatory Visit: Payer: Medicare HMO

## 2016-12-13 ENCOUNTER — Ambulatory Visit: Payer: Medicare HMO

## 2016-12-13 ENCOUNTER — Inpatient Hospital Stay: Payer: Medicare HMO

## 2016-12-14 ENCOUNTER — Encounter: Payer: Self-pay | Admitting: Hematology and Oncology

## 2016-12-16 ENCOUNTER — Ambulatory Visit: Payer: Medicare HMO

## 2016-12-16 ENCOUNTER — Inpatient Hospital Stay: Payer: Medicare HMO | Attending: Radiation Oncology

## 2016-12-16 DIAGNOSIS — K449 Diaphragmatic hernia without obstruction or gangrene: Secondary | ICD-10-CM | POA: Insufficient documentation

## 2016-12-16 DIAGNOSIS — Z5111 Encounter for antineoplastic chemotherapy: Secondary | ICD-10-CM | POA: Insufficient documentation

## 2016-12-16 DIAGNOSIS — C3492 Malignant neoplasm of unspecified part of left bronchus or lung: Secondary | ICD-10-CM | POA: Insufficient documentation

## 2016-12-16 DIAGNOSIS — Z7689 Persons encountering health services in other specified circumstances: Secondary | ICD-10-CM | POA: Insufficient documentation

## 2016-12-16 DIAGNOSIS — C7931 Secondary malignant neoplasm of brain: Secondary | ICD-10-CM

## 2016-12-16 DIAGNOSIS — D696 Thrombocytopenia, unspecified: Secondary | ICD-10-CM | POA: Insufficient documentation

## 2016-12-16 DIAGNOSIS — N400 Enlarged prostate without lower urinary tract symptoms: Secondary | ICD-10-CM | POA: Insufficient documentation

## 2016-12-16 DIAGNOSIS — Z7982 Long term (current) use of aspirin: Secondary | ICD-10-CM | POA: Insufficient documentation

## 2016-12-16 DIAGNOSIS — Z87891 Personal history of nicotine dependence: Secondary | ICD-10-CM | POA: Insufficient documentation

## 2016-12-16 DIAGNOSIS — E785 Hyperlipidemia, unspecified: Secondary | ICD-10-CM | POA: Insufficient documentation

## 2016-12-16 DIAGNOSIS — K5900 Constipation, unspecified: Secondary | ICD-10-CM | POA: Insufficient documentation

## 2016-12-16 DIAGNOSIS — C7951 Secondary malignant neoplasm of bone: Secondary | ICD-10-CM | POA: Insufficient documentation

## 2016-12-16 DIAGNOSIS — D649 Anemia, unspecified: Secondary | ICD-10-CM | POA: Insufficient documentation

## 2016-12-16 DIAGNOSIS — G893 Neoplasm related pain (acute) (chronic): Secondary | ICD-10-CM | POA: Insufficient documentation

## 2016-12-16 DIAGNOSIS — Z79899 Other long term (current) drug therapy: Secondary | ICD-10-CM | POA: Insufficient documentation

## 2016-12-17 ENCOUNTER — Ambulatory Visit: Payer: Medicare HMO

## 2016-12-17 NOTE — Progress Notes (Signed)
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  INTERVAL HISTORY: Patient returns to clinic today for further evaluation and reconsideration of cycle 4 of carboplatinum and etoposide. He currently feels well and is nearly back to his baseline. His pain is better controlled. He has mild weakness and fatigue. He does not complain of dyspnea on exertion or shortness of breath. His constipation is better controlled as well. He has no neurologic complaints. He denies any recent fevers or illnesses. He has a fair appetite and denies weight loss. He has no chest pain, hemoptysis, or cough. He has no nausea, vomiting, or diarrhea. He has no urinary complaints. Patient offers no further specific complaints today.  REVIEW OF SYSTEMS:   Review of Systems  Constitutional: Positive for malaise/fatigue. Negative for fever and weight loss.  Respiratory: Negative for cough, hemoptysis and shortness of breath.   Cardiovascular: Negative.  Negative for chest pain and leg swelling.  Gastrointestinal: Negative for abdominal pain and constipation.  Genitourinary: Negative.   Musculoskeletal: Positive for back pain.  Neurological: Positive for weakness.  Psychiatric/Behavioral: Negative.  The patient is not nervous/anxious.     As per HPI. Otherwise, a complete review of systems is negative.  PAST MEDICAL HISTORY: Past Medical History:  Diagnosis Date  . Anemia   . BPH (benign prostatic hyperplasia)   . Chronic idiopathic urticaria   . Diverticulosis   . Dupuytren's contracture of right hand   . History of hiatal hernia   . Hyperlipemia   . Lung cancer ([place])   . Shingles     PAST SURGICAL HISTORY: Past Surgical History:  Procedure Laterality  Date  . CARDIAC CATHETERIZATION Left 08/12/2016   Procedure: Left Heart Cath and Coronary Angiography;  Surgeon: Teodoro Spray, MD;  Location: [place] [facility];  Service: Cardiovascular;  Laterality: Left;  . COLONOSCOPY    . EUS N/A 02/22/2016   Procedure: UPPER ENDOSCOPIC ULTRASOUND (EUS) LINEAR;  Surgeon: Cora Daniels, MD;  Location: [facility] ENDOSCOPY;  Service: Endoscopy;  Laterality: N/A;  . TONSILLECTOMY      FAMILY HISTORY: Family History  Problem Relation Age of Onset  . Cancer Mother     unknown type  . Lung cancer Father   . Colon cancer Father     ADVANCED DIRECTIVES (Y/N):  N  HEALTH MAINTENANCE: Social History  Substance Use Topics  . Smoking status: Former Smoker    Years: 30.00    Types: Cigarettes    Quit date: 08/12/1981  . Smokeless tobacco: Never Used  . Alcohol use No     Colonoscopy:  PAP:  Bone density:  Lipid panel:  No Known Allergies  Current Outpatient Prescriptions  Medication Sig Dispense Refill  . ALPRAZolam (XANAX) 0.5 MG tablet Take 1 tablet (0.5 mg total) by mouth daily. Take 1 hour before radiation 10 tablet 0  . aspirin EC 81 MG tablet Take 1 tablet by mouth 1 day or 1 dose.    . Cyanocobalamin (RA VITAMIN B-12 TR) 1000 MCG TBCR Take 1 tablet by mouth 1 day or 1 dose.    . diphenhydrAMINE (BENADRYL) 25 mg capsule Take 25 mg by mouth every 6 (six) hours as needed.    . docusate sodium (COLACE) 100 MG capsule Take 1  capsule (100 mg total) by mouth 2 (two) times daily. 60 capsule 2  . Fluticasone-Salmeterol (ADVAIR DISKUS) 250-50 MCG/DOSE AEPB Inhale into the lungs.    Marland Kitchen linaclotide (LINZESS) 145 MCG CAPS capsule Take 145 mcg by mouth.    . loratadine-pseudoephedrine (CLARITIN-D 24-HOUR) 10-240 MG 24 hr tablet Take 1 tablet by mouth daily.    . megestrol (MEGACE) 40 MG tablet Take 1 tablet (40 mg total) by mouth daily. 30 tablet 1  . Multiple Vitamin (MULTIVITAMIN) capsule Take 1 capsule by mouth daily.    . ondansetron  (ZOFRAN) 8 MG tablet Take 1 tablet (8 mg total) by mouth 2 (two) times daily as needed for refractory nausea / vomiting. 30 tablet 1  . Oxycodone HCl 10 MG TABS Take 1-2 tablets (10-20 mg total) by mouth every 4 (four) hours as needed. 60 tablet 0  . Probiotic Product (PROBIOTIC DAILY) CAPS Take 1 capsule by mouth daily.    . prochlorperazine (COMPAZINE) 10 MG tablet Take 1 tablet (10 mg total) by mouth every 6 (six) hours as needed (Nausea or vomiting). 30 tablet 1  . sucralfate (CARAFATE) 1 g tablet Take 1 tablet (1 g total) by mouth 2 (two) times daily. Dissolve in 2-3 tbsp warm water, swish and swallow 60 tablet 3   No current facility-administered medications for this visit.    Facility-Administered Medications Ordered in Other Visits  Medication Dose Route Frequency Provider Last Rate Last Dose  . 0.9 %  sodium chloride infusion  250 mL Intravenous PRN Teodoro Spray, MD      . 0.9 %  sodium chloride infusion  250 mL Intravenous PRN Teodoro Spray, MD      . 0.9% sodium chloride infusion  1 mL/kg/hr Intravenous Continuous Teodoro Spray, MD      . 0.9% sodium chloride infusion  1 mL/kg/hr Intravenous Continuous Teodoro Spray, MD      . sodium chloride flush (NS) 0.9 % injection 3 mL  3 mL Intravenous Q12H Teodoro Spray, MD      . sodium chloride flush (NS) 0.9 % injection 3 mL  3 mL Intravenous PRN Teodoro Spray, MD      . sodium chloride flush (NS) 0.9 % injection 3 mL  3 mL Intravenous Q12H Teodoro Spray, MD      . sodium chloride flush (NS) 0.9 % injection 3 mL  3 mL Intravenous PRN Teodoro Spray, MD        OBJECTIVE: Vitals:   12/18/16 0946  BP: 135/74  Pulse: (!) 116  Resp: 18  Temp: 97.9 F (36.6 C)     Body mass index is 21.6 kg/m.    ECOG FS:0 - Asymptomatic  General: Well-developed, well-nourished, no acute distress. Eyes: Pink conjunctiva, anicteric sclera. Lungs: Clear to auscultation bilaterally. Heart: Regular rate and rhythm. No rubs, murmurs, or  gallops. Abdomen: Soft, nontender, nondistended. No organomegaly noted, normoactive bowel sounds. Musculoskeletal: No edema, cyanosis, or clubbing. Neuro: Alert, answering all questions appropriately. Cranial nerves grossly intact. Skin: No rashes or petechiae noted. Psych: Normal affect.   LAB RESULTS:  Lab Results  Component Value Date   NA 135 12/18/2016   K 3.9 12/18/2016   CL 104 12/18/2016   CO2 23 12/18/2016   GLUCOSE 159 (H) 12/18/2016   BUN 14 12/18/2016   CREATININE 1.03 12/18/2016   CALCIUM 8.7 (L) 12/18/2016   PROT 7.3 12/18/2016   ALBUMIN 3.9 12/18/2016   AST 26 12/18/2016   ALT  11 (L) 12/18/2016   ALKPHOS 42 12/18/2016   BILITOT 0.6 12/18/2016   GFRNONAA >60 12/18/2016   GFRAA >60 12/18/2016    Lab Results  Component Value Date   WBC 6.2 12/18/2016   NEUTROABS 4.9 12/18/2016   HGB 8.9 (L) 12/18/2016   HCT 25.6 (L) 12/18/2016   MCV 100.2 (H) 12/18/2016   PLT 260 12/18/2016     STUDIES: No results found.  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  PLAN:    1. 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 MRI the brain also reviewed independently revealing metastatic disease. Proceed with cycle 4 of palliative chemotherapy with carboplatinum and etoposide today. Given his advanced age, etoposide was dose reduced. Patient will receive carboplatinum and etoposide on day 1 and then etoposide only on days 2 and 3. He will also receive Onpro Neulasta support on day 3 of each cycle. He will also receive Zometa approximately every 4 weeks for his bony metastasis.  Patient has declined port placement at this time, but will reconsider in the future. He has expressed interest in continued quality of life over quantity of life.  Return to clinic in 4 weeks with repeat imaging using PET scan and further evaluation. 2. Hypertension: Patient's blood pressure is  within normal limits today, monitor. 3. Pain: Continue fentanyl patch to 50 g every 72 hours and 1-2 tabs of oxycodone as needed. 4. Brain metastasis: Treatment per radiation oncology. Patient has completed palliative XRT. 5. Shortness of breath: Improved. Patient no longer requires home oxygen. 6. Constipation: Patient has been instructed to use stool softeners daily and magnesium citrate as needed. 7. Back pain: MRI of the thoracic spine on November 02, 2016 revealed likely pathologic compression fracture. He has completed palliative XRT. 8. Anemia: Likely contributing to patient's weakness and fatigue. Improved with recent transfusion. 9. Thrombocytopenia: Improved, proceed with treatment as above.  Patient expressed understanding and was in agreement with this plan. He also understands that He can call clinic at any time with any questions, concerns, or complaints.   Cancer Staging Small cell lung cancer, left ([place]) Staging form: Lung, AJCC 7th Edition - Clinical stage from 09/25/2016: Stage IV (T2, N2, M1b) - Signed by Lloyd Huger, MD on 09/25/2016   Lloyd Huger, MD   12/18/2016 10:01 AM

## 2016-12-18 ENCOUNTER — Ambulatory Visit: Payer: Medicare HMO

## 2016-12-18 ENCOUNTER — Inpatient Hospital Stay (HOSPITAL_BASED_OUTPATIENT_CLINIC_OR_DEPARTMENT_OTHER): Payer: Medicare HMO | Admitting: Oncology

## 2016-12-18 ENCOUNTER — Inpatient Hospital Stay: Payer: Medicare HMO

## 2016-12-18 ENCOUNTER — Ambulatory Visit
Admission: RE | Admit: 2016-12-18 | Discharge: 2016-12-18 | Disposition: A | Discharge status: Home / Self Care | Payer: Medicare HMO | Source: Ambulatory Visit | Attending: Radiation Oncology | Admitting: Radiation Oncology

## 2016-12-18 VITALS — BP 135/74 | HR 116 | Temp 97.9°F | Resp 18 | Wt 159.3 lb

## 2016-12-18 DIAGNOSIS — K5900 Constipation, unspecified: Secondary | ICD-10-CM | POA: Diagnosis not present

## 2016-12-18 DIAGNOSIS — C3492 Malignant neoplasm of unspecified part of left bronchus or lung: Secondary | ICD-10-CM

## 2016-12-18 DIAGNOSIS — E785 Hyperlipidemia, unspecified: Secondary | ICD-10-CM | POA: Diagnosis not present

## 2016-12-18 DIAGNOSIS — Z5111 Encounter for antineoplastic chemotherapy: Secondary | ICD-10-CM | POA: Diagnosis present

## 2016-12-18 DIAGNOSIS — Z7982 Long term (current) use of aspirin: Secondary | ICD-10-CM | POA: Diagnosis not present

## 2016-12-18 DIAGNOSIS — G893 Neoplasm related pain (acute) (chronic): Secondary | ICD-10-CM | POA: Diagnosis not present

## 2016-12-18 DIAGNOSIS — C7951 Secondary malignant neoplasm of bone: Secondary | ICD-10-CM

## 2016-12-18 DIAGNOSIS — Z87891 Personal history of nicotine dependence: Secondary | ICD-10-CM | POA: Diagnosis not present

## 2016-12-18 DIAGNOSIS — D696 Thrombocytopenia, unspecified: Secondary | ICD-10-CM | POA: Diagnosis not present

## 2016-12-18 DIAGNOSIS — Z79899 Other long term (current) drug therapy: Secondary | ICD-10-CM | POA: Diagnosis not present

## 2016-12-18 DIAGNOSIS — C7931 Secondary malignant neoplasm of brain: Secondary | ICD-10-CM

## 2016-12-18 DIAGNOSIS — D649 Anemia, unspecified: Secondary | ICD-10-CM | POA: Diagnosis not present

## 2016-12-18 DIAGNOSIS — Z510 Encounter for antineoplastic radiation therapy: Secondary | ICD-10-CM | POA: Diagnosis not present

## 2016-12-18 DIAGNOSIS — N400 Enlarged prostate without lower urinary tract symptoms: Secondary | ICD-10-CM | POA: Diagnosis not present

## 2016-12-18 DIAGNOSIS — K449 Diaphragmatic hernia without obstruction or gangrene: Secondary | ICD-10-CM | POA: Diagnosis not present

## 2016-12-18 DIAGNOSIS — Z7689 Persons encountering health services in other specified circumstances: Secondary | ICD-10-CM | POA: Diagnosis not present

## 2016-12-18 LAB — COMPREHENSIVE METABOLIC PANEL
ALT: 11 U/L — AB (ref 17–63)
AST: 26 U/L (ref 15–41)
Albumin: 3.9 g/dL (ref 3.5–5.0)
Alkaline Phosphatase: 42 U/L (ref 38–126)
Anion gap: 8 (ref 5–15)
BUN: 14 mg/dL (ref 6–20)
CHLORIDE: 104 mmol/L (ref 101–111)
CO2: 23 mmol/L (ref 22–32)
Calcium: 8.7 mg/dL — ABNORMAL LOW (ref 8.9–10.3)
Creatinine, Ser: 1.03 mg/dL (ref 0.61–1.24)
Glucose, Bld: 159 mg/dL — ABNORMAL HIGH (ref 65–99)
POTASSIUM: 3.9 mmol/L (ref 3.5–5.1)
SODIUM: 135 mmol/L (ref 135–145)
Total Bilirubin: 0.6 mg/dL (ref 0.3–1.2)
Total Protein: 7.3 g/dL (ref 6.5–8.1)

## 2016-12-18 LAB — CBC WITH DIFFERENTIAL/PLATELET
BASOS ABS: 0 10*3/uL (ref 0–0.1)
Basophils Relative: 0 %
EOS ABS: 0 10*3/uL (ref 0–0.7)
EOS PCT: 1 %
HCT: 25.6 % — ABNORMAL LOW (ref 40.0–52.0)
Hemoglobin: 8.9 g/dL — ABNORMAL LOW (ref 13.0–18.0)
Lymphocytes Relative: 10 %
Lymphs Abs: 0.6 10*3/uL — ABNORMAL LOW (ref 1.0–3.6)
MCH: 34.9 pg — AB (ref 26.0–34.0)
MCHC: 34.8 g/dL (ref 32.0–36.0)
MCV: 100.2 fL — AB (ref 80.0–100.0)
MONO ABS: 0.7 10*3/uL (ref 0.2–1.0)
Monocytes Relative: 11 %
Neutro Abs: 4.9 10*3/uL (ref 1.4–6.5)
Neutrophils Relative %: 78 %
PLATELETS: 260 10*3/uL (ref 150–440)
RBC: 2.55 MIL/uL — AB (ref 4.40–5.90)
RDW: 22 % — AB (ref 11.5–14.5)
WBC: 6.2 10*3/uL (ref 3.8–10.6)

## 2016-12-18 MED ORDER — SODIUM CHLORIDE 0.9 % IV SOLN
80.0000 mg/m2 | Freq: Once | INTRAVENOUS | Status: AC
  Administered 2016-12-18: 160 mg via INTRAVENOUS
  Filled 2016-12-18: qty 8

## 2016-12-18 MED ORDER — PALONOSETRON HCL INJECTION 0.25 MG/5ML
0.2500 mg | Freq: Once | INTRAVENOUS | Status: AC
  Administered 2016-12-18: 0.25 mg via INTRAVENOUS
  Filled 2016-12-18: qty 5

## 2016-12-18 MED ORDER — ZOLEDRONIC ACID 4 MG/5ML IV CONC
3.3000 mg | Freq: Once | INTRAVENOUS | Status: AC
  Administered 2016-12-18: 3.3 mg via INTRAVENOUS
  Filled 2016-12-18: qty 4.13

## 2016-12-18 MED ORDER — SODIUM CHLORIDE 0.9 % IV SOLN
Freq: Once | INTRAVENOUS | Status: AC
  Administered 2016-12-18: 11:00:00 via INTRAVENOUS
  Filled 2016-12-18: qty 1000

## 2016-12-18 MED ORDER — DEXAMETHASONE SODIUM PHOSPHATE 10 MG/ML IJ SOLN
10.0000 mg | Freq: Once | INTRAMUSCULAR | Status: AC
Start: 2016-12-18 — End: 2016-12-18
  Administered 2016-12-18: 10 mg via INTRAVENOUS
  Filled 2016-12-18: qty 1

## 2016-12-18 MED ORDER — SODIUM CHLORIDE 0.9 % IV SOLN: 427.0000 mg | Freq: Once | INTRAVENOUS | Status: DC

## 2016-12-18 MED ORDER — OXYCODONE HCL 10 MG PO TABS: 10.0000 mg | ORAL_TABLET | ORAL | 0 refills | Status: DC | PRN

## 2016-12-18 MED ORDER — SODIUM CHLORIDE 0.9 % IV SOLN
390.0000 mg | Freq: Once | INTRAVENOUS | Status: AC
  Administered 2016-12-18: 390 mg via INTRAVENOUS
  Filled 2016-12-18: qty 39

## 2016-12-18 NOTE — Progress Notes (Signed)
States is feeling better today but continues to have decreased appetite.

## 2016-12-19 ENCOUNTER — Inpatient Hospital Stay: Payer: Medicare HMO

## 2016-12-19 ENCOUNTER — Ambulatory Visit: Payer: Medicare HMO

## 2016-12-19 ENCOUNTER — Ambulatory Visit
Admission: RE | Admit: 2016-12-19 | Discharge: 2016-12-19 | Disposition: A | Discharge status: Home / Self Care | Payer: Medicare HMO | Source: Ambulatory Visit | Attending: Radiation Oncology | Admitting: Radiation Oncology

## 2016-12-19 ENCOUNTER — Other Ambulatory Visit: Payer: Self-pay | Admitting: Oncology

## 2016-12-19 VITALS — BP 153/85 | HR 82 | Temp 97.9°F | Resp 18

## 2016-12-19 DIAGNOSIS — Z510 Encounter for antineoplastic radiation therapy: Secondary | ICD-10-CM | POA: Diagnosis not present

## 2016-12-19 DIAGNOSIS — Z5111 Encounter for antineoplastic chemotherapy: Secondary | ICD-10-CM | POA: Diagnosis not present

## 2016-12-19 DIAGNOSIS — C3492 Malignant neoplasm of unspecified part of left bronchus or lung: Secondary | ICD-10-CM

## 2016-12-19 MED ORDER — SODIUM CHLORIDE 0.9 % IV SOLN
80.0000 mg/m2 | Freq: Once | INTRAVENOUS | Status: AC
  Administered 2016-12-19: 160 mg via INTRAVENOUS
  Filled 2016-12-19: qty 8

## 2016-12-19 MED ORDER — DEXAMETHASONE SODIUM PHOSPHATE 10 MG/ML IJ SOLN
10.0000 mg | Freq: Once | INTRAMUSCULAR | Status: AC
  Administered 2016-12-19: 10 mg via INTRAVENOUS
  Filled 2016-12-19: qty 1

## 2016-12-19 MED ORDER — SODIUM CHLORIDE 0.9 % IV SOLN
Freq: Once | INTRAVENOUS | Status: AC
  Administered 2016-12-19: 15:00:00 via INTRAVENOUS
  Filled 2016-12-19: qty 1000

## 2016-12-19 NOTE — Progress Notes (Signed)
Nutrition Assessment   Reason for Assessment:   Patient identified on Malnutrition Screening Report for poor appetite and weight loss  ASSESSMENT:  83 year old male with stage IV lung cancer with bony mets.  Patient receiving chemotherapy, has completed palliative radiation.   Past medical history of anemia, diverticulosis, hiatal hernia, HLD. Noted recent blood transfusion.  Patient reports decrease in appetite over the last 2 months with some slight increase in appetite recently.  Does report taste changes as well.  No nausea, some constipation but better with medication, no mouth soreness.   Patient reports had ham and eggs for dinner last night and ate all of it.  Reports that he has been trying to drink ensure original daily for added calories.  "I have been trying to find foods with more calories."  Nutrition Focused Physical Exam: deferred  Medications: vit B 12, colace, megace, MVI, zofran, compazine, carafate  Labs: reviewed  Anthropometrics:   Height: 72 inches Weight: 159 lb 4.8 oz Patient reports 10 pound weight loss in the last 2 months 6% weight loss in 2 months BMI: 21   Estimated Energy Needs  Kcals: 2100-2500 calories/d Protein: 86-108 g/d Fluid: 2.5 L/d  NUTRITION DIAGNOSIS: Inadequate oral intake related to lack of taste, poor appetite as evidenced by 6% weight loss in the last 2 months and decreased intake   MALNUTRITION DIAGNOSIS: none at this time   INTERVENTION:   Discussed strategies to increase calories and protein.  Fact sheet given.   Patient verbalized understanding. Discussed oral nutrition supplements and encouraged supplement of 350 calories or more.  Samples and coupons given.     MONITORING, EVALUATION, GOAL: Patient will consume adequate calories and protein to meet nutritional needs   NEXT VISIT: May 3rd  Tommaso Cavitt B. Zenia Resides, [place], [place] Registered Dietitian [phone] (pager)

## 2016-12-20 ENCOUNTER — Ambulatory Visit
Admission: RE | Admit: 2016-12-20 | Discharge: 2016-12-20 | Disposition: A | Discharge status: Home / Self Care | Payer: Medicare HMO | Source: Ambulatory Visit | Attending: Radiation Oncology | Admitting: Radiation Oncology

## 2016-12-20 ENCOUNTER — Encounter: Payer: Self-pay | Admitting: *Deleted

## 2016-12-20 ENCOUNTER — Ambulatory Visit: Payer: Medicare HMO

## 2016-12-20 ENCOUNTER — Inpatient Hospital Stay: Payer: Medicare HMO

## 2016-12-20 VITALS — BP 154/81 | HR 92 | Temp 97.9°F | Resp 18

## 2016-12-20 DIAGNOSIS — C3492 Malignant neoplasm of unspecified part of left bronchus or lung: Secondary | ICD-10-CM

## 2016-12-20 DIAGNOSIS — Z510 Encounter for antineoplastic radiation therapy: Secondary | ICD-10-CM | POA: Diagnosis not present

## 2016-12-20 DIAGNOSIS — Z5111 Encounter for antineoplastic chemotherapy: Secondary | ICD-10-CM | POA: Diagnosis not present

## 2016-12-20 MED ORDER — DEXAMETHASONE SODIUM PHOSPHATE 10 MG/ML IJ SOLN
10.0000 mg | Freq: Once | INTRAMUSCULAR | Status: AC
  Administered 2016-12-20: 10 mg via INTRAVENOUS
  Filled 2016-12-20: qty 1

## 2016-12-20 MED ORDER — PEGFILGRASTIM 6 MG/0.6ML ~~LOC~~ PSKT
6.0000 mg | PREFILLED_SYRINGE | Freq: Once | SUBCUTANEOUS | Status: AC
  Administered 2016-12-20: 6 mg via SUBCUTANEOUS
  Filled 2016-12-20: qty 0.6

## 2016-12-20 MED ORDER — SODIUM CHLORIDE 0.9 % IV SOLN
80.0000 mg/m2 | Freq: Once | INTRAVENOUS | Status: AC
  Administered 2016-12-20: 160 mg via INTRAVENOUS
  Filled 2016-12-20: qty 8

## 2016-12-20 MED ORDER — SODIUM CHLORIDE 0.9 % IV SOLN
Freq: Once | INTRAVENOUS | Status: AC
  Administered 2016-12-20: 14:00:00 via INTRAVENOUS
  Filled 2016-12-20: qty 1000

## 2016-12-22 ENCOUNTER — Emergency Department
Admission: EM | Admit: 2016-12-22 | Discharge: 2016-12-22 | Disposition: A | Discharge status: Home / Self Care | Payer: Medicare HMO | Attending: Emergency Medicine | Admitting: Emergency Medicine

## 2016-12-22 ENCOUNTER — Emergency Department: Payer: Medicare HMO

## 2016-12-22 ENCOUNTER — Encounter: Payer: Self-pay | Admitting: Emergency Medicine

## 2016-12-22 DIAGNOSIS — Z7982 Long term (current) use of aspirin: Secondary | ICD-10-CM | POA: Diagnosis not present

## 2016-12-22 DIAGNOSIS — Z85118 Personal history of other malignant neoplasm of bronchus and lung: Secondary | ICD-10-CM | POA: Diagnosis not present

## 2016-12-22 DIAGNOSIS — R1030 Lower abdominal pain, unspecified: Secondary | ICD-10-CM | POA: Insufficient documentation

## 2016-12-22 DIAGNOSIS — Z87891 Personal history of nicotine dependence: Secondary | ICD-10-CM | POA: Diagnosis not present

## 2016-12-22 DIAGNOSIS — K5900 Constipation, unspecified: Secondary | ICD-10-CM | POA: Diagnosis present

## 2016-12-22 MED ORDER — MAGNESIUM HYDROXIDE 400 MG/5ML PO SUSP
960.0000 mL | Freq: Once | ORAL | Status: DC
  Filled 2016-12-22: qty 240

## 2016-12-22 MED ORDER — FLEET ENEMA 7-19 GM/118ML RE ENEM: 1.0000 | ENEMA | Freq: Once | RECTAL | Status: DC

## 2016-12-22 NOTE — ED Notes (Signed)
Patient had large loose and formed stool. Patient states he feels so much better now.

## 2016-12-22 NOTE — Discharge Instructions (Signed)
Please take milk of magnesia and/or MiraLAX as needed for constipation. Follow-up with PCP if no improvement. Return to the ER for any worsening symptoms urgent changes in her health.

## 2016-12-22 NOTE — ED Provider Notes (Signed)
[facility] Provider Note   CSN: [id number] Arrival date & time: 12/22/16  0263     History   Chief Complaint Chief Complaint  Patient presents with  . Constipation    HPI Noah Coleman is a 83 y.o. male resents to the emergency department for evaluation of constipation. Patient developed constipation yesterday. Last bowel movement was 2 days ago. She states he has mild lower abdominal pain and difficulty going to the bathroom. He recently received chemotherapy. He is also on oxycodone for pain, takes 2 tablets daily. Patient's lower abdominal pain is 6 out of 10. He is eating and tolerating by mouth well. No nausea or vomiting. No diarrhea. He denies any fevers. No history of bowel obstruction or abdominal surgery.  HPI  Past Medical History:  Diagnosis Date  . Anemia   . BPH (benign prostatic hyperplasia)   . Chronic idiopathic urticaria   . Diverticulosis   . Dupuytren's contracture of right hand   . History of hiatal hernia   . Hyperlipemia   . Lung cancer ([place])   . Shingles     Patient Active Problem List   Diagnosis Date Noted  . Anemia 12/06/2016  . Thrombocytopenia ([place]) 12/06/2016  . Constipation due to opioid therapy 10/18/2016  . 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 Goals of care, counseling/discussion 09/27/2016  . Mass of upper lobe of left lung 09/01/2016  . 2-vessel coronary artery disease 08/19/2016  . Small cell lung cancer, left ([place]) 08/19/2016  . Chest pain on exertion 08/07/2016  . Chronic urticaria 05/30/2016  . Cyst of pancreas 05/30/2016  . B12 deficiency 02/05/2016  . Dupuytren's contracture of right hand 05/29/2015    Past Surgical History:  Procedure Laterality Date  . CARDIAC CATHETERIZATION Left 08/12/2016   Procedure: Left Heart Cath and Coronary Angiography;  Surgeon: Teodoro Spray, MD;  Location: [facility];  Service: Cardiovascular;  Laterality: Left;  . COLONOSCOPY    . EUS N/A  02/22/2016   Procedure: UPPER ENDOSCOPIC ULTRASOUND (EUS) LINEAR;  Surgeon: Cora Daniels, MD;  [facility] END[facility];  Service: Endoscopy;  Laterality: N/A;  . TONSILLECTOMY         Home Medications    Prior to Admission medications   Medication Sig Start Date End Date Taking? Authorizing Provider  ALPRAZolam Duanne Moron) 0.5 MG tablet Take 1 tablet (0.5 mg total) by mouth daily. Take 1 hour before radiation 10/30/16   Noreene Filbert, MD  aspirin EC 81 MG tablet Take 1 tablet by mouth 1 day or 1 dose.    Historical Provider, MD  Cyanocobalamin (RA VITAMIN B-12 TR) 1000 MCG TBCR Take 1 tablet by mouth 1 day or 1 dose.    Historical Provider, MD  diphenhydrAMINE (BENADRYL) 25 mg capsule Take 25 mg by mouth every 6 (six) hours as needed.    Historical Provider, MD  docusate sodium (COLACE) 100 MG capsule Take 1 capsule (100 mg total) by mouth 2 (two) times daily. 10/14/16 10/14/17  Lavonia Drafts, MD  Fluticasone-Salmeterol (ADVAIR DISKUS) 250-50 MCG/DOSE AEPB Inhale into the lungs. 09/30/16 09/30/17  Historical Provider, MD  linaclotide Rolan Lipa) 145 MCG CAPS capsule Take 145 mcg by mouth. 10/18/16   Historical Provider, MD  loratadine-pseudoephedrine (CLARITIN-D 24-HOUR) 10-240 MG 24 hr tablet Take 1 tablet by mouth daily.    Historical Provider, MD  megestrol (MEGACE) 40 MG tablet Take 1 tablet (40 mg total) by mouth daily. 10/09/16   Lloyd Huger, MD  Multiple Vitamin (MULTIVITAMIN) capsule Take 1 capsule by mouth daily.    Historical Provider, MD  ondansetron (ZOFRAN) 8 MG tablet Take 1 tablet (8 mg total) by mouth 2 (two) times daily as needed for refractory nausea / vomiting. 09/27/16   Lloyd Huger, MD  Oxycodone HCl 10 MG TABS Take 1-2 tablets (10-20 mg total) by mouth every 4 (four) hours as needed. 12/18/16   Lloyd Huger, MD  Probiotic Product (PROBIOTIC DAILY) CAPS Take 1 capsule by mouth daily.    Historical Provider, MD  prochlorperazine (COMPAZINE) 10 MG tablet TAKE  ONE TABLET EVERY 6 HOURS AS NEEDED FOR NAUSEA / VOMITING 12/19/16   Lloyd Huger, MD  sucralfate (CARAFATE) 1 g tablet Take 1 tablet (1 g total) by mouth 2 (two) times daily. Dissolve in 2-3 tbsp warm water, swish and swallow 11/21/16   Noreene Filbert, MD    Family History Family History  Problem Relation Age of Onset  . Cancer Mother     unknown type  . Lung cancer Father   . Colon cancer Father     Social History Social History  Substance Use Topics  . Smoking status: Former Smoker    Years: 30.00    Types: Cigarettes    Quit date: 08/12/1981  . Smokeless tobacco: Never Used  . Alcohol use No     Allergies   Patient has no known allergies.   Review of Systems Review of Systems  Constitutional: Negative.  Negative for activity change, appetite change, chills and fever.  HENT: Negative for congestion, ear pain, mouth sores, rhinorrhea, sinus pressure, sore throat and trouble swallowing.   Eyes: Negative for photophobia, pain and discharge.  Respiratory: Negative for cough, chest tightness and shortness of breath.   Cardiovascular: Negative for chest pain and leg swelling.  Gastrointestinal: Positive for abdominal pain and constipation. Negative for abdominal distention, diarrhea, nausea and vomiting.  Genitourinary: Negative for difficulty urinating and dysuria.  Musculoskeletal: Negative for arthralgias, back pain and gait problem.  Skin: Negative for color change and rash.  Neurological: Negative for dizziness and headaches.  Hematological: Negative for adenopathy.  Psychiatric/Behavioral: Negative for agitation and behavioral problems.     Physical Exam Updated Vital Signs BP 135/79 (BP Location: Left Arm)   Pulse (!) 102   Temp 97.5 F (36.4 C) (Oral)   Resp 18   Ht 6' (1.829 m)   Wt 72.1 kg   SpO2 100%   BMI 21.56 kg/m   Physical Exam  Constitutional: He is oriented to person, place, and time. He appears well-developed and well-nourished.  HENT:    Head: Normocephalic and atraumatic.  Eyes: Conjunctivae and EOM are normal. Pupils are equal, round, and reactive to light.  Neck: Normal range of motion. Neck supple.  Cardiovascular: Normal rate, regular rhythm, normal heart sounds and intact distal pulses.   Pulmonary/Chest: Effort normal and breath sounds normal. No respiratory distress. He has no wheezes. He has no rales. He exhibits no tenderness.  Abdominal: Soft. Bowel sounds are normal. He exhibits no distension and no mass. There is no tenderness. There is no guarding.  Musculoskeletal: Normal range of motion. He exhibits no edema or tenderness.  Neurological: He is alert and oriented to person, place, and time.  Skin: Skin is warm and dry.  Psychiatric: He has a normal mood and affect. His behavior is normal. Judgment and thought content normal.     ED Treatments / Results  Labs (all labs ordered are listed, but  only abnormal results are displayed) Labs Reviewed - No data to display  EKG  EKG Interpretation None       Radiology Dg Abdomen 1 View  Result Date: 12/22/2016 CLINICAL DATA:  Constipation, lower abdominal pain, receiving chemotherapy for lung cancer. History constipation. EXAM: ABDOMEN - 1 VIEW COMPARISON:  10/14/2016 FINDINGS: Scattered air and stool throughout the bowel. Negative for obstruction, ileus, or significant dilatation. No free air. Moderate stool burden throughout the colon. Benign pelvic calcifications. abdominopelvic atherosclerosis noted. Bones are osteopenic. T12 compression fracture noted, new since 10/14/2016. Bibasilar scarring/fibrosis in the lung bases, worse on the left. IMPRESSION: Negative for obstruction or ileus.  Moderate colonic stool burden. T12 compression fracture, new since 10/14/2016 Atherosclerosis Electronically Signed   By: Jerilynn Mages.  Shick M.D.   On: 12/22/2016 10:48    Procedures Procedures (including critical care time)  Medications Ordered in ED Medications - No data to  display   Initial Impression / Assessment and Plan / ED Course  I have reviewed the triage vital signs and the nursing notes.  Pertinent labs & imaging results that were available during my care of the patient were reviewed by me and considered in my medical decision making (see chart for details).   83 year old male with constipation for 2 days. Recent chemotherapy also on opioids for cancer pain. Patient's last bowel movement was 2 days ago. Complains of abdominal discomfort, physical exam normal. No nausea or vomiting tolerating by mouth well. Abdominal x-ray showed moderate: Stool burden with no obstruction or ileus. Patient responded well to a soapsuds enema, significant amount of stool was evacuated. Patient's abdominal discomfort completely resolved. Abdominal x-ray did show evidence of new T12 compression fracture, patient is aware of this, states he has no pain in his lower back.  Final Clinical Impressions(s) / ED Diagnoses   Final diagnoses:  Constipation, unspecified constipation type    New Prescriptions New Prescriptions   No medications on file     Duanne Guess, PA-C 12/22/16 1335    Lavonia Drafts, MD 12/22/16 1344

## 2016-12-22 NOTE — ED Triage Notes (Signed)
Patient presents to the ED with constipation that started this morning.  Patient reports he has lung cancer and received chemo and radiation treatment yesterday.  Patient reports he has had similar constipation issues in the past.  Patient reports constipation is causing some discomfort at this time.  Patient is alert and oriented x 4 and patient was ambulatory to triage without difficulty.

## 2016-12-26 ENCOUNTER — Ambulatory Visit: Payer: Medicare HMO

## 2016-12-26 ENCOUNTER — Ambulatory Visit: Payer: Medicare HMO | Admitting: Oncology

## 2016-12-26 ENCOUNTER — Other Ambulatory Visit: Payer: Medicare HMO

## 2016-12-27 ENCOUNTER — Ambulatory Visit: Payer: Medicare HMO

## 2017-01-02 ENCOUNTER — Telehealth: Payer: Self-pay | Admitting: *Deleted

## 2017-01-02 NOTE — Telephone Encounter (Signed)
Called to report that he is having sneezing and congestion for several days and not sure who to call, denies fever. Advised to call PCP Dr Sabra Heck. He agrees to call Dr Sabra Heck

## 2017-01-08 ENCOUNTER — Ambulatory Visit: Payer: Medicare HMO | Admitting: Radiation Oncology

## 2017-01-14 ENCOUNTER — Ambulatory Visit: Payer: Medicare HMO

## 2017-01-16 ENCOUNTER — Ambulatory Visit: Payer: Medicare HMO | Admitting: Oncology

## 2017-01-16 ENCOUNTER — Encounter
Admission: RE | Admit: 2017-01-16 | Discharge: 2017-01-16 | Disposition: A | Discharge status: Home / Self Care | Payer: Medicare HMO | Source: Ambulatory Visit | Attending: Oncology | Admitting: Oncology

## 2017-01-16 ENCOUNTER — Inpatient Hospital Stay: Payer: Medicare HMO | Attending: Radiation Oncology

## 2017-01-16 DIAGNOSIS — K449 Diaphragmatic hernia without obstruction or gangrene: Secondary | ICD-10-CM | POA: Insufficient documentation

## 2017-01-16 DIAGNOSIS — C3412 Malignant neoplasm of upper lobe, left bronchus or lung: Secondary | ICD-10-CM | POA: Insufficient documentation

## 2017-01-16 DIAGNOSIS — Z923 Personal history of irradiation: Secondary | ICD-10-CM | POA: Insufficient documentation

## 2017-01-16 DIAGNOSIS — C7931 Secondary malignant neoplasm of brain: Secondary | ICD-10-CM | POA: Diagnosis present

## 2017-01-16 DIAGNOSIS — C7951 Secondary malignant neoplasm of bone: Secondary | ICD-10-CM | POA: Insufficient documentation

## 2017-01-16 DIAGNOSIS — C3492 Malignant neoplasm of unspecified part of left bronchus or lung: Secondary | ICD-10-CM | POA: Diagnosis not present

## 2017-01-16 DIAGNOSIS — K5900 Constipation, unspecified: Secondary | ICD-10-CM | POA: Insufficient documentation

## 2017-01-16 DIAGNOSIS — Z7982 Long term (current) use of aspirin: Secondary | ICD-10-CM | POA: Insufficient documentation

## 2017-01-16 DIAGNOSIS — E785 Hyperlipidemia, unspecified: Secondary | ICD-10-CM | POA: Insufficient documentation

## 2017-01-16 DIAGNOSIS — D649 Anemia, unspecified: Secondary | ICD-10-CM | POA: Insufficient documentation

## 2017-01-16 DIAGNOSIS — N400 Enlarged prostate without lower urinary tract symptoms: Secondary | ICD-10-CM | POA: Insufficient documentation

## 2017-01-16 DIAGNOSIS — D696 Thrombocytopenia, unspecified: Secondary | ICD-10-CM | POA: Insufficient documentation

## 2017-01-16 DIAGNOSIS — Z87891 Personal history of nicotine dependence: Secondary | ICD-10-CM | POA: Insufficient documentation

## 2017-01-16 DIAGNOSIS — Z79899 Other long term (current) drug therapy: Secondary | ICD-10-CM | POA: Insufficient documentation

## 2017-01-16 LAB — GLUCOSE, CAPILLARY: Glucose-Capillary: 95 mg/dL (ref 65–99)

## 2017-01-16 MED ORDER — FLUDEOXYGLUCOSE F - 18 (FDG) INJECTION
12.0000 | Freq: Once | INTRAVENOUS | Status: AC | PRN
  Administered 2017-01-16: 12.26 via INTRAVENOUS

## 2017-01-16 NOTE — Progress Notes (Signed)
Nutrition Follow-up:   ASSESSMENT:  Nutrition follow-up completed in clinic this am with patient and sister in law.  Patient just completed PET scan this am.    Patient more unsteady coming into clinic this am.  Sister -in-law reports more confusion, poor appetite and not eating.  Patient unable to tell me what he ate for dinner last night.  Reports typically eats oatmeal in the morning for breakfast, sandwiches for lunch, dinner [employer] chicken, soups, stews.  Reports that he has been drinking 2 of the ensure plus drinks per day.    Noted ED visit with constipation on 4/8.  Reports bowels are moving regularly now.  No other nutrition concerns at this time  Medications: reviewed. Patient does not think he is taking appetite stimulant  Labs: reviewed  Anthropometrics:   Weight decreased to 154 lb 5 oz today decreased from 159 lb 4.8 oz.   3% weight loss in the last month.  NUTRITION DIAGNOSIS: Inadequate oral intake continues   MALNUTRITION DIAGNOSIS: will continue to assess   INTERVENTION:   Recommended increase to 3 ensure plus per day.  Patient reports he can do this as he enjoys ensure.  Samples and coupons given. Encouraged patient to take megace, appetite stimulant that MD has prescribed. Encouraged small frequent meals during the day.   Discussed ways to add calories and protein.  Patient reports that he still has handout on desk at home that was given last time.     MONITORING, EVALUATION, GOAL: Patient will consume adequate calories and protein to meet nutritional needs   NEXT VISIT: as needed  Joffrey Kerce B. Zenia Resides, [place], [place] Registered Dietitian [phone] (pager)

## 2017-01-19 NOTE — Progress Notes (Signed)
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  INTERVAL HISTORY: Patient returns to clinic today for further evaluation and discussion of his imaging results. He currently feels well and is asymptomatic. His pain is better controlled. He continues to have mild weakness and fatigue. He does not complain of dyspnea on exertion or shortness of breath. His constipation is better controlled as well. He has no neurologic complaints. He denies any recent fevers or illnesses. He has a fair appetite and denies weight loss. He has no chest pain, hemoptysis, or cough. He has no nausea, vomiting, or diarrhea. He has no urinary complaints. Patient offers no further specific complaints today.  REVIEW OF SYSTEMS:   Review of Systems  Constitutional: Positive for malaise/fatigue. Negative for fever and weight loss.  Respiratory: Negative for cough, hemoptysis and shortness of breath.   Cardiovascular: Negative.  Negative for chest pain and leg swelling.  Gastrointestinal: Negative for abdominal pain and constipation.  Genitourinary: Negative.   Musculoskeletal: Positive for back pain.  Neurological: Positive for weakness.  Psychiatric/Behavioral: Negative.  The patient is not nervous/anxious.     As per HPI. Otherwise, a complete review of systems is negative.  PAST MEDICAL HISTORY: Past Medical History:  Diagnosis Date  . Anemia   . BPH (benign prostatic hyperplasia)   . Chronic idiopathic urticaria   . Diverticulosis   . Dupuytren's contracture of right hand   . History of hiatal hernia   . Hyperlipemia   . Lung cancer ([place])   . Shingles     PAST SURGICAL HISTORY: Past Surgical History:  Procedure Laterality Date  . CARDIAC  CATHETERIZATION Left 08/12/2016   Procedure: Left Heart Cath and Coronary Angiography;  Surgeon: Teodoro Spray, MD;  Location: [facility];  Service: Cardiovascular;  Laterality: Left;  . COLONOSCOPY    . EUS N/A 02/22/2016   Procedure: UPPER ENDOSCOPIC ULTRASOUND (EUS) LINEAR;  Surgeon: Cora Daniels, MD;  Location[facility] ENDOSCOPY;  Service: Endoscopy;  Laterality: N/A;  . TONSILLECTOMY      FAMILY HISTORY: Family History  Problem Relation Age of Onset  . Cancer Mother        unknown type  . Lung cancer Father   . Colon cancer Father     ADVANCED DIRECTIVES (Y/N):  N  HEALTH MAINTENANCE: Social History  Substance Use Topics  . Smoking status: Former Smoker    Years: 30.00    Types: Cigarettes    Quit date: 08/12/1981  . Smokeless tobacco: Never Used  . Alcohol use No     Colonoscopy:  PAP:  Bone density:  Lipid panel:  No Known Allergies  Current Outpatient Prescriptions  Medication Sig Dispense Refill  . ALPRAZolam (XANAX) 0.5 MG tablet Take 1 tablet (0.5 mg total) by mouth daily. Take 1 hour before radiation 10 tablet 0  . aspirin EC 81 MG tablet Take 1 tablet by mouth 1 day or 1 dose.    . Cyanocobalamin (RA VITAMIN B-12 TR) 1000 MCG TBCR Take 1 tablet by mouth 1 day or 1 dose.    . diphenhydrAMINE (BENADRYL) 25 mg capsule Take 25 mg by mouth every 6 (six) hours as needed.    . docusate sodium (COLACE) 100 MG capsule Take 1 capsule (100  mg total) by mouth 2 (two) times daily. 60 capsule 2  . Fluticasone-Salmeterol (ADVAIR DISKUS) 250-50 MCG/DOSE AEPB Inhale into the lungs.    Marland Kitchen linaclotide (LINZESS) 145 MCG CAPS capsule Take 145 mcg by mouth.    . loratadine-pseudoephedrine (CLARITIN-D 24-HOUR) 10-240 MG 24 hr tablet Take 1 tablet by mouth daily.    . megestrol (MEGACE) 40 MG tablet Take 1 tablet (40 mg total) by mouth daily. 30 tablet 1  . Multiple Vitamin (MULTIVITAMIN) capsule Take 1 capsule by mouth daily.    . ondansetron (ZOFRAN) 8 MG tablet  Take 1 tablet (8 mg total) by mouth 2 (two) times daily as needed for refractory nausea / vomiting. 30 tablet 1  . Oxycodone HCl 10 MG TABS Take 1-2 tablets (10-20 mg total) by mouth every 6 (six) hours as needed. 40 tablet 0  . Probiotic Product (PROBIOTIC DAILY) CAPS Take 1 capsule by mouth daily.    . sucralfate (CARAFATE) 1 g tablet Take 1 tablet (1 g total) by mouth 2 (two) times daily. Dissolve in 2-3 tbsp warm water, swish and swallow 60 tablet 3  . prochlorperazine (COMPAZINE) 10 MG tablet TAKE ONE TABLET BY MOUTH EVERY 6 HOURS AS NEEDED FOR NAUSEA / VOMITING 30 tablet 5   No current facility-administered medications for this visit.    Facility-Administered Medications Ordered in Other Visits  Medication Dose Route Frequency Provider Last Rate Last Dose  . 0.9 %  sodium chloride infusion  250 mL Intravenous PRN Teodoro Spray, MD      . 0.9 %  sodium chloride infusion  250 mL Intravenous PRN Teodoro Spray, MD      . 0.9% sodium chloride infusion  1 mL/kg/hr Intravenous Continuous Teodoro Spray, MD      . 0.9% sodium chloride infusion  1 mL/kg/hr Intravenous Continuous Teodoro Spray, MD      . sodium chloride flush (NS) 0.9 % injection 3 mL  3 mL Intravenous Q12H Teodoro Spray, MD      . sodium chloride flush (NS) 0.9 % injection 3 mL  3 mL Intravenous PRN Teodoro Spray, MD      . sodium chloride flush (NS) 0.9 % injection 3 mL  3 mL Intravenous Q12H Teodoro Spray, MD      . sodium chloride flush (NS) 0.9 % injection 3 mL  3 mL Intravenous PRN Teodoro Spray, MD        OBJECTIVE: Vitals:   01/21/17 1148  BP: 134/78  Pulse: 83  Resp: 18  Temp: 98.2 F (36.8 C)     Body mass index is 20.76 kg/m.    ECOG FS:1 - Symptomatic but completely ambulatory  General: Well-developed, well-nourished, no acute distress. Eyes: Pink conjunctiva, anicteric sclera. Lungs: Clear to auscultation bilaterally. Heart: Regular rate and rhythm. No rubs, murmurs, or gallops. Abdomen:  Soft, nontender, nondistended. No organomegaly noted, normoactive bowel sounds. Musculoskeletal: No edema, cyanosis, or clubbing. Neuro: Alert, answering all questions appropriately. Cranial nerves grossly intact. Skin: No rashes or petechiae noted. Psych: Normal affect.   LAB RESULTS:  Lab Results  Component Value Date   NA 135 01/23/2017   K 4.3 01/23/2017   CL 105 01/23/2017   CO2 27 01/23/2017   GLUCOSE 109 (H) 01/23/2017   BUN 20 01/23/2017   CREATININE 1.48 (H) 01/23/2017   CALCIUM 8.6 (L) 01/23/2017   PROT 7.0 01/23/2017   ALBUMIN 3.9 01/23/2017   AST 23 01/23/2017   ALT 13 (L)  01/23/2017   ALKPHOS 35 (L) 01/23/2017   BILITOT 0.5 01/23/2017   GFRNONAA 41 (L) 01/23/2017   GFRAA 48 (L) 01/23/2017    Lab Results  Component Value Date   WBC 6.3 01/23/2017   NEUTROABS 4.4 01/23/2017   HGB 7.8 (L) 01/23/2017   HCT 22.5 (L) 01/23/2017   MCV 112.5 (H) 01/23/2017   PLT 226 01/23/2017     STUDIES: Nm Pet Image Restag (ps) Skull Base To Thigh  Result Date: 01/16/2017 CLINICAL DATA:  Subsequent treatment strategy for small cell lung cancer metastatic to brain. EXAM: NUCLEAR MEDICINE PET SKULL BASE TO THIGH TECHNIQUE: 12.3 mCi F-18 FDG was injected intravenously. Full-ring PET imaging was performed from the skull base to thigh after the radiotracer. CT data was obtained and used for attenuation correction and anatomic localization. FASTING BLOOD GLUCOSE:  Value: 95 mg/dl COMPARISON:  Multiple exams, including 11/13/2015 FINDINGS: NECK No hypermetabolic lymph nodes in the neck. CHEST Dramatic response to therapy with resolution of the left hilar and mediastinal adenopathy Near resolution of the peripheral cavitary nodule which currently has a bandlike appearance measuring 2.6 by 0.9 cm, previous standard uptake value 15.1, current maximum SUV 2.4. Residual trace left pleural effusion with interstitial and faint airspace opacities in the lung bases favoring the lower lobes, but  resolution of the hypermetabolic left lower lobe and pleural foci. Resolution of the prior left upper lobe satellite nodules. Coronary, aortic arch, and branch vessel atherosclerotic vascular disease. Type 1 hiatal hernia. ABDOMEN/PELVIS The previous hypermetabolic pancreatic foci have resolved. Previously hypermetabolic left periaortic lymph node has resolved. Previous left perirenal space hypermetabolic nodule below the lower pole the left kidney has resolved. No new hypermetabolic abnormality in the abdomen or pelvis. Aortoiliac atherosclerotic vascular disease. Several rim calcified omental lesions are present and there is a 2.3 cm rim calcified lesion along the posterior margin of the right urinary bladder just below the sigmoid colon and anterior to the right side of the rectum, not changed from prior. Old right pelvic fracture, healed. SKELETON Interval T12 compression fracture, no hypermetabolic activity at this level to suggest residual metastatic disease. Prior metastatic lesions to the sacrum, left iliac bone, and upper thoracic spine are no longer hypermetabolic and have resolved. No new significant hypermetabolic metastatic disease. IMPRESSION: 1. Dramatic response to therapy, with resolution of the osseous hypermetabolic activity; resolution of the intra-abdominal foci of abdominal activity including the retroperitoneal and pancreatic metastatic lesions ; resolution of the adenopathy in the chest ; and prominent reduction in size and activity of the left upper lobe cavitary nodule, which currently has a maximum SUV of 2.4. The left pleural metastatic hypermetabolic foci have resolved and there is only a trace left pleural effusion. 2. Other imaging findings of potential clinical significance: Aortic Atherosclerosis (ICD10-I70.0). Coronary atherosclerosis. Interval T12 compression fracture. Electronically Signed   By: Van Clines M.D.   On: 01/16/2017 11:47    ASSESSMENT: Stage IV small cell  cancer of the left lung with bony metastasis.  PLAN:    1. 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 MRI the brain also reviewed independently revealing metastatic disease. PET scan results reviewed independently and reported as above with dramatic response to therapy with near complete resolution of metabolic activity. No further chemotherapy is needed at this time. But given his bony metastasis, we will continue Zometa every 4 weeks. Return to clinic on February 20, 2017 for further evaluation  and continuation of Zometa. Plan to reimage after 3 months in approximately August 2018. He continues to express interest in continued quality of life over quantity of life.   2. Hypertension: Patient's blood pressure is within normal limits today, monitor. 3. Pain: Continue fentanyl patch to 50 g every 72 hours and 1-2 tabs of oxycodone as needed. 4. Brain metastasis: Treatment per radiation oncology. Patient has completed palliative XRT. 5. Shortness of breath: Improved. Patient no longer requires home oxygen. 6. Constipation: Patient has been instructed to use stool softeners daily and magnesium citrate as needed. 7. Back pain: MRI of the thoracic spine on November 02, 2016 revealed likely pathologic compression fracture. He has completed palliative XRT. 8. Anemia: Likely contributing to patient's weakness and fatigue. Improved with recent transfusion. 9. Thrombocytopenia: Improved, monitor.  Patient expressed understanding and was in agreement with this plan. He also understands that He can call clinic at any time with any questions, concerns, or complaints.   Cancer Staging Small cell lung cancer, left ([place]) Staging form: Lung, AJCC 7th Edition - Clinical stage from 09/25/2016: Stage IV (T2, N2, M1b) - Signed by Lloyd Huger, MD on 09/25/2016   Lloyd Huger, MD   01/27/2017 10:05 AM

## 2017-01-21 ENCOUNTER — Inpatient Hospital Stay (HOSPITAL_BASED_OUTPATIENT_CLINIC_OR_DEPARTMENT_OTHER): Payer: Medicare HMO | Admitting: Oncology

## 2017-01-21 VITALS — BP 134/78 | HR 83 | Temp 98.2°F | Resp 18 | Wt 153.1 lb

## 2017-01-21 DIAGNOSIS — C3492 Malignant neoplasm of unspecified part of left bronchus or lung: Secondary | ICD-10-CM

## 2017-01-21 DIAGNOSIS — Z923 Personal history of irradiation: Secondary | ICD-10-CM

## 2017-01-21 DIAGNOSIS — D696 Thrombocytopenia, unspecified: Secondary | ICD-10-CM

## 2017-01-21 DIAGNOSIS — C7951 Secondary malignant neoplasm of bone: Secondary | ICD-10-CM

## 2017-01-21 DIAGNOSIS — Z79899 Other long term (current) drug therapy: Secondary | ICD-10-CM | POA: Diagnosis not present

## 2017-01-21 DIAGNOSIS — N400 Enlarged prostate without lower urinary tract symptoms: Secondary | ICD-10-CM | POA: Diagnosis not present

## 2017-01-21 DIAGNOSIS — C7931 Secondary malignant neoplasm of brain: Secondary | ICD-10-CM

## 2017-01-21 DIAGNOSIS — Z87891 Personal history of nicotine dependence: Secondary | ICD-10-CM | POA: Diagnosis not present

## 2017-01-21 DIAGNOSIS — K449 Diaphragmatic hernia without obstruction or gangrene: Secondary | ICD-10-CM | POA: Diagnosis not present

## 2017-01-21 DIAGNOSIS — K5900 Constipation, unspecified: Secondary | ICD-10-CM

## 2017-01-21 DIAGNOSIS — D649 Anemia, unspecified: Secondary | ICD-10-CM | POA: Diagnosis not present

## 2017-01-21 DIAGNOSIS — E785 Hyperlipidemia, unspecified: Secondary | ICD-10-CM | POA: Diagnosis not present

## 2017-01-21 DIAGNOSIS — Z7982 Long term (current) use of aspirin: Secondary | ICD-10-CM | POA: Diagnosis not present

## 2017-01-21 DIAGNOSIS — C3412 Malignant neoplasm of upper lobe, left bronchus or lung: Secondary | ICD-10-CM | POA: Diagnosis not present

## 2017-01-21 MED ORDER — OXYCODONE HCL 10 MG PO TABS: 10.0000 mg | ORAL_TABLET | Freq: Four times a day (QID) | ORAL | 0 refills | Status: AC | PRN

## 2017-01-23 ENCOUNTER — Inpatient Hospital Stay: Payer: Medicare HMO

## 2017-01-23 ENCOUNTER — Encounter: Payer: Self-pay | Admitting: Radiation Oncology

## 2017-01-23 ENCOUNTER — Ambulatory Visit
Admission: RE | Admit: 2017-01-23 | Discharge: 2017-01-23 | Disposition: A | Discharge status: Home / Self Care | Payer: Medicare HMO | Source: Ambulatory Visit | Attending: Radiation Oncology | Admitting: Radiation Oncology

## 2017-01-23 VITALS — BP 145/74 | HR 88 | Temp 98.6°F | Resp 18

## 2017-01-23 VITALS — BP 147/79 | HR 92 | Temp 97.0°F | Resp 18 | Wt 156.9 lb

## 2017-01-23 DIAGNOSIS — C3492 Malignant neoplasm of unspecified part of left bronchus or lung: Secondary | ICD-10-CM

## 2017-01-23 DIAGNOSIS — C7951 Secondary malignant neoplasm of bone: Secondary | ICD-10-CM | POA: Insufficient documentation

## 2017-01-23 DIAGNOSIS — C3412 Malignant neoplasm of upper lobe, left bronchus or lung: Secondary | ICD-10-CM | POA: Insufficient documentation

## 2017-01-23 DIAGNOSIS — C7931 Secondary malignant neoplasm of brain: Secondary | ICD-10-CM | POA: Diagnosis not present

## 2017-01-23 DIAGNOSIS — Z923 Personal history of irradiation: Secondary | ICD-10-CM | POA: Diagnosis not present

## 2017-01-23 LAB — COMPREHENSIVE METABOLIC PANEL
ALK PHOS: 35 U/L — AB (ref 38–126)
ALT: 13 U/L — AB (ref 17–63)
ANION GAP: 3 — AB (ref 5–15)
AST: 23 U/L (ref 15–41)
Albumin: 3.9 g/dL (ref 3.5–5.0)
BILIRUBIN TOTAL: 0.5 mg/dL (ref 0.3–1.2)
BUN: 20 mg/dL (ref 6–20)
CALCIUM: 8.6 mg/dL — AB (ref 8.9–10.3)
CO2: 27 mmol/L (ref 22–32)
CREATININE: 1.48 mg/dL — AB (ref 0.61–1.24)
Chloride: 105 mmol/L (ref 101–111)
GFR, EST AFRICAN AMERICAN: 48 mL/min — AB (ref >60)
GFR, EST NON AFRICAN AMERICAN: 41 mL/min — AB (ref >60)
Glucose, Bld: 109 mg/dL — ABNORMAL HIGH (ref 65–99)
Potassium: 4.3 mmol/L (ref 3.5–5.1)
SODIUM: 135 mmol/L (ref 135–145)
TOTAL PROTEIN: 7 g/dL (ref 6.5–8.1)

## 2017-01-23 LAB — CBC WITH DIFFERENTIAL/PLATELET
Basophils Absolute: 0 10*3/uL (ref 0–0.1)
Basophils Relative: 1 %
EOS PCT: 1 %
Eosinophils Absolute: 0.1 10*3/uL (ref 0–0.7)
HCT: 22.5 % — ABNORMAL LOW (ref 40.0–52.0)
Hemoglobin: 7.8 g/dL — ABNORMAL LOW (ref 13.0–18.0)
LYMPHS PCT: 18 %
Lymphs Abs: 1.1 10*3/uL (ref 1.0–3.6)
MCH: 39 pg — ABNORMAL HIGH (ref 26.0–34.0)
MCHC: 34.6 g/dL (ref 32.0–36.0)
MCV: 112.5 fL — AB (ref 80.0–100.0)
MONOS PCT: 10 %
Monocytes Absolute: 0.6 10*3/uL (ref 0.2–1.0)
Neutro Abs: 4.4 10*3/uL (ref 1.4–6.5)
Neutrophils Relative %: 70 %
PLATELETS: 226 10*3/uL (ref 150–440)
RBC: 2 MIL/uL — ABNORMAL LOW (ref 4.40–5.90)
RDW: 24 % — ABNORMAL HIGH (ref 11.5–14.5)
WBC: 6.3 10*3/uL (ref 3.8–10.6)

## 2017-01-23 MED ORDER — ZOLEDRONIC ACID 4 MG/5ML IV CONC
3.3000 mg | Freq: Once | INTRAVENOUS | Status: AC
  Administered 2017-01-23: 3.3 mg via INTRAVENOUS
  Filled 2017-01-23: qty 4.13

## 2017-01-23 MED ORDER — SODIUM CHLORIDE 0.9 % IV SOLN
Freq: Once | INTRAVENOUS | Status: AC
  Administered 2017-01-23: 15:00:00 via INTRAVENOUS
  Filled 2017-01-23: qty 1000

## 2017-01-23 NOTE — Progress Notes (Signed)
Radiation Oncology Follow up Note  Name: CASIMIR BARCELLOS   Date:   01/23/2017 MRN:  [id number] DOB: 1929/12/24    This 83 y.o. male presents to the clinic today for one-month follow-up status post whole brain radiation for stage IV small cell lung cancer.  REFERRING PROVIDER: Rusty Aus, MD  HPI: Patient is an 83 year old male now out 1 month having completed whole brain radiation for stage IV small cell lung cancer.Edwena Bunde also treated his hip as well as his spine for metastatic disease. He is currently undergoing carboplatinum etoposide under medical oncology's direction. He is tolerating his treatments well. Recently had a PET CT scan  showing dramatic response to therapy with resolution of the osseous metabolic activity retroperitoneal and pancreatic metastatic lesions resolution of adenopathy in the chest and reduction in size of the left upper lobe cavitary nodule. That nodule now has SUV of 2.4. He is doing well he is in no pain he specifically denies cough hemoptysis or chest tightness.  COMPLICATIONS OF TREATMENT: none  FOLLOW UP COMPLIANCE: keeps appointments   PHYSICAL EXAM:  BP (!) 147/79   Pulse 92   Temp 97 F (36.1 C)   Resp 18   Wt 156 lb 13.7 oz (71.2 kg)   BMI 21.27 kg/m  No change in neurologic status. Well-developed well-nourished patient in NAD. HEENT reveals PERLA, EOMI, discs not visualized.  Oral cavity is clear. No oral mucosal lesions are identified. Neck is clear without evidence of cervical or supraclavicular adenopathy. Lungs are clear to A&P. Cardiac examination is essentially unremarkable with regular rate and rhythm without murmur rub or thrill. Abdomen is benign with no organomegaly or masses noted. Motor sensory and DTR levels are equal and symmetric in the upper and lower extremities. Cranial nerves II through XII are grossly intact. Proprioception is intact. No peripheral adenopathy or edema is identified. No motor or sensory levels are noted. Crude  visual fields are within normal range.  RADIOLOGY RESULTS: PET CT scans are reviewed and compared to prior studies  PLAN: Present time patient is done extremely well under current chemotherapy regimen. I see no real reason at this time to treat his chest since it is asymptomatic and markedly reduced in size as well as hypermetabolic activity. I would continue to watch the patient. I be happy to reevaluate him at any time for further palliative treatment. I discussed the case personally with medical oncology. I've asked to see him back in 4 months for follow-up.  I would like to take this opportunity to thank you for allowing me to participate in the care of your patient.Armstead Peaks., MD

## 2017-01-25 ENCOUNTER — Other Ambulatory Visit: Payer: Self-pay | Admitting: Oncology

## 2017-01-25 DIAGNOSIS — C3492 Malignant neoplasm of unspecified part of left bronchus or lung: Secondary | ICD-10-CM

## 2017-01-27 ENCOUNTER — Telehealth: Payer: Self-pay

## 2017-01-27 NOTE — Telephone Encounter (Signed)
-----   Message from Lloyd Huger, MD sent at 01/27/2017 10:46 AM EDT ----- Regarding: RE: Itching Benadryl and OTC cream.  He has not had treatment here in a while. If it does not resolve soon, contact his primary care for further evaluation.  ----- Message ----- From: Shawnee Knapp, RN Sent: 01/27/2017  10:31 AM To: Lloyd Huger, MD, [facility], RN, # Subject: Itching                                        Patient called with c/o itching around neck/arms/back/shoulders x 3 days.  No rash.  Skin is not dry.  He has not tried any creams or benadryl for the itching.  Wanted instructions from his clinical care team.:)

## 2017-01-27 NOTE — Telephone Encounter (Signed)
Advised patient to use benadryl and OTC cream and he will try that if not any better will contact PCP.

## 2017-02-05 ENCOUNTER — Other Ambulatory Visit: Payer: Self-pay | Admitting: Gastroenterology

## 2017-02-05 DIAGNOSIS — K8689 Other specified diseases of pancreas: Secondary | ICD-10-CM

## 2017-02-17 ENCOUNTER — Ambulatory Visit: Payer: Medicare HMO

## 2017-02-19 NOTE — Progress Notes (Signed)
[place]  [phone]) [phone] Fax:(336) [phone]  ID: Noah Coleman OB: 1930/02/14  MR#: [id number]  PPJ#:[id number]  Patient Care Team: Rusty Aus, MD as PCP - General (Internal Medicine)  CHIEF COMPLAINT: Stage IV small cell cancer of the left lung with bony metastasis.  INTERVAL HISTORY: Patient returns to clinic today for further evaluation and continuation of Zometa. He currently feels well and is asymptomatic. His pain is better controlled. He continues to have mild weakness and fatigue. He does not complain of dyspnea on exertion or shortness of breath. His constipation is better controlled as well. He has no neurologic complaints. He denies any recent fevers or illnesses. He has a fair appetite and denies weight loss. He has no chest pain, hemoptysis, or cough. He has no nausea, vomiting, or diarrhea. He has no urinary complaints. Patient offers no further specific complaints today.  REVIEW OF SYSTEMS:   Review of Systems  Constitutional: Negative for fever, malaise/fatigue and weight loss.  Respiratory: Negative for cough, hemoptysis and shortness of breath.   Cardiovascular: Negative.  Negative for chest pain and leg swelling.  Gastrointestinal: Negative for abdominal pain and constipation.  Genitourinary: Negative.   Musculoskeletal: Positive for back pain.  Skin: Positive for rash.  Neurological: Negative for weakness.  Psychiatric/Behavioral: Negative.  The patient is not nervous/anxious.     As per HPI. Otherwise, a complete review of systems is negative.  PAST MEDICAL HISTORY: Past Medical History:  Diagnosis Date  . Anemia   . BPH (benign prostatic hyperplasia)   . Chronic idiopathic urticaria   . Diverticulosis   . Dupuytren's contracture of right hand   . History of hiatal hernia   . Hyperlipemia   . Lung cancer ([place])   . Shingles     PAST SURGICAL HISTORY: Past Surgical History:  Procedure Laterality Date  . CARDIAC  CATHETERIZATION Left 08/12/2016   Procedure: Left Heart Cath and Coronary Angiography;  Surgeon: Teodoro Spray, MD;  Location: [facility];  Service: Cardiovascular;  Laterality: Left;  . COLONOSCOPY    . EUS N/A 02/22/2016   Procedure: UPPER ENDOSCOPIC ULTRASOUND (EUS) LINEAR;  Surgeon: Cora Daniels, MD;  Location: [facility] ENDOSCOPY;  Service: Endoscopy;  Laterality: N/A;  . TONSILLECTOMY      FAMILY HISTORY: Family History  Problem Relation Age of Onset  . Cancer Mother        unknown type  . Lung cancer Father   . Colon cancer Father     ADVANCED DIRECTIVES (Y/N):  N  HEALTH MAINTENANCE: Social History  Substance Use Topics  . Smoking status: Former Smoker    Years: 30.00    Types: Cigarettes    Quit date: 08/12/1981  . Smokeless tobacco: Never Used  . Alcohol use No     Colonoscopy:  PAP:  Bone density:  Lipid panel:  No Known Allergies  Current Outpatient Prescriptions  Medication Sig Dispense Refill  . ALPRAZolam (XANAX) 0.5 MG tablet Take 1 tablet (0.5 mg total) by mouth daily. Take 1 hour before radiation 10 tablet 0  . aspirin EC 81 MG tablet Take 1 tablet by mouth 1 day or 1 dose.    . Cyanocobalamin (RA VITAMIN B-12 TR) 1000 MCG TBCR Take 1 tablet by mouth 1 day or 1 dose.    . diphenhydrAMINE (BENADRYL) 25 mg capsule Take 25 mg by mouth every 6 (six) hours as needed.    . docusate sodium (COLACE) 100 MG capsule Take 1 capsule (  100 mg total) by mouth 2 (two) times daily. 60 capsule 2  . doxepin (SINEQUAN) 10 MG capsule Take 10 mg by mouth at bedtime.    . Fluticasone-Salmeterol (ADVAIR DISKUS) 250-50 MCG/DOSE AEPB Inhale into the lungs.    . hydrOXYzine (ATARAX/VISTARIL) 25 MG tablet TAKE 1/2 TO 1 TABLET BY MOUTH TWICE DAILY AS NEEDED FOR ITCHING    . linaclotide (LINZESS) 145 MCG CAPS capsule Take 145 mcg by mouth.    . loratadine-pseudoephedrine (CLARITIN-D 24-HOUR) 10-240 MG 24 hr tablet Take 1 tablet by mouth daily.    . megestrol (MEGACE)  40 MG tablet Take 1 tablet (40 mg total) by mouth daily. 30 tablet 1  . Multiple Vitamin (MULTIVITAMIN) capsule Take 1 capsule by mouth daily.    . ondansetron (ZOFRAN) 8 MG tablet Take 1 tablet (8 mg total) by mouth 2 (two) times daily as needed for refractory nausea / vomiting. 30 tablet 1  . Oxycodone HCl 10 MG TABS Take 1-2 tablets (10-20 mg total) by mouth every 6 (six) hours as needed. 40 tablet 0  . Probiotic Product (PROBIOTIC DAILY) CAPS Take 1 capsule by mouth daily.    . prochlorperazine (COMPAZINE) 10 MG tablet TAKE ONE TABLET BY MOUTH EVERY 6 HOURS AS NEEDED FOR NAUSEA / VOMITING 30 tablet 5  . sucralfate (CARAFATE) 1 g tablet Take 1 tablet (1 g total) by mouth 2 (two) times daily. Dissolve in 2-3 tbsp warm water, swish and swallow 60 tablet 3   No current facility-administered medications for this visit.    Facility-Administered Medications Ordered in Other Visits  Medication Dose Route Frequency Provider Last Rate Last Dose  . 0.9 %  sodium chloride infusion  250 mL Intravenous PRN Teodoro Spray, MD      . 0.9 %  sodium chloride infusion  250 mL Intravenous PRN Teodoro Spray, MD      . 0.9% sodium chloride infusion  1 mL/kg/hr Intravenous Continuous Teodoro Spray, MD      . 0.9% sodium chloride infusion  1 mL/kg/hr Intravenous Continuous Teodoro Spray, MD      . sodium chloride flush (NS) 0.9 % injection 3 mL  3 mL Intravenous Q12H Teodoro Spray, MD      . sodium chloride flush (NS) 0.9 % injection 3 mL  3 mL Intravenous PRN Teodoro Spray, MD      . sodium chloride flush (NS) 0.9 % injection 3 mL  3 mL Intravenous Q12H Teodoro Spray, MD      . sodium chloride flush (NS) 0.9 % injection 3 mL  3 mL Intravenous PRN Teodoro Spray, MD        OBJECTIVE: Vitals:   02/20/17 1019  BP: (!) 157/75  Pulse: (!) 102  Temp: 97.6 F (36.4 C)     Body mass index is 20.54 kg/m.    ECOG FS:1 - Symptomatic but completely ambulatory  General: Well-developed, well-nourished,  no acute distress. Eyes: Pink conjunctiva, anicteric sclera. Lungs: Clear to auscultation bilaterally. Heart: Regular rate and rhythm. No rubs, murmurs, or gallops. Abdomen: Soft, nontender, nondistended. No organomegaly noted, normoactive bowel sounds. Musculoskeletal: No edema, cyanosis, or clubbing. Neuro: Alert, answering all questions appropriately. Cranial nerves grossly intact. Skin: Mild maculopapular rash noted on upper extremities. Psych: Normal affect.   LAB RESULTS:  Lab Results  Component Value Date   NA 137 02/20/2017   K 4.1 02/20/2017   CL 108 02/20/2017   CO2 22 02/20/2017   GLUCOSE  89 02/20/2017   BUN 22 (H) 02/20/2017   CREATININE 0.97 02/20/2017   CALCIUM 8.8 (L) 02/20/2017   PROT 7.0 02/20/2017   ALBUMIN 4.1 02/20/2017   AST 23 02/20/2017   ALT 15 (L) 02/20/2017   ALKPHOS 31 (L) 02/20/2017   BILITOT 0.5 02/20/2017   GFRNONAA >60 02/20/2017   GFRAA >60 02/20/2017    Lab Results  Component Value Date   WBC 6.3 02/20/2017   NEUTROABS 4.8 02/20/2017   HGB 9.7 (L) 02/20/2017   HCT 27.1 (L) 02/20/2017   MCV 112.5 (H) 02/20/2017   PLT 157 02/20/2017     STUDIES: No results found.  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  PLAN:    1. 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 MRI the brain also reviewed independently revealing metastatic disease. PET scan results from Jan 16, 2017 reviewed independently with dramatic response to therapy with near complete resolution of metabolic activity. No further chemotherapy is needed at this time. But given his bony metastasis, we will continue Zometa every 4 weeks. Plan to reimage after 3 months in approximately August 2018. Return to clinic in 4 weeks for continuation of Zometa. He continues to express interest in continued quality of life over quantity of life.   2. Hypertension: Patient's blood  pressure is within normal limits today, monitor. 3. Pain: Continue fentanyl patch to 50 g every 72 hours and 1-2 tabs of oxycodone as needed. 4. Brain metastasis: Treatment per radiation oncology. Patient has completed palliative XRT. 5. Shortness of breath: Improved. Patient no longer requires home oxygen. 6. Constipation: Patient has been instructed to use stool softeners daily and magnesium citrate as needed. 7. Back pain: MRI of the thoracic spine on November 02, 2016 revealed likely pathologic compression fracture. He has completed palliative XRT. 8. Anemia: Likely contributing to patient's weakness and fatigue. Improved with recent transfusion. 9. Thrombocytopenia: Resolved. 10. Rash: Unclear etiology. Have recommended OTC remedies. Monitor.  Patient expressed understanding and was in agreement with this plan. He also understands that He can call clinic at any time with any questions, concerns, or complaints.   Cancer Staging Small cell lung cancer, left [facility]) Staging form: Lung, AJCC 7th Edition - Clinical stage from 09/25/2016: Stage IV (T2, N2, M1b) - Signed by Lloyd Huger, MD on 09/25/2016   Lloyd Huger, MD   02/25/2017 6:35 PM

## 2017-02-20 ENCOUNTER — Telehealth: Payer: Self-pay | Admitting: *Deleted

## 2017-02-20 ENCOUNTER — Inpatient Hospital Stay: Payer: Medicare HMO | Attending: Oncology

## 2017-02-20 ENCOUNTER — Inpatient Hospital Stay: Payer: Medicare HMO

## 2017-02-20 ENCOUNTER — Inpatient Hospital Stay (HOSPITAL_BASED_OUTPATIENT_CLINIC_OR_DEPARTMENT_OTHER): Payer: Medicare HMO | Admitting: Oncology

## 2017-02-20 VITALS — BP 157/75 | HR 102 | Temp 97.6°F | Wt 151.4 lb

## 2017-02-20 DIAGNOSIS — Z79899 Other long term (current) drug therapy: Secondary | ICD-10-CM

## 2017-02-20 DIAGNOSIS — Z87891 Personal history of nicotine dependence: Secondary | ICD-10-CM | POA: Diagnosis not present

## 2017-02-20 DIAGNOSIS — D649 Anemia, unspecified: Secondary | ICD-10-CM

## 2017-02-20 DIAGNOSIS — C3492 Malignant neoplasm of unspecified part of left bronchus or lung: Secondary | ICD-10-CM | POA: Insufficient documentation

## 2017-02-20 DIAGNOSIS — C7931 Secondary malignant neoplasm of brain: Secondary | ICD-10-CM | POA: Diagnosis not present

## 2017-02-20 DIAGNOSIS — K5900 Constipation, unspecified: Secondary | ICD-10-CM

## 2017-02-20 DIAGNOSIS — C7951 Secondary malignant neoplasm of bone: Secondary | ICD-10-CM

## 2017-02-20 DIAGNOSIS — Z7982 Long term (current) use of aspirin: Secondary | ICD-10-CM | POA: Insufficient documentation

## 2017-02-20 DIAGNOSIS — R21 Rash and other nonspecific skin eruption: Secondary | ICD-10-CM | POA: Diagnosis not present

## 2017-02-20 DIAGNOSIS — N400 Enlarged prostate without lower urinary tract symptoms: Secondary | ICD-10-CM | POA: Insufficient documentation

## 2017-02-20 DIAGNOSIS — K449 Diaphragmatic hernia without obstruction or gangrene: Secondary | ICD-10-CM | POA: Insufficient documentation

## 2017-02-20 DIAGNOSIS — R0602 Shortness of breath: Secondary | ICD-10-CM

## 2017-02-20 DIAGNOSIS — E785 Hyperlipidemia, unspecified: Secondary | ICD-10-CM | POA: Diagnosis not present

## 2017-02-20 LAB — CBC WITH DIFFERENTIAL/PLATELET
Basophils Absolute: 0 10*3/uL (ref 0–0.1)
Basophils Relative: 1 %
EOS ABS: 0.1 10*3/uL (ref 0–0.7)
Eosinophils Relative: 1 %
HEMATOCRIT: 27.1 % — AB (ref 40.0–52.0)
HEMOGLOBIN: 9.7 g/dL — AB (ref 13.0–18.0)
LYMPHS ABS: 0.9 10*3/uL — AB (ref 1.0–3.6)
Lymphocytes Relative: 14 %
MCH: 40 pg — ABNORMAL HIGH (ref 26.0–34.0)
MCHC: 35.6 g/dL (ref 32.0–36.0)
MCV: 112.5 fL — ABNORMAL HIGH (ref 80.0–100.0)
MONOS PCT: 8 %
Monocytes Absolute: 0.5 10*3/uL (ref 0.2–1.0)
NEUTROS PCT: 76 %
Neutro Abs: 4.8 10*3/uL (ref 1.4–6.5)
Platelets: 157 10*3/uL (ref 150–440)
RBC: 2.41 MIL/uL — ABNORMAL LOW (ref 4.40–5.90)
RDW: 13.7 % (ref 11.5–14.5)
WBC: 6.3 10*3/uL (ref 3.8–10.6)

## 2017-02-20 LAB — COMPREHENSIVE METABOLIC PANEL
ALK PHOS: 31 U/L — AB (ref 38–126)
ALT: 15 U/L — ABNORMAL LOW (ref 17–63)
ANION GAP: 7 (ref 5–15)
AST: 23 U/L (ref 15–41)
Albumin: 4.1 g/dL (ref 3.5–5.0)
BILIRUBIN TOTAL: 0.5 mg/dL (ref 0.3–1.2)
BUN: 22 mg/dL — ABNORMAL HIGH (ref 6–20)
CALCIUM: 8.8 mg/dL — AB (ref 8.9–10.3)
CO2: 22 mmol/L (ref 22–32)
CREATININE: 0.97 mg/dL (ref 0.61–1.24)
Chloride: 108 mmol/L (ref 101–111)
Glucose, Bld: 89 mg/dL (ref 65–99)
Potassium: 4.1 mmol/L (ref 3.5–5.1)
SODIUM: 137 mmol/L (ref 135–145)
TOTAL PROTEIN: 7 g/dL (ref 6.5–8.1)

## 2017-02-20 MED ORDER — ZOLEDRONIC ACID 4 MG/5ML IV CONC
3.3000 mg | Freq: Once | INTRAVENOUS | Status: AC
  Administered 2017-02-20: 3.3 mg via INTRAVENOUS
  Filled 2017-02-20: qty 4.13

## 2017-02-20 MED ORDER — SODIUM CHLORIDE 0.9 % IV SOLN
Freq: Once | INTRAVENOUS | Status: AC
  Administered 2017-02-20: 11:00:00 via INTRAVENOUS
  Filled 2017-02-20: qty 1000

## 2017-02-20 NOTE — Progress Notes (Signed)
Nutrition Follow-up:  Nutrition follow-up completed during infusion of zometa this am.  Patient with lung cancer and bony mets.  Noted per MD note improvement in PET so no further chemotherapy planned at this time.    Patient reports that he has been trying to increase his calories and eat foods with high calories.  Reports he has been drinking 2 of the 350 calorie ensure per day.  Reports that he eats oatmeal and eggs for breakfast, usually a sandwich for lunch and meat and vegetable for dinner (out to eat or take out).  Reports moving bowels regularly, no problems chewing or swallowing. No other nutrition symptoms reported.   Medications: reviewed  Labs: reviewed  Anthropometrics:   Weight decreased to 151 lb 7 oz today from 156 lb 13.7 oz on 5/10 159 lb noted on 4/5 initial assessment  5% weight loss in 2 months  Patient surprised at lower weight today.  NUTRITION DIAGNOSIS: Inadequate oral intake continues   INTERVENTION:   Discussed strategies for increasing calories and protein with current foods patient is eating.   Recommend for patient to drink 3, 350 calorie oral nutrition supplements.  Coupons given today. Recipes given for additional ways to add calories and protein to shakes.    MONITORING, EVALUATION, GOAL: Patient will consume adequate calories and protein to meet nutritional needs.    NEXT VISIT: as needed   Adabella Stanis B. Zenia Resides, [place], [place] Registered Dietitian [phone] (pager)

## 2017-02-20 NOTE — Telephone Encounter (Signed)
Patient called and said he forgot to ask when he was here this morning if he can go back to dentist to have dental work resumed. Please advise

## 2017-02-20 NOTE — Telephone Encounter (Signed)
OK per Dr Grayland Ormond to proceed with dental work left message on patient VM OK to proceed

## 2017-02-20 NOTE — Progress Notes (Signed)
Patient here today for follow up.  Patient c/o itching and rash that started about 3 weeks ago

## 2017-02-28 ENCOUNTER — Telehealth: Payer: Self-pay | Admitting: *Deleted

## 2017-02-28 ENCOUNTER — Other Ambulatory Visit: Payer: Self-pay | Admitting: Oncology

## 2017-02-28 DIAGNOSIS — C3492 Malignant neoplasm of unspecified part of left bronchus or lung: Secondary | ICD-10-CM

## 2017-02-28 MED ORDER — PROCHLORPERAZINE MALEATE 10 MG PO TABS: ORAL_TABLET | ORAL | 1 refills | Status: AC

## 2017-02-28 NOTE — Telephone Encounter (Signed)
Needs refill of compazine

## 2017-03-03 ENCOUNTER — Telehealth: Payer: Self-pay | Admitting: *Deleted

## 2017-03-03 NOTE — Telephone Encounter (Signed)
Patient notified of Dr Gary Fleet response and will contact a dermatologist

## 2017-03-03 NOTE — Telephone Encounter (Signed)
Unlikely from radiation he received previously. The only thing he is receiving now is Zometa which is also unlikely the cause. Would consider a dermatology referral.

## 2017-03-03 NOTE — Telephone Encounter (Signed)
Called to report that he has a rash on his back and chest red raised bumps which itch. Dr Sabra Heck treated with Hydroxyzine and when that did not work, he ordered Aquaphor. He states that is not working either and he is wandering if it could be coming from the radiation therapy he completed 3 months ago. He is asking that something be done for it. Please advise

## 2017-03-04 ENCOUNTER — Other Ambulatory Visit: Payer: Self-pay | Admitting: Gastroenterology

## 2017-03-04 ENCOUNTER — Ambulatory Visit
Admission: RE | Admit: 2017-03-04 | Discharge: 2017-03-04 | Disposition: A | Discharge status: Home / Self Care | Payer: Medicare HMO | Source: Ambulatory Visit | Attending: Gastroenterology | Admitting: Gastroenterology

## 2017-03-04 DIAGNOSIS — K869 Disease of pancreas, unspecified: Secondary | ICD-10-CM | POA: Insufficient documentation

## 2017-03-04 DIAGNOSIS — M4854XS Collapsed vertebra, not elsewhere classified, thoracic region, sequela of fracture: Secondary | ICD-10-CM | POA: Diagnosis not present

## 2017-03-04 DIAGNOSIS — N281 Cyst of kidney, acquired: Secondary | ICD-10-CM | POA: Insufficient documentation

## 2017-03-04 DIAGNOSIS — K8689 Other specified diseases of pancreas: Secondary | ICD-10-CM | POA: Insufficient documentation

## 2017-03-04 MED ORDER — GADOBENATE DIMEGLUMINE 529 MG/ML IV SOLN
15.0000 mL | Freq: Once | INTRAVENOUS | Status: AC | PRN
  Administered 2017-03-04: 14 mL via INTRAVENOUS

## 2017-03-07 ENCOUNTER — Other Ambulatory Visit: Payer: Self-pay | Admitting: Oncology

## 2017-03-17 NOTE — Progress Notes (Signed)
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  INTERVAL HISTORY: Patient returns to clinic today for further evaluation and continuation of Zometa. He is having some new onset right-sided head pain that radiates down to his neck. He also has noticed a worsening tremor. He continues to have mild weakness and fatigue. He does not complain of dyspnea on exertion or shortness of breath. His constipation is better controlled as well. He has no neurologic complaints. He denies any recent fevers or illnesses. He has a fair appetite and denies weight loss. He has no chest pain, hemoptysis, or cough. He has no nausea, vomiting, or diarrhea. He has no urinary complaints. Patient offers no further specific complaints today.  REVIEW OF SYSTEMS:   Review of Systems  Constitutional: Negative for fever, malaise/fatigue and weight loss.  Respiratory: Negative for cough, hemoptysis and shortness of breath.   Cardiovascular: Negative.  Negative for chest pain and leg swelling.  Gastrointestinal: Negative for abdominal pain and constipation.  Genitourinary: Negative.   Musculoskeletal: Positive for back pain.  Skin: Negative.  Negative for rash.  Neurological: Positive for tremors. Negative for sensory change and weakness.  Psychiatric/Behavioral: Negative.  The patient is not nervous/anxious.     As per HPI. Otherwise, a complete review of systems is negative.  PAST MEDICAL HISTORY: Past Medical History:  Diagnosis Date  . Anemia   . BPH (benign prostatic hyperplasia)   . Chronic idiopathic urticaria   . Diverticulosis   . Dupuytren's contracture of right hand   . History of hiatal hernia   . Hyperlipemia   . Lung cancer ([place])   . Shingles       PAST SURGICAL HISTORY: Past Surgical History:  Procedure Laterality Date  . CARDIAC CATHETERIZATION Left 08/12/2016   Procedure: Left Heart Cath and Coronary Angiography;  Surgeon: Teodoro Spray, MD;  Location: [facility];  Service: Cardiovascular;  Laterality: Left;  . COLONOSCOPY    . EUS N/A 02/22/2016   Procedure: UPPER ENDOSCOPIC ULTRASOUND (EUS) LINEAR;  Surgeon: Cora Daniels, MD;  Location: [facility] ENDOSCOPY;  Service: Endoscopy;  Laterality: N/A;  . TONSILLECTOMY      FAMILY HISTORY: Family History  Problem Relation Age of Onset  . Cancer Mother        unknown type  . Lung cancer Father   . Colon cancer Father     ADVANCED DIRECTIVES (Y/N):  N  HEALTH MAINTENANCE: Social History  Substance Use Topics  . Smoking status: Former Smoker    Years: 30.00    Types: Cigarettes    Quit date: 08/12/1981  . Smokeless tobacco: Never Used  . Alcohol use No     Colonoscopy:  PAP:  Bone density:  Lipid panel:  No Known Allergies  Current Outpatient Prescriptions  Medication Sig Dispense Refill  . ALPRAZolam (XANAX) 0.5 MG tablet Take 1 tablet (0.5 mg total) by mouth daily. Take 1 hour before radiation 10 tablet 0  . aspirin EC 81 MG tablet Take 1 tablet by mouth 1 day or 1 dose.    . Cyanocobalamin (RA VITAMIN B-12 TR) 1000 MCG TBCR Take 1 tablet by mouth 1 day or 1 dose.    . diphenhydrAMINE (BENADRYL) 25 mg capsule Take 25 mg by mouth  every 6 (six) hours as needed.    . docusate sodium (COLACE) 100 MG capsule Take 1 capsule (100 mg total) by mouth 2 (two) times daily. 60 capsule 2  . doxepin (SINEQUAN) 10 MG capsule Take 10 mg by mouth at bedtime.    . Fluticasone-Salmeterol (ADVAIR DISKUS) 250-50 MCG/DOSE AEPB Inhale into the lungs.    . hydrOXYzine (ATARAX/VISTARIL) 25 MG tablet TAKE 1/2 TO 1 TABLET BY MOUTH TWICE DAILY AS NEEDED FOR ITCHING    . linaclotide (LINZESS) 145 MCG CAPS capsule Take 145 mcg by mouth.    . loratadine-pseudoephedrine  (CLARITIN-D 24-HOUR) 10-240 MG 24 hr tablet Take 1 tablet by mouth daily.    . megestrol (MEGACE) 40 MG tablet TAKE ONE TABLET EVERY DAY 30 tablet 2  . Multiple Vitamin (MULTIVITAMIN) capsule Take 1 capsule by mouth daily.    . Oxycodone HCl 10 MG TABS Take 1-2 tablets (10-20 mg total) by mouth every 6 (six) hours as needed. 40 tablet 0  . Probiotic Product (PROBIOTIC DAILY) CAPS Take 1 capsule by mouth daily.    . prochlorperazine (COMPAZINE) 10 MG tablet TAKE ONE TABLET BY MOUTH EVERY 6 HOURS AS NEEDED FOR NAUSEA / VOMITING 30 tablet 1  . sucralfate (CARAFATE) 1 g tablet Take 1 tablet (1 g total) by mouth 2 (two) times daily. Dissolve in 2-3 tbsp warm water, swish and swallow 60 tablet 3   No current facility-administered medications for this visit.    Facility-Administered Medications Ordered in Other Visits  Medication Dose Route Frequency Provider Last Rate Last Dose  . 0.9 %  sodium chloride infusion  250 mL Intravenous PRN Teodoro Spray, MD      . 0.9 %  sodium chloride infusion  250 mL Intravenous PRN Teodoro Spray, MD      . 0.9% sodium chloride infusion  1 mL/kg/hr Intravenous Continuous Teodoro Spray, MD      . 0.9% sodium chloride infusion  1 mL/kg/hr Intravenous Continuous Teodoro Spray, MD      . sodium chloride flush (NS) 0.9 % injection 3 mL  3 mL Intravenous Q12H Teodoro Spray, MD      . sodium chloride flush (NS) 0.9 % injection 3 mL  3 mL Intravenous PRN Teodoro Spray, MD      . sodium chloride flush (NS) 0.9 % injection 3 mL  3 mL Intravenous Q12H Teodoro Spray, MD      . sodium chloride flush (NS) 0.9 % injection 3 mL  3 mL Intravenous PRN Teodoro Spray, MD        OBJECTIVE: Vitals:   03/20/17 1045  BP: 135/80  Pulse: (!) 105  Resp: 20  Temp: 97.7 F (36.5 C)     Body mass index is 20.79 kg/m.    ECOG FS:1 - Symptomatic but completely ambulatory  General: Well-developed, well-nourished, no acute distress. Eyes: Pink conjunctiva, anicteric  sclera. Lungs: Clear to auscultation bilaterally. Heart: Regular rate and rhythm. No rubs, murmurs, or gallops. Abdomen: Soft, nontender, nondistended. No organomegaly noted, normoactive bowel sounds. Musculoskeletal: No edema, cyanosis, or clubbing. Neuro: Alert, answering all questions appropriately. Cranial nerves grossly intact. Bilateral tremor noted, right worse than left. Skin: Mild maculopapular rash noted on upper extremities. Psych: Normal affect.   LAB RESULTS:  Lab Results  Component Value Date   NA 139 03/20/2017   K 4.4 03/20/2017   CL 110 03/20/2017   CO2 21 (L) 03/20/2017   GLUCOSE 83 03/20/2017  BUN 28 (H) 03/20/2017   CREATININE 1.12 03/20/2017   CALCIUM 8.8 (L) 03/20/2017   PROT 7.4 03/20/2017   ALBUMIN 3.8 03/20/2017   AST 24 03/20/2017   ALT 18 03/20/2017   ALKPHOS 44 03/20/2017   BILITOT 0.5 03/20/2017   GFRNONAA 58 (L) 03/20/2017   GFRAA >60 03/20/2017    Lab Results  Component Value Date   WBC 5.8 03/20/2017   NEUTROABS 4.2 03/20/2017   HGB 9.4 (L) 03/20/2017   HCT 26.8 (L) 03/20/2017   MCV 110.3 (H) 03/20/2017   PLT 136 (L) 03/20/2017     STUDIES: Mr 3d Recon At Scanner  Result Date: 03/04/2017 CLINICAL DATA:  83 year old with low-density pancreatic head lesion on CT. Lung cancer diagnosed in December 2017. EXAM: MRI ABDOMEN WITHOUT AND WITH CONTRAST (INCLUDING MRCP) TECHNIQUE: Multiplanar multisequence MR imaging of the abdomen was performed both before and after the administration of intravenous contrast. Heavily T2-weighted images of the biliary and pancreatic ducts were obtained, and three-dimensional MRCP images were rendered by post processing. CONTRAST:  78mL MULTIHANCE GADOBENATE DIMEGLUMINE 529 MG/ML IV SOLN COMPARISON:  PET-CT 09/11/2016 and 01/16/2017. Abdominal CT 08/26/2016. Abdominal MRI 02/09/2016. FINDINGS: Despite efforts by the technologist and patient, mild motion artifact is present on today's exam and could not be  eliminated. This reduces exam sensitivity and specificity. Lower chest: No significant findings are seen within the visualized lower chest. Hepatobiliary: The liver appears unremarkable without focal lesion or abnormal enhancement. No evidence of gallstones, gallbladder wall thickening or biliary dilatation. Pancreas: A mildly septated cystic mass within the uncinate process does not appear significantly changed from the previous MRI, measuring approximately 2.6 x 1.4 x 1.9 cm. This lesion demonstrates no solid components or worrisome enhancement following contrast. This lesion was not hypermetabolic on previous PET CTs. There was focal hypermetabolism more distally in the pancreatic body and tail on the original PET-CT without corresponding abnormality on this study. There is no pancreatic ductal dilatation or surrounding inflammation. Spleen: Normal in size without focal abnormality. Adrenals/Urinary Tract: Both adrenal glands appear normal. Multiple small predominately Bosniak 1 renal cysts are again noted bilaterally. No worrisome renal lesion or hydronephrosis. Stomach/Bowel: No evidence of bowel wall thickening, distention or surrounding inflammatory change.Moderate size hiatal hernia again noted. Vascular/Lymphatic: There are no enlarged abdominal lymph nodes. There is diffuse atherosclerosis, better seen on CT. No focal aneurysm or evidence of large vessel occlusion. Other: No ascites or peritoneal nodularity. Musculoskeletal: T12 compression fracture results in greater than 50% loss vertebral body height mild osseous retropulsion, grossly stable from most recent PET CT. No worrisome osseous lesions are seen. IMPRESSION: 1. Mildly septated cystic mass within the uncinate process of the pancreas is not significantly changed in size from baseline MRI 13 months ago. This lesion was not hypermetabolic on PET-CT, and demonstrates no worrisome features. 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  Given the patient's advanced age and comorbidities, specific follow-up of this lesion is probably not necessary. Its stability can be addressed during follow-up imaging of the patient's lung cancer. 2. No metastases or acute findings demonstrated. 3. Grossly stable T12 compression fracture compared with recent PET-CT. 4. Bilateral renal cysts. Electronically Signed   By: Richardean Sale M.D.   On: 03/04/2017 13:41   Mr Abdomen Mrcp Moise Boring Contast  Result Date: 03/04/2017 CLINICAL DATA:  83 year old with low-density pancreatic head lesion on CT. Lung cancer diagnosed in December 2017. EXAM: MRI ABDOMEN WITHOUT AND WITH CONTRAST (INCLUDING MRCP) TECHNIQUE: Multiplanar multisequence MR  imaging of the abdomen was performed both before and after the administration of intravenous contrast. Heavily T2-weighted images of the biliary and pancreatic ducts were obtained, and three-dimensional MRCP images were rendered by post processing. CONTRAST:  30mL MULTIHANCE GADOBENATE DIMEGLUMINE 529 MG/ML IV SOLN COMPARISON:  PET-CT 09/11/2016 and 01/16/2017. Abdominal CT 08/26/2016. Abdominal MRI 02/09/2016. FINDINGS: Despite efforts by the technologist and patient, mild motion artifact is present on today's exam and could not be eliminated. This reduces exam sensitivity and specificity. Lower chest: No significant findings are seen within the visualized lower chest. Hepatobiliary: The liver appears unremarkable without focal lesion or abnormal enhancement. No evidence of gallstones, gallbladder wall thickening or biliary dilatation. Pancreas: A mildly septated cystic mass within the uncinate process does not appear significantly changed from the previous MRI, measuring approximately 2.6 x 1.4 x 1.9 cm. This lesion demonstrates no solid components or worrisome enhancement following contrast. This lesion was not hypermetabolic on previous PET CTs. There was focal hypermetabolism more distally in the pancreatic body and tail on the  original PET-CT without corresponding abnormality on this study. There is no pancreatic ductal dilatation or surrounding inflammation. Spleen: Normal in size without focal abnormality. Adrenals/Urinary Tract: Both adrenal glands appear normal. Multiple small predominately Bosniak 1 renal cysts are again noted bilaterally. No worrisome renal lesion or hydronephrosis. Stomach/Bowel: No evidence of bowel wall thickening, distention or surrounding inflammatory change.Moderate size hiatal hernia again noted. Vascular/Lymphatic: There are no enlarged abdominal lymph nodes. There is diffuse atherosclerosis, better seen on CT. No focal aneurysm or evidence of large vessel occlusion. Other: No ascites or peritoneal nodularity. Musculoskeletal: T12 compression fracture results in greater than 50% loss vertebral body height mild osseous retropulsion, grossly stable from most recent PET CT. No worrisome osseous lesions are seen. IMPRESSION: 1. Mildly septated cystic mass within the uncinate process of the pancreas is not significantly changed in size from baseline MRI 13 months ago. This lesion was not hypermetabolic on PET-CT, and demonstrates no worrisome features. 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 Given the patient's advanced age and comorbidities, specific follow-up of this lesion is probably not necessary. Its stability can be addressed during follow-up imaging of the patient's lung cancer. 2. No metastases or acute findings demonstrated. 3. Grossly stable T12 compression fracture compared with recent PET-CT. 4. Bilateral renal cysts. Electronically Signed   By: Richardean Sale M.D.   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  PLAN:    1. 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 MRI the brain also reviewed independently revealing metastatic disease.  PET scan results from Jan 16, 2017 reviewed independently with dramatic response to therapy with near complete resolution of metabolic activity. No further chemotherapy is needed at this time. But given his bony metastasis, we will continue Zometa every 4 weeks. Given patient's new onset had pain and tremor, will repeat MRI brain as well as PET scan to assess for progression of disease. Return to clinic in 4 weeks for continuation of Zometa unless his imaging is positive. He continues to express interest in continued quality of life over quantity of life.   2. Hypertension: Patient's blood pressure is within normal limits today, monitor. 3. Pain: Continue fentanyl patch to 50 g every 72 hours and 1-2 tabs of oxycodone as needed. 4. Brain metastasis: Treatment per radiation oncology. Patient has completed  palliative XRT. Repeat MRI as above. 5. Shortness of breath: Improved. Patient no longer requires home oxygen. 6. Constipation: Patient has been instructed to use stool softeners daily and magnesium citrate as needed. 7. Back pain: MRI of the thoracic spine on November 02, 2016 revealed likely pathologic compression fracture. He has completed palliative XRT. 8. Anemia: Likely contributing to patient's weakness and fatigue. Improved with recent transfusion. 9. Thrombocytopenia: Mild, monitor.   Patient expressed understanding and was in agreement with this plan. He also understands that He can call clinic at any time with any questions, concerns, or complaints.   Cancer Staging Small cell lung cancer, left [facility]) Staging form: Lung, AJCC 7th Edition - Clinical stage from 09/25/2016: Stage IV (T2, N2, M1b) - Signed by Lloyd Huger, MD on 09/25/2016   Lloyd Huger, MD   03/20/2017 4:45 PM

## 2017-03-20 ENCOUNTER — Inpatient Hospital Stay: Payer: Medicare HMO

## 2017-03-20 ENCOUNTER — Inpatient Hospital Stay: Payer: Medicare HMO | Attending: Oncology | Admitting: Oncology

## 2017-03-20 VITALS — BP 135/80 | HR 105 | Temp 97.7°F | Resp 20 | Wt 153.3 lb

## 2017-03-20 DIAGNOSIS — N281 Cyst of kidney, acquired: Secondary | ICD-10-CM | POA: Insufficient documentation

## 2017-03-20 DIAGNOSIS — Z79899 Other long term (current) drug therapy: Secondary | ICD-10-CM | POA: Diagnosis not present

## 2017-03-20 DIAGNOSIS — Z87891 Personal history of nicotine dependence: Secondary | ICD-10-CM | POA: Diagnosis not present

## 2017-03-20 DIAGNOSIS — D649 Anemia, unspecified: Secondary | ICD-10-CM | POA: Diagnosis not present

## 2017-03-20 DIAGNOSIS — C7951 Secondary malignant neoplasm of bone: Secondary | ICD-10-CM | POA: Diagnosis not present

## 2017-03-20 DIAGNOSIS — C3492 Malignant neoplasm of unspecified part of left bronchus or lung: Secondary | ICD-10-CM | POA: Insufficient documentation

## 2017-03-20 DIAGNOSIS — K5900 Constipation, unspecified: Secondary | ICD-10-CM

## 2017-03-20 DIAGNOSIS — E785 Hyperlipidemia, unspecified: Secondary | ICD-10-CM | POA: Insufficient documentation

## 2017-03-20 DIAGNOSIS — Z7982 Long term (current) use of aspirin: Secondary | ICD-10-CM | POA: Diagnosis not present

## 2017-03-20 DIAGNOSIS — D696 Thrombocytopenia, unspecified: Secondary | ICD-10-CM | POA: Diagnosis not present

## 2017-03-20 DIAGNOSIS — N400 Enlarged prostate without lower urinary tract symptoms: Secondary | ICD-10-CM | POA: Insufficient documentation

## 2017-03-20 DIAGNOSIS — C7931 Secondary malignant neoplasm of brain: Secondary | ICD-10-CM | POA: Diagnosis not present

## 2017-03-20 DIAGNOSIS — K449 Diaphragmatic hernia without obstruction or gangrene: Secondary | ICD-10-CM | POA: Diagnosis not present

## 2017-03-20 LAB — COMPREHENSIVE METABOLIC PANEL
ALBUMIN: 3.8 g/dL (ref 3.5–5.0)
ALK PHOS: 44 U/L (ref 38–126)
ALT: 18 U/L (ref 17–63)
AST: 24 U/L (ref 15–41)
Anion gap: 8 (ref 5–15)
BUN: 28 mg/dL — AB (ref 6–20)
CALCIUM: 8.8 mg/dL — AB (ref 8.9–10.3)
CHLORIDE: 110 mmol/L (ref 101–111)
CO2: 21 mmol/L — AB (ref 22–32)
CREATININE: 1.12 mg/dL (ref 0.61–1.24)
GFR calc Af Amer: >60 mL/min (ref >60)
GFR calc non Af Amer: 58 mL/min — ABNORMAL LOW (ref >60)
GLUCOSE: 83 mg/dL (ref 65–99)
Potassium: 4.4 mmol/L (ref 3.5–5.1)
SODIUM: 139 mmol/L (ref 135–145)
Total Bilirubin: 0.5 mg/dL (ref 0.3–1.2)
Total Protein: 7.4 g/dL (ref 6.5–8.1)

## 2017-03-20 LAB — CBC WITH DIFFERENTIAL/PLATELET
BASOS ABS: 0 10*3/uL (ref 0–0.1)
Basophils Relative: 0 %
EOS ABS: 0.1 10*3/uL (ref 0–0.7)
Eosinophils Relative: 1 %
HCT: 26.8 % — ABNORMAL LOW (ref 40.0–52.0)
HEMOGLOBIN: 9.4 g/dL — AB (ref 13.0–18.0)
LYMPHS ABS: 1 10*3/uL (ref 1.0–3.6)
Lymphocytes Relative: 17 %
MCH: 38.8 pg — AB (ref 26.0–34.0)
MCHC: 35.2 g/dL (ref 32.0–36.0)
MCV: 110.3 fL — ABNORMAL HIGH (ref 80.0–100.0)
Monocytes Absolute: 0.5 10*3/uL (ref 0.2–1.0)
Monocytes Relative: 9 %
NEUTROS PCT: 73 %
Neutro Abs: 4.2 10*3/uL (ref 1.4–6.5)
PLATELETS: 136 10*3/uL — AB (ref 150–440)
RBC: 2.43 MIL/uL — AB (ref 4.40–5.90)
RDW: 12.9 % (ref 11.5–14.5)
WBC: 5.8 10*3/uL (ref 3.8–10.6)

## 2017-03-20 MED ORDER — SODIUM CHLORIDE 0.9 % IV SOLN
Freq: Once | INTRAVENOUS | Status: AC
  Administered 2017-03-20: 12:00:00 via INTRAVENOUS
  Filled 2017-03-20: qty 1000

## 2017-03-20 MED ORDER — ZOLEDRONIC ACID 4 MG/5ML IV CONC
3.3000 mg | Freq: Once | INTRAVENOUS | Status: AC
  Administered 2017-03-20: 3.3 mg via INTRAVENOUS
  Filled 2017-03-20: qty 4.13

## 2017-03-20 NOTE — Progress Notes (Signed)
Patient reports new pain today to head and throat, states the pain is intermittent. Patient reports pain started 3 weeks ago.

## 2017-03-28 ENCOUNTER — Encounter: Admission: RE | Admit: 2017-03-28 | Payer: Medicare HMO | Source: Ambulatory Visit

## 2017-03-28 ENCOUNTER — Emergency Department: Payer: Medicare HMO

## 2017-03-28 ENCOUNTER — Inpatient Hospital Stay
Admission: EM | Admit: 2017-03-28 | Discharge: 2017-04-01 | DRG: 312 | Disposition: A | Discharge status: Home / Self Care | Payer: Medicare HMO | Attending: Internal Medicine | Admitting: Internal Medicine

## 2017-03-28 ENCOUNTER — Ambulatory Visit: Payer: Medicare HMO

## 2017-03-28 ENCOUNTER — Observation Stay: Payer: Medicare HMO

## 2017-03-28 ENCOUNTER — Other Ambulatory Visit: Payer: Self-pay

## 2017-03-28 DIAGNOSIS — C3492 Malignant neoplasm of unspecified part of left bronchus or lung: Secondary | ICD-10-CM | POA: Diagnosis not present

## 2017-03-28 DIAGNOSIS — R42 Dizziness and giddiness: Secondary | ICD-10-CM

## 2017-03-28 DIAGNOSIS — I10 Essential (primary) hypertension: Secondary | ICD-10-CM | POA: Diagnosis present

## 2017-03-28 DIAGNOSIS — M720 Palmar fascial fibromatosis [Dupuytren]: Secondary | ICD-10-CM | POA: Diagnosis present

## 2017-03-28 DIAGNOSIS — D649 Anemia, unspecified: Secondary | ICD-10-CM | POA: Diagnosis present

## 2017-03-28 DIAGNOSIS — R531 Weakness: Secondary | ICD-10-CM | POA: Diagnosis not present

## 2017-03-28 DIAGNOSIS — C799 Secondary malignant neoplasm of unspecified site: Secondary | ICD-10-CM

## 2017-03-28 DIAGNOSIS — Z7189 Other specified counseling: Secondary | ICD-10-CM

## 2017-03-28 DIAGNOSIS — C7931 Secondary malignant neoplasm of brain: Secondary | ICD-10-CM | POA: Diagnosis present

## 2017-03-28 DIAGNOSIS — R5383 Other fatigue: Secondary | ICD-10-CM | POA: Diagnosis not present

## 2017-03-28 DIAGNOSIS — C7951 Secondary malignant neoplasm of bone: Secondary | ICD-10-CM | POA: Diagnosis not present

## 2017-03-28 DIAGNOSIS — Z8619 Personal history of other infectious and parasitic diseases: Secondary | ICD-10-CM | POA: Diagnosis not present

## 2017-03-28 DIAGNOSIS — Z515 Encounter for palliative care: Secondary | ICD-10-CM

## 2017-03-28 DIAGNOSIS — Z87891 Personal history of nicotine dependence: Secondary | ICD-10-CM

## 2017-03-28 DIAGNOSIS — Z8673 Personal history of transient ischemic attack (TIA), and cerebral infarction without residual deficits: Secondary | ICD-10-CM

## 2017-03-28 DIAGNOSIS — G8929 Other chronic pain: Secondary | ICD-10-CM | POA: Diagnosis present

## 2017-03-28 DIAGNOSIS — E785 Hyperlipidemia, unspecified: Secondary | ICD-10-CM | POA: Diagnosis present

## 2017-03-28 DIAGNOSIS — Z923 Personal history of irradiation: Secondary | ICD-10-CM

## 2017-03-28 DIAGNOSIS — Z801 Family history of malignant neoplasm of trachea, bronchus and lung: Secondary | ICD-10-CM

## 2017-03-28 DIAGNOSIS — Z7951 Long term (current) use of inhaled steroids: Secondary | ICD-10-CM

## 2017-03-28 DIAGNOSIS — Z9221 Personal history of antineoplastic chemotherapy: Secondary | ICD-10-CM

## 2017-03-28 DIAGNOSIS — K449 Diaphragmatic hernia without obstruction or gangrene: Secondary | ICD-10-CM | POA: Diagnosis not present

## 2017-03-28 DIAGNOSIS — Z808 Family history of malignant neoplasm of other organs or systems: Secondary | ICD-10-CM

## 2017-03-28 DIAGNOSIS — Z79899 Other long term (current) drug therapy: Secondary | ICD-10-CM | POA: Diagnosis not present

## 2017-03-28 DIAGNOSIS — N400 Enlarged prostate without lower urinary tract symptoms: Secondary | ICD-10-CM

## 2017-03-28 DIAGNOSIS — E860 Dehydration: Secondary | ICD-10-CM | POA: Diagnosis present

## 2017-03-28 DIAGNOSIS — B370 Candidal stomatitis: Secondary | ICD-10-CM | POA: Diagnosis present

## 2017-03-28 DIAGNOSIS — Z85118 Personal history of other malignant neoplasm of bronchus and lung: Secondary | ICD-10-CM

## 2017-03-28 DIAGNOSIS — Z800 Family history of malignant neoplasm of digestive organs: Secondary | ICD-10-CM

## 2017-03-28 DIAGNOSIS — Z79891 Long term (current) use of opiate analgesic: Secondary | ICD-10-CM

## 2017-03-28 DIAGNOSIS — C3490 Malignant neoplasm of unspecified part of unspecified bronchus or lung: Secondary | ICD-10-CM

## 2017-03-28 DIAGNOSIS — R338 Other retention of urine: Secondary | ICD-10-CM | POA: Diagnosis present

## 2017-03-28 DIAGNOSIS — R55 Syncope and collapse: Secondary | ICD-10-CM

## 2017-03-28 DIAGNOSIS — Z7982 Long term (current) use of aspirin: Secondary | ICD-10-CM

## 2017-03-28 DIAGNOSIS — N401 Enlarged prostate with lower urinary tract symptoms: Secondary | ICD-10-CM | POA: Diagnosis present

## 2017-03-28 DIAGNOSIS — Z66 Do not resuscitate: Secondary | ICD-10-CM | POA: Diagnosis present

## 2017-03-28 DIAGNOSIS — I951 Orthostatic hypotension: Principal | ICD-10-CM | POA: Diagnosis present

## 2017-03-28 DIAGNOSIS — K5790 Diverticulosis of intestine, part unspecified, without perforation or abscess without bleeding: Secondary | ICD-10-CM | POA: Diagnosis present

## 2017-03-28 DIAGNOSIS — Z8719 Personal history of other diseases of the digestive system: Secondary | ICD-10-CM

## 2017-03-28 DIAGNOSIS — L299 Pruritus, unspecified: Secondary | ICD-10-CM | POA: Diagnosis present

## 2017-03-28 LAB — CBC WITH DIFFERENTIAL/PLATELET
Basophils Absolute: 0 10*3/uL (ref 0–0.1)
Basophils Relative: 0 %
EOS ABS: 0.1 10*3/uL (ref 0–0.7)
Eosinophils Relative: 1 %
HCT: 27.9 % — ABNORMAL LOW (ref 40.0–52.0)
HEMOGLOBIN: 9.6 g/dL — AB (ref 13.0–18.0)
LYMPHS ABS: 0.7 10*3/uL — AB (ref 1.0–3.6)
Lymphocytes Relative: 10 %
MCH: 37.6 pg — AB (ref 26.0–34.0)
MCHC: 34.2 g/dL (ref 32.0–36.0)
MCV: 109.8 fL — ABNORMAL HIGH (ref 80.0–100.0)
MONOS PCT: 4 %
Monocytes Absolute: 0.3 10*3/uL (ref 0.2–1.0)
NEUTROS ABS: 6.2 10*3/uL (ref 1.4–6.5)
NEUTROS PCT: 85 %
Platelets: 165 10*3/uL (ref 150–440)
RBC: 2.54 MIL/uL — ABNORMAL LOW (ref 4.40–5.90)
RDW: 12.3 % (ref 11.5–14.5)
WBC: 7.3 10*3/uL (ref 3.8–10.6)

## 2017-03-28 LAB — COMPREHENSIVE METABOLIC PANEL
ALK PHOS: 37 U/L — AB (ref 38–126)
ALT: 22 U/L (ref 17–63)
ANION GAP: 9 (ref 5–15)
AST: 37 U/L (ref 15–41)
Albumin: 3.3 g/dL — ABNORMAL LOW (ref 3.5–5.0)
BILIRUBIN TOTAL: 0.8 mg/dL (ref 0.3–1.2)
BUN: 26 mg/dL — ABNORMAL HIGH (ref 6–20)
CALCIUM: 8.4 mg/dL — AB (ref 8.9–10.3)
CO2: 18 mmol/L — ABNORMAL LOW (ref 22–32)
Chloride: 110 mmol/L (ref 101–111)
Creatinine, Ser: 1.19 mg/dL (ref 0.61–1.24)
GFR calc Af Amer: >60 mL/min (ref >60)
GFR calc non Af Amer: 54 mL/min — ABNORMAL LOW (ref >60)
Glucose, Bld: 122 mg/dL — ABNORMAL HIGH (ref 65–99)
Potassium: 4.4 mmol/L (ref 3.5–5.1)
Sodium: 137 mmol/L (ref 135–145)
Total Protein: 6.5 g/dL (ref 6.5–8.1)

## 2017-03-28 LAB — URINALYSIS, COMPLETE (UACMP) WITH MICROSCOPIC
BILIRUBIN URINE: NEGATIVE
Bacteria, UA: NONE SEEN
Glucose, UA: NEGATIVE mg/dL
Ketones, ur: 5 mg/dL — AB
LEUKOCYTES UA: NEGATIVE
Nitrite: NEGATIVE
Protein, ur: NEGATIVE mg/dL
SPECIFIC GRAVITY, URINE: 1.018 (ref 1.005–1.030)
SQUAMOUS EPITHELIAL / LPF: NONE SEEN
pH: 6 (ref 5.0–8.0)

## 2017-03-28 LAB — PROTIME-INR
INR: 1.04
Prothrombin Time: 13.6 seconds (ref 11.4–15.2)

## 2017-03-28 LAB — GLUCOSE, CAPILLARY: Glucose-Capillary: 95 mg/dL (ref 65–99)

## 2017-03-28 LAB — MAGNESIUM
MAGNESIUM: 2.4 mg/dL (ref 1.7–2.4)
Magnesium: 2.5 mg/dL — ABNORMAL HIGH (ref 1.7–2.4)

## 2017-03-28 LAB — PHOSPHORUS: Phosphorus: 3 mg/dL (ref 2.5–4.6)

## 2017-03-28 LAB — TROPONIN I: Troponin I: <0.03 ng/mL (ref <0.03)

## 2017-03-28 LAB — APTT: aPTT: 27 seconds (ref 24–36)

## 2017-03-28 MED ORDER — SODIUM CHLORIDE 0.9 % IV SOLN
INTRAVENOUS | Status: DC
  Administered 2017-03-28 – 2017-03-30 (×4): via INTRAVENOUS

## 2017-03-28 MED ORDER — LORAZEPAM 1 MG PO TABS: 1.0000 mg | ORAL_TABLET | Freq: Once | ORAL | Status: DC | PRN

## 2017-03-28 MED ORDER — ACETAMINOPHEN 325 MG PO TABS: 650.0000 mg | ORAL_TABLET | Freq: Four times a day (QID) | ORAL | Status: DC | PRN

## 2017-03-28 MED ORDER — SODIUM CHLORIDE 0.9 % IV SOLN
1000.0000 mL | Freq: Once | INTRAVENOUS | Status: AC
  Administered 2017-03-28: 1000 mL via INTRAVENOUS

## 2017-03-28 MED ORDER — ALBUTEROL SULFATE (2.5 MG/3ML) 0.083% IN NEBU: 2.5000 mg | INHALATION_SOLUTION | RESPIRATORY_TRACT | Status: DC | PRN

## 2017-03-28 MED ORDER — ONDANSETRON HCL 4 MG PO TABS
4.0000 mg | ORAL_TABLET | Freq: Four times a day (QID) | ORAL | Status: DC | PRN
Start: 2017-03-28 — End: 2017-04-01

## 2017-03-28 MED ORDER — DOCUSATE SODIUM 100 MG PO CAPS
100.0000 mg | ORAL_CAPSULE | Freq: Two times a day (BID) | ORAL | Status: DC | PRN
  Administered 2017-03-29 – 2017-04-01 (×3): 100 mg via ORAL
  Filled 2017-03-28 (×3): qty 1

## 2017-03-28 MED ORDER — ALPRAZOLAM 0.5 MG PO TABS
0.5000 mg | ORAL_TABLET | Freq: Every day | ORAL | Status: DC | PRN
  Administered 2017-03-28 – 2017-03-30 (×2): 0.5 mg via ORAL
  Filled 2017-03-28 (×2): qty 1

## 2017-03-28 MED ORDER — GADOBENATE DIMEGLUMINE 529 MG/ML IV SOLN
15.0000 mL | Freq: Once | INTRAVENOUS | Status: AC | PRN
  Administered 2017-03-28: 15 mL via INTRAVENOUS

## 2017-03-28 MED ORDER — ASPIRIN EC 81 MG PO TBEC
81.0000 mg | DELAYED_RELEASE_TABLET | Freq: Every day | ORAL | Status: DC
  Administered 2017-03-29 – 2017-03-31 (×3): 81 mg via ORAL
  Filled 2017-03-28 (×3): qty 1

## 2017-03-28 MED ORDER — ONDANSETRON HCL 4 MG/2ML IJ SOLN: 4.0000 mg | Freq: Four times a day (QID) | INTRAMUSCULAR | Status: DC | PRN

## 2017-03-28 MED ORDER — LORATADINE-PSEUDOEPHEDRINE ER 10-240 MG PO TB24: 1.0000 | ORAL_TABLET | Freq: Every day | ORAL | Status: DC

## 2017-03-28 MED ORDER — RISAQUAD PO CAPS
1.0000 | ORAL_CAPSULE | Freq: Every day | ORAL | Status: DC
  Administered 2017-03-29 – 2017-04-01 (×4): 1 via ORAL
  Filled 2017-03-28 (×4): qty 1

## 2017-03-28 MED ORDER — SENNOSIDES-DOCUSATE SODIUM 8.6-50 MG PO TABS
1.0000 | ORAL_TABLET | Freq: Every evening | ORAL | Status: DC | PRN
  Administered 2017-03-30: 21:00:00 1 via ORAL
  Filled 2017-03-28: qty 1

## 2017-03-28 MED ORDER — ACETAMINOPHEN 650 MG RE SUPP: 650.0000 mg | Freq: Four times a day (QID) | RECTAL | Status: DC | PRN

## 2017-03-28 MED ORDER — LINACLOTIDE 145 MCG PO CAPS
145.0000 ug | ORAL_CAPSULE | Freq: Every day | ORAL | Status: DC
  Administered 2017-03-29 – 2017-04-01 (×4): 145 ug via ORAL
  Filled 2017-03-28 (×4): qty 1

## 2017-03-28 MED ORDER — MEGESTROL ACETATE 40 MG PO TABS
40.0000 mg | ORAL_TABLET | Freq: Every day | ORAL | Status: DC
  Administered 2017-03-29 – 2017-04-01 (×4): 40 mg via ORAL
  Filled 2017-03-28 (×5): qty 1

## 2017-03-28 MED ORDER — DOXEPIN HCL 10 MG PO CAPS
10.0000 mg | ORAL_CAPSULE | Freq: Every day | ORAL | Status: DC
  Administered 2017-03-28 – 2017-03-31 (×4): 10 mg via ORAL
  Filled 2017-03-28 (×5): qty 1

## 2017-03-28 MED ORDER — OXYCODONE HCL 5 MG PO TABS
10.0000 mg | ORAL_TABLET | Freq: Four times a day (QID) | ORAL | Status: DC | PRN
Start: 2017-03-28 — End: 2017-04-01
  Administered 2017-03-30: 10 mg via ORAL
  Filled 2017-03-28: qty 2

## 2017-03-28 MED ORDER — MOMETASONE FURO-FORMOTEROL FUM 200-5 MCG/ACT IN AERO
2.0000 | INHALATION_SPRAY | Freq: Two times a day (BID) | RESPIRATORY_TRACT | Status: DC
  Administered 2017-03-28 – 2017-04-01 (×8): 2 via RESPIRATORY_TRACT
  Filled 2017-03-28: qty 8.8

## 2017-03-28 MED ORDER — HYDROXYZINE HCL 25 MG PO TABS
25.0000 mg | ORAL_TABLET | Freq: Three times a day (TID) | ORAL | Status: DC | PRN
  Administered 2017-03-29 – 2017-04-01 (×5): 25 mg via ORAL
  Filled 2017-03-28 (×6): qty 1

## 2017-03-28 MED ORDER — DIPHENHYDRAMINE HCL 25 MG PO CAPS: 25.0000 mg | ORAL_CAPSULE | Freq: Four times a day (QID) | ORAL | Status: DC | PRN

## 2017-03-28 MED ORDER — SUCRALFATE 1 G PO TABS
1.0000 g | ORAL_TABLET | Freq: Two times a day (BID) | ORAL | Status: DC
Start: 2017-03-28 — End: 2017-04-01
  Administered 2017-03-28 – 2017-04-01 (×8): 1 g via ORAL
  Filled 2017-03-28 (×8): qty 1

## 2017-03-28 MED ORDER — DOCUSATE SODIUM 100 MG PO CAPS: 100.0000 mg | ORAL_CAPSULE | Freq: Two times a day (BID) | ORAL | Status: DC

## 2017-03-28 MED ORDER — LORATADINE 10 MG PO TABS: 10.0000 mg | ORAL_TABLET | Freq: Every day | ORAL | Status: DC | PRN

## 2017-03-28 MED ORDER — ENOXAPARIN SODIUM 40 MG/0.4ML ~~LOC~~ SOLN
40.0000 mg | SUBCUTANEOUS | Status: DC
  Administered 2017-03-28 – 2017-03-31 (×4): 40 mg via SUBCUTANEOUS
  Filled 2017-03-28 (×4): qty 0.4

## 2017-03-28 MED ORDER — SODIUM CHLORIDE 0.9 % IV SOLN
1000.0000 mL | Freq: Once | INTRAVENOUS | Status: AC
Start: 2017-03-28 — End: 2017-03-28
  Administered 2017-03-28: 1000 mL via INTRAVENOUS

## 2017-03-28 MED ORDER — PSEUDOEPHEDRINE HCL ER 120 MG PO TB12
120.0000 mg | ORAL_TABLET | Freq: Two times a day (BID) | ORAL | Status: DC | PRN
  Filled 2017-03-28: qty 1

## 2017-03-28 NOTE — H&P (Addendum)
[facility] [place] NAME: Noah Coleman    MR#:  [id number]  DATE OF BIRTH:  03/29/1930  DATE OF ADMISSION:  03/28/2017  PRIMARY CARE PHYSICIAN: Rusty Aus, MD   REQUESTING/REFERRING PHYSICIAN: Lavonia Drafts, MD  CHIEF COMPLAINT:   Chief Complaint  Patient presents with  . Fall  . Loss of Consciousness   Dizziness and a fall this morning. HISTORY OF PRESENT ILLNESS:  Noah Coleman  is a 83 y.o. male with a known history of Stage IV lung cancer, anemia, BPH, Chronic idiopathic urticaria and diverticulosis. The patient was sent to ED due to fall on the floor for 3 hours this morning. He was found orthostatic hypotension and dehydration, given normal saline IV. But the patient became unresponsive during measurement of orthostatic vital sign. Then he wakes up but denies any symptoms except for dizziness and weakness. He has had skin rash for 6 weeks.  PAST MEDICAL HISTORY:   Past Medical History:  Diagnosis Date  . Anemia   . BPH (benign prostatic hyperplasia)   . Chronic idiopathic urticaria   . Diverticulosis   . Dupuytren's contracture of right hand   . History of hiatal hernia   . Hyperlipemia   . Lung cancer ([place])   . Shingles     PAST SURGICAL HISTORY:   Past Surgical History:  Procedure Laterality Date  . CARDIAC CATHETERIZATION Left 08/12/2016   Procedure: Left Heart Cath and Coronary Angiography;  Surgeon: Teodoro Spray, MD;  Location: [facility];  Service: Cardiovascular;  Laterality: Left;  . COLONOSCOPY    . EUS N/A 02/22/2016   Procedure: UPPER ENDOSCOPIC ULTRASOUND (EUS) LINEAR;  Surgeon: Cora Daniels, MD;  Location[facility];  Service: Endoscopy;  Laterality: N/A;  . TONSILLECTOMY      SOCIAL HISTORY:   Social History  Substance Use Topics  . Smoking status: Former Smoker    Years: 30.00    Types: Cigarettes    Quit date: 08/12/1981  . Smokeless tobacco: Never Used  .  Alcohol use No    FAMILY HISTORY:   Family History  Problem Relation Age of Onset  . Cancer Mother        unknown type  . Lung cancer Father   . Colon cancer Father     DRUG ALLERGIES:  No Known Allergies  REVIEW OF SYSTEMS:   Review of Systems  Constitutional: Positive for malaise/fatigue. Negative for chills and fever.  HENT: Negative for sore throat.   Eyes: Negative for blurred vision and double vision.  Respiratory: Negative for cough, hemoptysis, sputum production, shortness of breath, wheezing and stridor.   Cardiovascular: Negative for chest pain, palpitations and leg swelling.  Gastrointestinal: Negative for abdominal pain, blood in stool, diarrhea, melena, nausea and vomiting.  Genitourinary: Negative for dysuria and hematuria.  Musculoskeletal: Negative for back pain.  Skin: Positive for itching and rash.  Neurological: Positive for dizziness and weakness. Negative for tingling, tremors, sensory change, speech change, focal weakness, seizures, loss of consciousness and headaches.  Psychiatric/Behavioral: Negative for depression. The patient is not nervous/anxious.     MEDICATIONS AT HOME:   Prior to Admission medications   Medication Sig Start Date End Date Taking? Authorizing Provider  aspirin EC 81 MG tablet Take 1 tablet by mouth 1 day or 1 dose.   Yes [provider]  doxepin (SINEQUAN) 10 MG capsule Take 10 mg by mouth at bedtime. 02/18/17 02/18/18 Yes [provider]  hydrOXYzine (ATARAX/VISTARIL) 25 MG tablet TAKE 1/2 TO 1 TABLET BY MOUTH TWICE DAILY AS NEEDED FOR ITCHING 02/17/17  Yes [provider]  linaclotide (LINZESS) 145 MCG CAPS capsule Take 145 mcg by mouth. 10/18/16  Yes [provider]  megestrol (MEGACE) 40 MG tablet TAKE ONE TABLET EVERY DAY 03/01/17  Yes Lloyd Huger, MD  Multiple Vitamin (MULTIVITAMIN) capsule Take 1 capsule by mouth daily.   Yes [provider]  Probiotic Product (PROBIOTIC DAILY)  CAPS Take 1 capsule by mouth daily.   Yes [provider]  triamcinolone cream (KENALOG) 0.1 % Apply 1 application topically 2 (two) times daily.   Yes [provider]  ALPRAZolam (XANAX) 0.5 MG tablet Take 1 tablet (0.5 mg total) by mouth daily. Take 1 hour before radiation 10/30/16   Noreene Filbert, MD  Cyanocobalamin (RA VITAMIN B-12 TR) 1000 MCG TBCR Take 1 tablet by mouth 1 day or 1 dose.    [provider]  diphenhydrAMINE (BENADRYL) 25 mg capsule Take 25 mg by mouth every 6 (six) hours as needed.    [provider]  docusate sodium (COLACE) 100 MG capsule Take 1 capsule (100 mg total) by mouth 2 (two) times daily. 10/14/16 10/14/17  Lavonia Drafts, MD  Fluticasone-Salmeterol (ADVAIR DISKUS) 250-50 MCG/DOSE AEPB Inhale 1 puff into the lungs 2 (two) times daily.  09/30/16 09/30/17  [provider]  loratadine-pseudoephedrine (CLARITIN-D 24-HOUR) 10-240 MG 24 hr tablet Take 1 tablet by mouth daily.    [provider]  Oxycodone HCl 10 MG TABS Take 1-2 tablets (10-20 mg total) by mouth every 6 (six) hours as needed. 01/21/17   Lloyd Huger, MD  prochlorperazine (COMPAZINE) 10 MG tablet TAKE ONE TABLET BY MOUTH EVERY 6 HOURS AS NEEDED FOR NAUSEA / VOMITING 02/28/17   Lloyd Huger, MD  sucralfate (CARAFATE) 1 g tablet Take 1 tablet (1 g total) by mouth 2 (two) times daily. Dissolve in 2-3 tbsp warm water, swish and swallow 11/21/16   Noreene Filbert, MD      VITAL SIGNS:  Blood pressure 135/71, pulse 96, temperature (!) 97 F (36.1 C), temperature source Rectal, resp. rate 19, weight 153 lb (69.4 kg), SpO2 93 %.  PHYSICAL EXAMINATION:  Physical Exam  GENERAL:  83 y.o.-year-old patient lying in the bed with no acute distress.  EYES: Pupils equal, round, reactive to light and accommodation. No scleral icterus. Extraocular muscles intact.  HEENT: Head atraumatic, normocephalic. Oropharynx and nasopharynx clear.  NECK:  Supple, no  jugular venous distention. No thyroid enlargement, no tenderness.  LUNGS: Normal breath sounds bilaterally, no wheezing, rales,rhonchi or crepitation. No use of accessory muscles of respiration.  CARDIOVASCULAR: S1, S2 normal. No murmurs, rubs, or gallops.  ABDOMEN: Soft, nontender, nondistended. Bowel sounds present. No organomegaly or mass.  EXTREMITIES: No pedal edema, cyanosis, or clubbing.  NEUROLOGIC: Cranial nerves II through XII are intact. Muscle strength 3/5 in right leg and 4/5 in other extremities. Sensation intact. Gait not checked.  PSYCHIATRIC: The patient is alert and oriented x 3.  SKIN: has rash, bruise and skin abrasion on arms.  LABORATORY PANEL:   CBC  Recent Labs Lab 03/28/17 0846  WBC 7.3  HGB 9.6*  HCT 27.9*  PLT 165   ------------------------------------------------------------------------------------------------------------------  Chemistries   Recent Labs Lab 03/28/17 0846  NA 137  K 4.4  CL 110  CO2 18*  GLUCOSE 122*  BUN 26*  CREATININE 1.19  CALCIUM 8.4*  MG 2.4  AST 37  ALT 22  ALKPHOS 37*  BILITOT 0.8   ------------------------------------------------------------------------------------------------------------------  Cardiac Enzymes  Recent Labs Lab 03/28/17 0846  TROPONINI <0.03   ------------------------------------------------------------------------------------------------------------------  RADIOLOGY:  Ct Head Wo Contrast  Result Date: 03/28/2017 CLINICAL DATA:  Fall in shower this morning. EXAM: CT HEAD WITHOUT CONTRAST CT CERVICAL SPINE WITHOUT CONTRAST TECHNIQUE: Multidetector CT imaging of the head and cervical spine was performed following the standard protocol without intravenous contrast. Multiplanar CT image reconstructions of the cervical spine were also generated. COMPARISON:  Brain MRI 10/07/2016. PET CT 01/16/2017. CTA neck 08/16/2016. FINDINGS: CT HEAD FINDINGS Brain: No acute intracranial abnormality.  Specifically, no hemorrhage, hydrocephalus, mass lesion, acute infarction, or significant intracranial injury. Mild cerebral atrophy. Vascular: No hyperdense vessel or unexpected calcification. Skull: No acute calvarial abnormality. Sinuses/Orbits: Visualized paranasal sinuses and mastoids clear. Orbital soft tissues unremarkable. Other: None CT CERVICAL SPINE FINDINGS Alignment: Normal Skull base and vertebrae: No fracture Soft tissues and spinal canal: Prevertebral soft tissues are normal. No epidural or paraspinal hematoma. Disc levels: Mild disc space narrowing and spurring C5-6 and C6-7. Mild bilateral degenerative facet disease. Upper chest: Rounded soft tissue area noted in the region of the left paraspinal soft tissues that CT to on image 76. This is stable since prior CTA performed 08/16/2016 and did not demonstrate increased uptake on prior PET CT from 01/16/2017. Therefore this most likely represents a benign process. There is underlying sclerosis and remodeling of the T2 posterior elements on the left. Other: None IMPRESSION: No acute intracranial abnormality. No acute bony abnormality in the cervical spine. Electronically Signed   By: Rolm Baptise M.D.   On: 03/28/2017 10:01   Ct Cervical Spine Wo Contrast  Result Date: 03/28/2017 CLINICAL DATA:  Fall in shower this morning. EXAM: CT HEAD WITHOUT CONTRAST CT CERVICAL SPINE WITHOUT CONTRAST TECHNIQUE: Multidetector CT imaging of the head and cervical spine was performed following the standard protocol without intravenous contrast. Multiplanar CT image reconstructions of the cervical spine were also generated. COMPARISON:  Brain MRI 10/07/2016. PET CT 01/16/2017. CTA neck 08/16/2016. FINDINGS: CT HEAD FINDINGS Brain: No acute intracranial abnormality. Specifically, no hemorrhage, hydrocephalus, mass lesion, acute infarction, or significant intracranial injury. Mild cerebral atrophy. Vascular: No hyperdense vessel or unexpected calcification. Skull: No  acute calvarial abnormality. Sinuses/Orbits: Visualized paranasal sinuses and mastoids clear. Orbital soft tissues unremarkable. Other: None CT CERVICAL SPINE FINDINGS Alignment: Normal Skull base and vertebrae: No fracture Soft tissues and spinal canal: Prevertebral soft tissues are normal. No epidural or paraspinal hematoma. Disc levels: Mild disc space narrowing and spurring C5-6 and C6-7. Mild bilateral degenerative facet disease. Upper chest: Rounded soft tissue area noted in the region of the left paraspinal soft tissues that CT to on image 76. This is stable since prior CTA performed 08/16/2016 and did not demonstrate increased uptake on prior PET CT from 01/16/2017. Therefore this most likely represents a benign process. There is underlying sclerosis and remodeling of the T2 posterior elements on the left. Other: None IMPRESSION: No acute intracranial abnormality. No acute bony abnormality in the cervical spine. Electronically Signed   By: Rolm Baptise M.D.   On: 03/28/2017 10:01   Dg Chest Portable 1 View  Result Date: 03/28/2017 CLINICAL DATA:  Weakness.  Syncope in the shower EXAM: PORTABLE CHEST 1 VIEW COMPARISON:  PET CT 01/16/2017 FINDINGS: Low volumes with coarse interstitial opacities from chronic lung disease/ fibrosis. Known subpleural opacity in the left upper lobe. Patient has history of treated lung cancer. Normal heart size and mediastinal  contours. There is no edema, consolidation, effusion, or pneumothorax. IMPRESSION: 1. No acute finding. 2. Pulmonary fibrosis and treated left lung cancer. Electronically Signed   By: Monte Fantasia M.D.   On: 03/28/2017 09:13      IMPRESSION AND PLAN:   Orthostatic hypotension due to dehydration The patient will be placed for observation. Repeat orthostatic vital sign, start normal saline IV and follow-up BMP.  Unresponsiveness, due to above. CAT scan of the head is unremarkable. The patient is supposed to get an MRI of the brain today.  Order MRI of the brain.  Generalized weakness. PT evaluation.  Stage IV lung cancer. Follow-up Dr. Grayland Ormond as outpatient.  All the records are reviewed and case discussed with ED provider. Management plans discussed with the patient,His niece POA and they are in agreement.  CODE STATUS: DO NOT RESUSCITATE.  TOTAL TIME TAKING CARE OF THIS PATIENT: 52 minutes.    Demetrios Loll M.D on 03/28/2017 at 1:09 PM  Between 7am to 6pm - Pager - [phone]  After 6pm go to [email or web address] - Proofreader  [facility]  Office  [phone]  CC: Primary care physician; Rusty Aus, MD   Note: This dictation was prepared with Dragon dictation along with smaller phrase technology. Any transcriptional errors that result from this process are unintentional.

## 2017-03-28 NOTE — Care Management (Signed)
Admitted to this facility with the diagnosis of orthostatic hypotension under observation status. Lives alone. Friend is [employer] [phone]) and Mindy [phone]). Last seen Dr, Sabra Heck 6 weeks ago. Prescriptions are filled at [facility]. Rolling walker and cane in the home, if needed. Takes care of all basic activities of daily living himself, drives. [place] [address] prior to this admission.  [facility]riends will transport Noah Ammons RN MSN [facility] [phone]

## 2017-03-28 NOTE — Progress Notes (Signed)
PT Cancellation Note  Patient Details Name: Noah Coleman MRN: [id number] DOB: 21-Apr-1930   Cancelled Treatment:    Reason Eval/Treat Not Completed: Medical issues which prohibited therapy.  Pt with LOC with attempt to stand up and is pending MRI.  Will hold PT until MRI complete and pt more medically stable.   Collie Siad PT, DPT 03/28/2017, 2:07 PM

## 2017-03-28 NOTE — Care Management Obs Status (Signed)
[place] NOTIFICATION   Patient Details  Name: Noah Coleman MRN: [id number] Date of Birth: Jun 16, 1930   Medicare Observation Status Notification Given:  Yes    Shelbie Ammons, RN 03/28/2017, 3:00 PM

## 2017-03-28 NOTE — ED Notes (Addendum)
Pt transported to CT ?

## 2017-03-28 NOTE — ED Notes (Signed)
Went to do orthostatic vital signs, when pt when to stand up, pt had loc, pt was incontinent of urine. MD notified, fluids hung. Pt now responding.

## 2017-03-28 NOTE — ED Notes (Signed)
Warm blankets placed on pt, will recheck temperature.

## 2017-03-28 NOTE — ED Triage Notes (Signed)
Pt came to ED via EMS from home after falling in shower with loc. Pt reports feeling dizzy beforehand. Not on blood thinners. History of cancer, currently receiving radiation. Denies chest pain, sob.

## 2017-03-28 NOTE — Progress Notes (Signed)
Pt.stated he had not voided since early the am. Bladder scan showed 600 ml.  Pt encouraged to stand an void, voided 100 ml. Dr Bridgett Larsson notified and order I&O cath.

## 2017-03-28 NOTE — Consult Note (Signed)
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  INTERVAL HISTORY: Patient is an 83 year old male whose last evaluated in clinic on March 20, 2017. At that time he was complaining of right-sided head pain and an MRI of the brain was ordered. This morning while patient was in the bathroom he had a syncopal episode and was found to have orthostatic hypotension in the emergency room. Head CT did not reveal any metastatic disease. He continues to have mild weakness and fatigue. He does not complain of dyspnea on exertion or shortness of breath. His constipation is better controlled as well. He has no other neurologic complaints. He denies any recent fevers or illnesses. He has a fair appetite and denies weight loss. He has no chest pain, hemoptysis, or cough. He has no nausea, vomiting, or diarrhea. He has no urinary complaints. Patient offers no further specific complaints today.   REVIEW OF SYSTEMS:   Review of Systems  Constitutional: Positive for malaise/fatigue. Negative for fever and weight loss.  Eyes: Negative for blurred vision and double vision.  Respiratory: Negative.  Negative for cough and shortness of breath.   Cardiovascular: Negative.  Negative for chest pain and leg swelling.  Gastrointestinal: Negative.  Negative for abdominal pain.  Genitourinary: Negative.   Musculoskeletal: Negative.   Skin: Positive for rash.  Neurological: Positive for dizziness, weakness and headaches. Negative for sensory change, speech change and focal weakness.  Psychiatric/Behavioral: The patient is not nervous/anxious.     As per HPI. Otherwise, a complete review of systems is negative.  PAST MEDICAL  HISTORY: Past Medical History:  Diagnosis Date  . Anemia   . BPH (benign prostatic hyperplasia)   . Chronic idiopathic urticaria   . Diverticulosis   . Dupuytren's contracture of right hand   . History of hiatal hernia   . Hyperlipemia   . Lung cancer ([place])   . Shingles     PAST SURGICAL HISTORY: Past Surgical History:  Procedure Laterality Date  . CARDIAC CATHETERIZATION Left 08/12/2016   Procedure: Left Heart Cath and Coronary Angiography;  Surgeon: Teodoro Spray, MD;  Location: [facility];  Service: Cardiovascular;  Laterality: Left;  . COLONOSCOPY    . EUS N/A 02/22/2016   Procedure: UPPER ENDOSCOPIC ULTRASOUND (EUS) LINEAR;  Surgeon: Cora Daniels, MD;  Location: [facility] ENDOSCOPY;  Service: Endoscopy;  Laterality: N/A;  . TONSILLECTOMY      FAMILY HISTORY: Family History  Problem Relation Age of Onset  . Cancer Mother        unknown type  . Lung cancer Father   . Colon cancer Father     ADVANCED DIRECTIVES (Y/N):  @ADVDIR @  HEALTH MAINTENANCE: Social History  Substance Use Topics  . Smoking status: Former Smoker    Years: 30.00    Types: Cigarettes    Quit date: 08/12/1981  . Smokeless tobacco: Never Used  . Alcohol use No     Colonoscopy:  PAP:  Bone density:  Lipid panel:  No Known Allergies  Current Facility-Administered Medications  Medication Dose Route Frequency Provider Last Rate Last Dose  . 0.9 %  sodium chloride infusion   Intravenous Continuous Demetrios Loll, MD 75 mL/hr at 03/28/17 1535    . acetaminophen (TYLENOL) tablet  650 mg  650 mg Oral Q6H PRN Demetrios Loll, MD       Or  . acetaminophen (TYLENOL) suppository 650 mg  650 mg Rectal Q6H PRN Demetrios Loll, MD      . acidophilus (RISAQUAD) capsule 1 capsule  1 capsule Oral Daily Demetrios Loll, MD      . albuterol (PROVENTIL) (2.5 MG/3ML) 0.083% nebulizer solution 2.5 mg  2.5 mg Nebulization Q2H PRN Demetrios Loll, MD      . ALPRAZolam Duanne Moron) tablet 0.5 mg  0.5 mg Oral Daily PRN Demetrios Loll, MD      . aspirin EC tablet 81 mg  81 mg Oral Daily Demetrios Loll, MD      . diphenhydrAMINE (BENADRYL) capsule 25 mg  25 mg Oral Q6H PRN Demetrios Loll, MD      . docusate sodium (COLACE) capsule 100 mg  100 mg Oral BID PRN Demetrios Loll, MD      . doxepin (SINEQUAN) capsule 10 mg  10 mg Oral QHS Demetrios Loll, MD      . enoxaparin (LOVENOX) injection 40 mg  40 mg Subcutaneous Q24H Demetrios Loll, MD      . hydrOXYzine (ATARAX/VISTARIL) tablet 25 mg  25 mg Oral TID PRN Demetrios Loll, MD      . Derrill Memo ON 03/29/2017] linaclotide (LINZESS) capsule 145 mcg  145 mcg Oral QAC breakfast Demetrios Loll, MD      . loratadine (CLARITIN) tablet 10 mg  10 mg Oral Daily PRN Demetrios Loll, MD      . LORazepam (ATIVAN) tablet 1 mg  1 mg Oral Once PRN Demetrios Loll, MD      . megestrol (MEGACE) tablet 40 mg  40 mg Oral Daily Demetrios Loll, MD      . mometasone-formoterol [facility]) 200-5 MCG/ACT inhaler 2 puff  2 puff Inhalation BID Demetrios Loll, MD      . ondansetron [facility]) tablet 4 mg  4 mg Oral Q6H PRN Demetrios Loll, MD       Or  . ondansetron [facility]) injection 4 mg  4 mg Intravenous Q6H PRN Demetrios Loll, MD      . oxyCODONE (Oxy IR/ROXICODONE) immediate release tablet 10 mg  10 mg Oral Q6H PRN Demetrios Loll, MD      . pseudoephedrine (SUDAFED) 12 hr tablet 120 mg  120 mg Oral Q12H PRN Demetrios Loll, MD      . senna-docusate (Senokot-S) tablet 1 tablet  1 tablet Oral QHS PRN Demetrios Loll, MD      . sucralfate (CARAFATE) tablet 1 g  1 g Oral BID Demetrios Loll, MD       Facility-Administered Medications Ordered in Other Encounters  Medication Dose Route Frequency Provider Last Rate Last Dose  . 0.9 %  sodium chloride infusion  250 mL Intravenous PRN Teodoro Spray, MD      . 0.9 %  sodium chloride infusion  250 mL Intravenous PRN Teodoro Spray, MD      . 0.9% sodium chloride infusion  1 mL/kg/hr Intravenous Continuous Teodoro Spray, MD      . 0.9% sodium chloride infusion  1 mL/kg/hr Intravenous Continuous Teodoro Spray, MD      . sodium  chloride flush (NS) 0.9 % injection 3 mL  3 mL Intravenous Q12H Teodoro Spray, MD      . sodium chloride flush (NS) 0.9 % injection 3 mL  3 mL Intravenous PRN Teodoro Spray, MD      . sodium  chloride flush (NS) 0.9 % injection 3 mL  3 mL Intravenous Q12H Bartholome Bill A, MD      . sodium chloride flush (NS) 0.9 % injection 3 mL  3 mL Intravenous PRN Teodoro Spray, MD        OBJECTIVE: Vitals:   03/28/17 1215 03/28/17 1608  BP: 135/71 (!) 135/53  Pulse: 96 (!) 101  Resp: 19 20  Temp:  98.6 F (37 C)     Body mass index is 20.89 kg/m.    ECOG FS:1 - Symptomatic but completely ambulatory  General: Well-developed, well-nourished, no acute distress. Eyes: Pink conjunctiva, anicteric sclera. HEENT: Normocephalic, moist mucous membranes, clear oropharnyx. Lungs: Clear to auscultation bilaterally. Heart: Regular rate and rhythm. No rubs, murmurs, or gallops. Abdomen: Soft, nontender, nondistended. No organomegaly noted, normoactive bowel sounds. Musculoskeletal: No edema, cyanosis, or clubbing. Neuro: Alert, answering all questions appropriately. Cranial nerves grossly intact. Skin: No rashes or petechiae noted. Psych: Normal affect. Lymphatics: No cervical, calvicular, axillary or inguinal LAD.   LAB RESULTS:  Lab Results  Component Value Date   NA 137 03/28/2017   K 4.4 03/28/2017   CL 110 03/28/2017   CO2 18 (L) 03/28/2017   GLUCOSE 122 (H) 03/28/2017   BUN 26 (H) 03/28/2017   CREATININE 1.19 03/28/2017   CALCIUM 8.4 (L) 03/28/2017   PROT 6.5 03/28/2017   ALBUMIN 3.3 (L) 03/28/2017   AST 37 03/28/2017   ALT 22 03/28/2017   ALKPHOS 37 (L) 03/28/2017   BILITOT 0.8 03/28/2017   GFRNONAA 54 (L) 03/28/2017   GFRAA >60 03/28/2017    Lab Results  Component Value Date   WBC 7.3 03/28/2017   NEUTROABS 6.2 03/28/2017   HGB 9.6 (L) 03/28/2017   HCT 27.9 (L) 03/28/2017   MCV 109.8 (H) 03/28/2017   PLT 165 03/28/2017     STUDIES: Ct Head Wo Contrast  Result Date:  03/28/2017 CLINICAL DATA:  Fall in shower this morning. EXAM: CT HEAD WITHOUT CONTRAST CT CERVICAL SPINE WITHOUT CONTRAST TECHNIQUE: Multidetector CT imaging of the head and cervical spine was performed following the standard protocol without intravenous contrast. Multiplanar CT image reconstructions of the cervical spine were also generated. COMPARISON:  Brain MRI 10/07/2016. PET CT 01/16/2017. CTA neck 08/16/2016. FINDINGS: CT HEAD FINDINGS Brain: No acute intracranial abnormality. Specifically, no hemorrhage, hydrocephalus, mass lesion, acute infarction, or significant intracranial injury. Mild cerebral atrophy. Vascular: No hyperdense vessel or unexpected calcification. Skull: No acute calvarial abnormality. Sinuses/Orbits: Visualized paranasal sinuses and mastoids clear. Orbital soft tissues unremarkable. Other: None CT CERVICAL SPINE FINDINGS Alignment: Normal Skull base and vertebrae: No fracture Soft tissues and spinal canal: Prevertebral soft tissues are normal. No epidural or paraspinal hematoma. Disc levels: Mild disc space narrowing and spurring C5-6 and C6-7. Mild bilateral degenerative facet disease. Upper chest: Rounded soft tissue area noted in the region of the left paraspinal soft tissues that CT to on image 76. This is stable since prior CTA performed 08/16/2016 and did not demonstrate increased uptake on prior PET CT from 01/16/2017. Therefore this most likely represents a benign process. There is underlying sclerosis and remodeling of the T2 posterior elements on the left. Other: None IMPRESSION: No acute intracranial abnormality. No acute bony abnormality in the cervical spine. Electronically Signed   By: Rolm Baptise M.D.   On: 03/28/2017 10:01   Ct Cervical Spine Wo Contrast  Result Date: 03/28/2017 CLINICAL DATA:  Fall in shower this morning. EXAM: CT HEAD WITHOUT CONTRAST CT CERVICAL SPINE WITHOUT  CONTRAST TECHNIQUE: Multidetector CT imaging of the head and cervical spine was performed  following the standard protocol without intravenous contrast. Multiplanar CT image reconstructions of the cervical spine were also generated. COMPARISON:  Brain MRI 10/07/2016. PET CT 01/16/2017. CTA neck 08/16/2016. FINDINGS: CT HEAD FINDINGS Brain: No acute intracranial abnormality. Specifically, no hemorrhage, hydrocephalus, mass lesion, acute infarction, or significant intracranial injury. Mild cerebral atrophy. Vascular: No hyperdense vessel or unexpected calcification. Skull: No acute calvarial abnormality. Sinuses/Orbits: Visualized paranasal sinuses and mastoids clear. Orbital soft tissues unremarkable. Other: None CT CERVICAL SPINE FINDINGS Alignment: Normal Skull base and vertebrae: No fracture Soft tissues and spinal canal: Prevertebral soft tissues are normal. No epidural or paraspinal hematoma. Disc levels: Mild disc space narrowing and spurring C5-6 and C6-7. Mild bilateral degenerative facet disease. Upper chest: Rounded soft tissue area noted in the region of the left paraspinal soft tissues that CT to on image 76. This is stable since prior CTA performed 08/16/2016 and did not demonstrate increased uptake on prior PET CT from 01/16/2017. Therefore this most likely represents a benign process. There is underlying sclerosis and remodeling of the T2 posterior elements on the left. Other: None IMPRESSION: No acute intracranial abnormality. No acute bony abnormality in the cervical spine. Electronically Signed   By: Rolm Baptise M.D.   On: 03/28/2017 10:01   Mr 3d Recon At Scanner  Result Date: 03/04/2017 CLINICAL DATA:  83 year old with low-density pancreatic head lesion on CT. Lung cancer diagnosed in December 2017. EXAM: MRI ABDOMEN WITHOUT AND WITH CONTRAST (INCLUDING MRCP) TECHNIQUE: Multiplanar multisequence MR imaging of the abdomen was performed both before and after the administration of intravenous contrast. Heavily T2-weighted images of the biliary and pancreatic ducts were obtained, and  three-dimensional MRCP images were rendered by post processing. CONTRAST:  13mL MULTIHANCE GADOBENATE DIMEGLUMINE 529 MG/ML IV SOLN COMPARISON:  PET-CT 09/11/2016 and 01/16/2017. Abdominal CT 08/26/2016. Abdominal MRI 02/09/2016. FINDINGS: Despite efforts by the technologist and patient, mild motion artifact is present on today's exam and could not be eliminated. This reduces exam sensitivity and specificity. Lower chest: No significant findings are seen within the visualized lower chest. Hepatobiliary: The liver appears unremarkable without focal lesion or abnormal enhancement. No evidence of gallstones, gallbladder wall thickening or biliary dilatation. Pancreas: A mildly septated cystic mass within the uncinate process does not appear significantly changed from the previous MRI, measuring approximately 2.6 x 1.4 x 1.9 cm. This lesion demonstrates no solid components or worrisome enhancement following contrast. This lesion was not hypermetabolic on previous PET CTs. There was focal hypermetabolism more distally in the pancreatic body and tail on the original PET-CT without corresponding abnormality on this study. There is no pancreatic ductal dilatation or surrounding inflammation. Spleen: Normal in size without focal abnormality. Adrenals/Urinary Tract: Both adrenal glands appear normal. Multiple small predominately Bosniak 1 renal cysts are again noted bilaterally. No worrisome renal lesion or hydronephrosis. Stomach/Bowel: No evidence of bowel wall thickening, distention or surrounding inflammatory change.Moderate size hiatal hernia again noted. Vascular/Lymphatic: There are no enlarged abdominal lymph nodes. There is diffuse atherosclerosis, better seen on CT. No focal aneurysm or evidence of large vessel occlusion. Other: No ascites or peritoneal nodularity. Musculoskeletal: T12 compression fracture results in greater than 50% loss vertebral body height mild osseous retropulsion, grossly stable from most  recent PET CT. No worrisome osseous lesions are seen. IMPRESSION: 1. Mildly septated cystic mass within the uncinate process of the pancreas is not significantly changed in size from baseline MRI 13 months ago. This  lesion was not hypermetabolic on PET-CT, and demonstrates no worrisome features. 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 Given the patient's advanced age and comorbidities, specific follow-up of this lesion is probably not necessary. Its stability can be addressed during follow-up imaging of the patient's lung cancer. 2. No metastases or acute findings demonstrated. 3. Grossly stable T12 compression fracture compared with recent PET-CT. 4. Bilateral renal cysts. Electronically Signed   By: Richardean Sale M.D.   On: 03/04/2017 13:41   Dg Chest Portable 1 View  Result Date: 03/28/2017 CLINICAL DATA:  Weakness.  Syncope in the shower EXAM: PORTABLE CHEST 1 VIEW COMPARISON:  PET CT 01/16/2017 FINDINGS: Low volumes with coarse interstitial opacities from chronic lung disease/ fibrosis. Known subpleural opacity in the left upper lobe. Patient has history of treated lung cancer. Normal heart size and mediastinal contours. There is no edema, consolidation, effusion, or pneumothorax. IMPRESSION: 1. No acute finding. 2. Pulmonary fibrosis and treated left lung cancer. Electronically Signed   By: Monte Fantasia M.D.   On: 03/28/2017 09:13   Mr Abdomen Mrcp Moise Boring Contast  Result Date: 03/04/2017 CLINICAL DATA:  83 year old with low-density pancreatic head lesion on CT. Lung cancer diagnosed in December 2017. EXAM: MRI ABDOMEN WITHOUT AND WITH CONTRAST (INCLUDING MRCP) TECHNIQUE: Multiplanar multisequence MR imaging of the abdomen was performed both before and after the administration of intravenous contrast. Heavily T2-weighted images of the biliary and pancreatic ducts were obtained, and three-dimensional MRCP images were rendered by post processing. CONTRAST:  11mL MULTIHANCE  GADOBENATE DIMEGLUMINE 529 MG/ML IV SOLN COMPARISON:  PET-CT 09/11/2016 and 01/16/2017. Abdominal CT 08/26/2016. Abdominal MRI 02/09/2016. FINDINGS: Despite efforts by the technologist and patient, mild motion artifact is present on today's exam and could not be eliminated. This reduces exam sensitivity and specificity. Lower chest: No significant findings are seen within the visualized lower chest. Hepatobiliary: The liver appears unremarkable without focal lesion or abnormal enhancement. No evidence of gallstones, gallbladder wall thickening or biliary dilatation. Pancreas: A mildly septated cystic mass within the uncinate process does not appear significantly changed from the previous MRI, measuring approximately 2.6 x 1.4 x 1.9 cm. This lesion demonstrates no solid components or worrisome enhancement following contrast. This lesion was not hypermetabolic on previous PET CTs. There was focal hypermetabolism more distally in the pancreatic body and tail on the original PET-CT without corresponding abnormality on this study. There is no pancreatic ductal dilatation or surrounding inflammation. Spleen: Normal in size without focal abnormality. Adrenals/Urinary Tract: Both adrenal glands appear normal. Multiple small predominately Bosniak 1 renal cysts are again noted bilaterally. No worrisome renal lesion or hydronephrosis. Stomach/Bowel: No evidence of bowel wall thickening, distention or surrounding inflammatory change.Moderate size hiatal hernia again noted. Vascular/Lymphatic: There are no enlarged abdominal lymph nodes. There is diffuse atherosclerosis, better seen on CT. No focal aneurysm or evidence of large vessel occlusion. Other: No ascites or peritoneal nodularity. Musculoskeletal: T12 compression fracture results in greater than 50% loss vertebral body height mild osseous retropulsion, grossly stable from most recent PET CT. No worrisome osseous lesions are seen. IMPRESSION: 1. Mildly septated cystic  mass within the uncinate process of the pancreas is not significantly changed in size from baseline MRI 13 months ago. This lesion was not hypermetabolic on PET-CT, and demonstrates no worrisome features. 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 Given the patient's advanced age and comorbidities, specific follow-up of this lesion is probably not necessary. Its stability can be addressed during follow-up imaging of  the patient's lung cancer. 2. No metastases or acute findings demonstrated. 3. Grossly stable T12 compression fracture compared with recent PET-CT. 4. Bilateral renal cysts. Electronically Signed   By: Richardean Sale M.D.   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  PLAN:    1. Orthostatic hypotension: Secondary dehydration. Continue IV fluids as ordered. CT scan head reviewed independently without any obvious etiology including metastatic disease. 2. 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 MRI the brain on October 07, 2016 revealed metastatic disease, but CT scan as above does not report reveal any indication of recurrence. PET scan results from Jan 16, 2017 reviewed independently with dramatic response to therapy with near complete resolution of metabolic activity. Repeat PET scan was scheduled for today, but will be postponed secondary to patient's hospital admission. No further chemotherapy is needed at this time. But given his bony metastasis, we will continue Zometa every 4 weeks. Return to clinic as previously scheduled in approximately 3 weeks for continuation of Zometa. He continues to express interest in continued quality of life over quantity of life.   3. Pain: Continue fentanyl patch to 50 g every 72 hours and 1-2 tabs of oxycodone as needed. 4. Brain metastasis: CT scan as above.  5.  Anemia: Likely contributing to patient's weakness and fatigue. Improved with recent transfusion.  Appreciate consult, call with questions.  Lloyd Huger, MD   03/28/2017 4:46 PM

## 2017-03-28 NOTE — ED Provider Notes (Signed)
[facility] Emergency Department Provider Note   ____________________________________________    I have reviewed the triage vital signs and the nursing notes.   HISTORY  Chief Complaint Fall and Loss of Consciousness     HPI Noah Coleman is a 83 y.o. male with a history of stage IV lung CVA treated by Dr. Grayland Ormond of [place]. Patient has had chemotherapy and radiation treatment but none in the last month. He reports today he was brushing his teeth when he became very dizzy and he fell. EMS reports that he became orthostatic when they tried to sit him up. Patient denies chest pain or palpitations. He reports he has no pain currently. He denies blood thinners. No fevers or chills. No dysuria. No abdominal pain nausea or vomiting. No calf pain or swelling. No history of the same. Rash noted on exam, patient reports he's had this for greater than 6 weeks   Past Medical History:  Diagnosis Date  . Anemia   . BPH (benign prostatic hyperplasia)   . Chronic idiopathic urticaria   . Diverticulosis   . Dupuytren's contracture of right hand   . History of hiatal hernia   . Hyperlipemia   . Lung cancer ([place])   . Shingles     Patient Active Problem List   Diagnosis Date Noted  . Anemia 12/06/2016  . Thrombocytopenia ([place]) 12/06/2016  . Constipation due to opioid therapy 10/18/2016  . 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 Goals of care, counseling/discussion 09/27/2016  . Mass of upper lobe of left lung 09/01/2016  . 2-vessel coronary artery disease 08/19/2016  . Small cell lung cancer, left ([place]) 08/19/2016  . Chest pain on exertion 08/07/2016  . Chronic urticaria 05/30/2016  . Cyst of pancreas 05/30/2016  . B12 deficiency 02/05/2016  . Dupuytren's contracture of right hand 05/29/2015    Past Surgical History:  Procedure Laterality Date  . CARDIAC CATHETERIZATION Left 08/12/2016   Procedure: Left Heart Cath and  Coronary Angiography;  Surgeon: Teodoro Spray, MD;  Location: [place] [facility];  Service: Cardiovascular;  Laterality: Left;  . COLONOSCOPY    . EUS N/A 02/22/2016   Procedure: UPPER ENDOSCOPIC ULTRASOUND (EUS) LINEAR;  Surgeon: Cora Daniels, MD;  Location: [facility] ENDOSCOPY;  Service: Endoscopy;  Laterality: N/A;  . TONSILLECTOMY      Prior to Admission medications   Medication Sig Start Date End Date Taking? Authorizing Provider  ALPRAZolam Duanne Moron) 0.5 MG tablet Take 1 tablet (0.5 mg total) by mouth daily. Take 1 hour before radiation 10/30/16   Noreene Filbert, MD  aspirin EC 81 MG tablet Take 1 tablet by mouth 1 day or 1 dose.    [provider]  Cyanocobalamin (RA VITAMIN B-12 TR) 1000 MCG TBCR Take 1 tablet by mouth 1 day or 1 dose.    [provider]  diphenhydrAMINE (BENADRYL) 25 mg capsule Take 25 mg by mouth every 6 (six) hours as needed.    [provider]  docusate sodium (COLACE) 100 MG capsule Take 1 capsule (100 mg total) by mouth 2 (two) times daily. 10/14/16 10/14/17  Lavonia Drafts, MD  doxepin (SINEQUAN) 10 MG capsule Take 10 mg by mouth at bedtime. 02/18/17 02/18/18  [provider]  Fluticasone-Salmeterol (ADVAIR DISKUS) 250-50 MCG/DOSE AEPB Inhale into the lungs. 09/30/16 09/30/17  [provider]  hydrOXYzine (ATARAX/VISTARIL) 25 MG tablet TAKE 1/2 TO 1 TABLET BY MOUTH TWICE DAILY AS NEEDED FOR ITCHING 02/17/17  [provider]  linaclotide (LINZESS) 145 MCG CAPS capsule Take 145 mcg by mouth. 10/18/16   [provider]  loratadine-pseudoephedrine (CLARITIN-D 24-HOUR) 10-240 MG 24 hr tablet Take 1 tablet by mouth daily.    [provider]  megestrol (MEGACE) 40 MG tablet TAKE ONE TABLET EVERY DAY 03/01/17   Lloyd Huger, MD  Multiple Vitamin (MULTIVITAMIN) capsule Take 1 capsule by mouth daily.    [provider]  Oxycodone HCl 10 MG TABS Take 1-2 tablets (10-20 mg total) by mouth every 6  (six) hours as needed. 01/21/17   Lloyd Huger, MD  Probiotic Product (PROBIOTIC DAILY) CAPS Take 1 capsule by mouth daily.    [provider]  prochlorperazine (COMPAZINE) 10 MG tablet TAKE ONE TABLET BY MOUTH EVERY 6 HOURS AS NEEDED FOR NAUSEA / VOMITING 02/28/17   Lloyd Huger, MD  sucralfate (CARAFATE) 1 g tablet Take 1 tablet (1 g total) by mouth 2 (two) times daily. Dissolve in 2-3 tbsp warm water, swish and swallow 11/21/16   Noreene Filbert, MD     Allergies Patient has no known allergies.  Family History  Problem Relation Age of Onset  . Cancer Mother        unknown type  . Lung cancer Father   . Colon cancer Father     Social History Social History  Substance Use Topics  . Smoking status: Former Smoker    Years: 30.00    Types: Cigarettes    Quit date: 08/12/1981  . Smokeless tobacco: Never Used  . Alcohol use No    Review of Systems  Constitutional: Dizziness Eyes: No visual changes.  ENT: No sore throat. Cardiovascular: Denies chest pain. Respiratory: Denies shortness of breath. Gastrointestinal: No abdominal pain.  No nausea, no vomiting.   Genitourinary: Negative for dysuria. Musculoskeletal: Negative for back pain. Skin: Negative for rash. Neurological: Negative for headaches   ____________________________________________   PHYSICAL EXAM:  VITAL SIGNS: ED Triage Vitals  Enc Vitals Group     BP 03/28/17 0847 135/69     Pulse Rate 03/28/17 0847 95     Resp 03/28/17 0847 20     Temp --      Temp src --      SpO2 03/28/17 0847 100 %     Weight 03/28/17 0844 69.4 kg (153 lb)     Height --      Head Circumference --      Peak Flow --      Pain Score --      Pain Loc --      Pain Edu? --      Excl. in [place]? --     Constitutional: Alert and oriented. Pleasant and interactive Eyes: Conjunctivae are normal.  Head: Atraumatic. Nose: No congestion/rhinnorhea. Mouth/Throat: Mucous membranes are moist.   Neck:  Mild vertebral  tenderness to palpation C6/C7 Cardiovascular: Normal rate, regular rhythm. Grossly normal heart sounds.  Good peripheral circulation. Respiratory: Normal respiratory effort.  No retractions. Lungs CTAB. Gastrointestinal: Soft and nontender. No distention.  No CVA tenderness. Genitourinary: deferred Musculoskeletal:   Warm and well perfused Neurologic:  Normal speech and language. No gross focal neurologic deficits are appreciated. Cranial nerves II through XII are normal, strength in all extremities appears normal. Skin:  Skin is warm, dry and intact. Urticarial-like erythematous rash noted to the abdomen, primarily in the right as well as the chin Psychiatric: Mood and affect are normal. Speech and behavior are normal.  ____________________________________________  LABS (all labs ordered are listed, but only abnormal results are displayed)  Labs Reviewed  CBC WITH DIFFERENTIAL/PLATELET - Abnormal; Notable for the following:       Result Value   RBC 2.54 (*)    Hemoglobin 9.6 (*)    HCT 27.9 (*)    MCV 109.8 (*)    MCH 37.6 (*)    Lymphs Abs 0.7 (*)    All other components within normal limits  COMPREHENSIVE METABOLIC PANEL - Abnormal; Notable for the following:    CO2 18 (*)    Glucose, Bld 122 (*)    BUN 26 (*)    Calcium 8.4 (*)    Albumin 3.3 (*)    Alkaline Phosphatase 37 (*)    GFR calc non Af Amer 54 (*)    All other components within normal limits  TROPONIN I  APTT  PROTIME-INR  MAGNESIUM  PHOSPHORUS  GLUCOSE, CAPILLARY  URINALYSIS, COMPLETE (UACMP) WITH MICROSCOPIC  CBG MONITORING, ED  TYPE AND SCREEN   ____________________________________________  EKG  ED ECG REPORT I, Lavonia Drafts, the attending physician, personally viewed and interpreted this ECG.  Date: 03/28/2017 EKG Time: 8:55 AM Rate: 99 Rhythm: normal sinus rhythm QRS Axis: normal Intervals: normal ST/T Wave abnormalities: Nonspecific changes Narrative Interpretation: Limited by motion  artifact  ____________________________________________  RADIOLOGY  CT head and cervical spine unremarkable, chest x-ray unremarkable ____________________________________________   PROCEDURES  Procedure(s) performed: No    Critical Care performed: No ____________________________________________   INITIAL IMPRESSION / ASSESSMENT AND PLAN / ED COURSE  Pertinent labs & imaging results that were available during my care of the patient were reviewed by me and considered in my medical decision making (see chart for details).  Patient with a history of stage IV lung cancer presents after a syncopal episode. Patient reports he was scheduled for an MRI of the brain and a PET scan today. Review of records demonstrates that the patient has recently complained to his oncologist about intermittent headaches and that was why he was scheduled for an MRI today. He has known metastatic disease.  Vital signs are reassuring upon arrival, we will check labs x-ray give IV fluids and placed on the monitor. CT head ordered given possible head injury/LOC/metastatic disease.  Patient reports a history of anemia, denies bloody stools or black stools.  ----------------------------------------- 10:32 AM on 03/28/2017 -----------------------------------------  Lab work is overall reassuring. I have paged Dr. Grayland Ormond. Patient continued to be dizzy when sitting up, he will require admission for IV fluids and further evaluation    ____________________________________________   FINAL CLINICAL IMPRESSION(S) / ED DIAGNOSES  Final diagnoses:  Syncope and collapse  Dehydration  Orthostatic dizziness      NEW MEDICATIONS STARTED DURING THIS VISIT:  New Prescriptions   No medications on file     Note:  This document was prepared using Dragon voice recognition software and may include unintentional dictation errors.    Lavonia Drafts, MD 03/28/17 1032

## 2017-03-28 NOTE — ED Notes (Signed)
Pt going to CT at this time. CT aware not to stand pt up.

## 2017-03-29 DIAGNOSIS — Z515 Encounter for palliative care: Secondary | ICD-10-CM | POA: Diagnosis not present

## 2017-03-29 DIAGNOSIS — R338 Other retention of urine: Secondary | ICD-10-CM | POA: Diagnosis present

## 2017-03-29 DIAGNOSIS — M720 Palmar fascial fibromatosis [Dupuytren]: Secondary | ICD-10-CM | POA: Diagnosis present

## 2017-03-29 DIAGNOSIS — L299 Pruritus, unspecified: Secondary | ICD-10-CM | POA: Diagnosis present

## 2017-03-29 DIAGNOSIS — B370 Candidal stomatitis: Secondary | ICD-10-CM | POA: Diagnosis present

## 2017-03-29 DIAGNOSIS — C3490 Malignant neoplasm of unspecified part of unspecified bronchus or lung: Secondary | ICD-10-CM | POA: Diagnosis not present

## 2017-03-29 DIAGNOSIS — R42 Dizziness and giddiness: Secondary | ICD-10-CM | POA: Diagnosis not present

## 2017-03-29 DIAGNOSIS — G8929 Other chronic pain: Secondary | ICD-10-CM | POA: Diagnosis present

## 2017-03-29 DIAGNOSIS — Z87891 Personal history of nicotine dependence: Secondary | ICD-10-CM | POA: Diagnosis not present

## 2017-03-29 DIAGNOSIS — C7931 Secondary malignant neoplasm of brain: Secondary | ICD-10-CM | POA: Diagnosis present

## 2017-03-29 DIAGNOSIS — R55 Syncope and collapse: Secondary | ICD-10-CM | POA: Diagnosis present

## 2017-03-29 DIAGNOSIS — D649 Anemia, unspecified: Secondary | ICD-10-CM | POA: Diagnosis present

## 2017-03-29 DIAGNOSIS — K5790 Diverticulosis of intestine, part unspecified, without perforation or abscess without bleeding: Secondary | ICD-10-CM | POA: Diagnosis present

## 2017-03-29 DIAGNOSIS — N401 Enlarged prostate with lower urinary tract symptoms: Secondary | ICD-10-CM | POA: Diagnosis present

## 2017-03-29 DIAGNOSIS — Z923 Personal history of irradiation: Secondary | ICD-10-CM | POA: Diagnosis not present

## 2017-03-29 DIAGNOSIS — Z801 Family history of malignant neoplasm of trachea, bronchus and lung: Secondary | ICD-10-CM | POA: Diagnosis not present

## 2017-03-29 DIAGNOSIS — Z66 Do not resuscitate: Secondary | ICD-10-CM | POA: Diagnosis present

## 2017-03-29 DIAGNOSIS — E860 Dehydration: Secondary | ICD-10-CM | POA: Diagnosis present

## 2017-03-29 DIAGNOSIS — Z79891 Long term (current) use of opiate analgesic: Secondary | ICD-10-CM | POA: Diagnosis not present

## 2017-03-29 DIAGNOSIS — I951 Orthostatic hypotension: Secondary | ICD-10-CM | POA: Diagnosis present

## 2017-03-29 DIAGNOSIS — Z7982 Long term (current) use of aspirin: Secondary | ICD-10-CM | POA: Diagnosis not present

## 2017-03-29 DIAGNOSIS — Z8673 Personal history of transient ischemic attack (TIA), and cerebral infarction without residual deficits: Secondary | ICD-10-CM | POA: Diagnosis not present

## 2017-03-29 DIAGNOSIS — Z800 Family history of malignant neoplasm of digestive organs: Secondary | ICD-10-CM | POA: Diagnosis not present

## 2017-03-29 DIAGNOSIS — Z9221 Personal history of antineoplastic chemotherapy: Secondary | ICD-10-CM | POA: Diagnosis not present

## 2017-03-29 DIAGNOSIS — E785 Hyperlipidemia, unspecified: Secondary | ICD-10-CM | POA: Diagnosis present

## 2017-03-29 DIAGNOSIS — Z7951 Long term (current) use of inhaled steroids: Secondary | ICD-10-CM | POA: Diagnosis not present

## 2017-03-29 DIAGNOSIS — Z7189 Other specified counseling: Secondary | ICD-10-CM | POA: Diagnosis not present

## 2017-03-29 DIAGNOSIS — C7951 Secondary malignant neoplasm of bone: Secondary | ICD-10-CM | POA: Diagnosis present

## 2017-03-29 DIAGNOSIS — Z85118 Personal history of other malignant neoplasm of bronchus and lung: Secondary | ICD-10-CM | POA: Diagnosis not present

## 2017-03-29 LAB — BASIC METABOLIC PANEL
Anion gap: 5 (ref 5–15)
BUN: 21 mg/dL — AB (ref 6–20)
CHLORIDE: 116 mmol/L — AB (ref 101–111)
CO2: 19 mmol/L — ABNORMAL LOW (ref 22–32)
CREATININE: 0.91 mg/dL (ref 0.61–1.24)
Calcium: 7.2 mg/dL — ABNORMAL LOW (ref 8.9–10.3)
GFR calc Af Amer: >60 mL/min (ref >60)
GLUCOSE: 82 mg/dL (ref 65–99)
Potassium: 3.7 mmol/L (ref 3.5–5.1)
SODIUM: 140 mmol/L (ref 135–145)

## 2017-03-29 MED ORDER — SODIUM CHLORIDE 0.9 % IV BOLUS (SEPSIS)
1000.0000 mL | Freq: Once | INTRAVENOUS | Status: AC
  Administered 2017-03-29: 1000 mL via INTRAVENOUS

## 2017-03-29 MED ORDER — MIDODRINE HCL 5 MG PO TABS
5.0000 mg | ORAL_TABLET | Freq: Three times a day (TID) | ORAL | Status: DC
  Administered 2017-03-29 – 2017-03-30 (×4): 5 mg via ORAL
  Filled 2017-03-29 (×5): qty 1

## 2017-03-29 NOTE — Evaluation (Signed)
Physical Therapy Evaluation Patient Details Name: Noah Coleman MRN: [id number] DOB: 1930/02/29 Today's Date: 03/29/2017   History of Present Illness  presented to ER secondary to fall on floor (remained x3 hours); admitted with syncopal episode related to orthostatic hypotension.  Clinical Impression  Upon evaluation, patient alert and oriented to all information; follows all commands and demonstrates good effort with tasks throughout session.  Bilat UE/LE generally weak and deconditioned, reporting generalized soreness throughout all extremities (due to fall prior to admission).  Currently requiring min/mod assist for bed mobility; min assist for unsupported sitting balance; min/mod assist for sit/stand and static stance with RW.  Heavy posterior weight shift with significant tremulousness/jerking in bilat LEs in stance; very high risk for buckling and fall.  Patient with onset of dizziness, 'fuzzy vision' in stance with significant drop in BP noted (standing 68/38; see vitals flowsheet for full assessment).  Additional OOB/gait efforts deferred due to symptomatic orthostasis.  RN/MD informed and aware. Would benefit from skilled PT to address above deficits and promote optimal return to PLOF; recommend transition to STR upon discharge from acute hospitalization.     Follow Up Recommendations SNF    Equipment Recommendations  Rolling walker with 5" wheels    Recommendations for Other Services       Precautions / Restrictions Precautions Precautions: Fall Restrictions Weight Bearing Restrictions: No      Mobility  Bed Mobility Overal bed mobility: Needs Assistance Bed Mobility: Supine to Sit;Sit to Supine     Supine to sit: Min assist Sit to supine: Mod assist      Transfers Overall transfer level: Needs assistance Equipment used: Rolling walker (2 wheeled) Transfers: Sit to/from Stand Sit to Stand: Min assist;Mod assist         General transfer comment: assist for  forward weight shift and lift off.  Very tremulous, unsteady in static stance  Ambulation/Gait             General Gait Details: unsafe/unable due to orthostatic hypotension  Stairs            Wheelchair Mobility    Modified Rankin (Stroke Patients Only)       Balance Overall balance assessment: Needs assistance Sitting-balance support: No upper extremity supported;Feet supported Sitting balance-Leahy Scale: Fair Sitting balance - Comments: repeated R lateral lean with fatigue, distraction or dynamic activities, min assist from therapist to correct/prevent LOB Postural control: Right lateral lean Standing balance support: Bilateral upper extremity supported Standing balance-Leahy Scale: Poor Standing balance comment: LEs very tremulous/jerky, high risk for buckling                             Pertinent Vitals/Pain Pain Assessment:  (generally sore from fall)    Home Living Family/patient expects to be discharged to:: Private residence Living Arrangements: Alone Available Help at Discharge: Friend(s);Available PRN/intermittently Type of Home: House Home Access: Level entry     Home Layout: One level Home Equipment: None      Prior Function Level of Independence: Independent         Comments: Indep with ADLs, household and community mobility without assist device; + driving; denies fall history.     Hand Dominance        Extremity/Trunk Assessment   Upper Extremity Assessment Upper Extremity Assessment: Overall WFL for tasks assessed    Lower Extremity Assessment Lower Extremity Assessment: Generalized weakness (grossly 4-/5 throughout)       Communication  Communication: No difficulties  Cognition Arousal/Alertness: Awake/alert Behavior During Therapy: WFL for tasks assessed/performed Overall Cognitive Status: Within Functional Limits for tasks assessed                                        General  Comments      Exercises Other Exercises Other Exercises: Unsupported sitting, worked on awareness and correction of progressive R lateral lean. Patient able to voice awareness, but requires cuing/assist from therapist to initiate correction. Other Exercises: Sit/stand with RW x2, mod assist to attain and maintain throughotu.  Tolerating only 20-30 seconds per trial due to symptomatic orthostasis   Assessment/Plan    PT Assessment Patient needs continued PT services  PT Problem List Decreased strength;Decreased range of motion;Decreased activity tolerance;Decreased balance;Decreased mobility;Decreased coordination;Decreased knowledge of use of DME;Decreased safety awareness;Decreased knowledge of precautions;Cardiopulmonary status limiting activity       PT Treatment Interventions DME instruction;Gait training;Functional mobility training;Therapeutic activities;Therapeutic exercise;Balance training;Patient/family education    PT Goals (Current goals can be found in the Care Plan section)  Acute Rehab PT Goals Patient Stated Goal: to get better PT Goal Formulation: With patient Time For Goal Achievement: 04/12/17 Potential to Achieve Goals: Good    Frequency Min 2X/week   Barriers to discharge Decreased caregiver support      Co-evaluation               AM-PAC PT "6 Clicks" Daily Activity  Outcome Measure Difficulty turning over in bed (including adjusting bedclothes, sheets and blankets)?: Total Difficulty moving from lying on back to sitting on the side of the bed? : Total Difficulty sitting down on and standing up from a chair with arms (e.g., wheelchair, bedside commode, etc,.)?: Total Help needed moving to and from a bed to chair (including a wheelchair)?: A Lot Help needed walking in hospital room?: A Lot Help needed climbing 3-5 steps with a railing? : A Lot 6 Click Score: 9    End of Session Equipment Utilized During Treatment: Gait belt Activity Tolerance:  Treatment limited secondary to medical complications (Comment) (symptomatic orthostaasis) Patient left: in bed;with call bell/phone within reach;with bed alarm set Nurse Communication: Mobility status PT Visit Diagnosis: Muscle weakness (generalized) (M62.81);Difficulty in walking, not elsewhere classified (R26.2)    Time: 6333-5456 PT Time Calculation (min) (ACUTE ONLY): 22 min   Charges:   PT Evaluation $PT Eval Moderate Complexity: 1 Procedure PT Treatments $Therapeutic Activity: 8-22 mins   PT G Codes:   PT G-Codes **NOT FOR INPATIENT CLASS** Functional Assessment Tool Used: AM-PAC 6 Clicks Basic Mobility Functional Limitation: Mobility: Walking and moving around Mobility: Walking and Moving Around Current Status (Y5638): At least 80 percent but less than 100 percent impaired, limited or restricted Mobility: Walking and Moving Around Goal Status [phone]): At least 1 percent but less than 20 percent impaired, limited or restricted    Noah Coleman, PT, DPT, NCS 03/29/17, 4:45 PM [phone]

## 2017-03-29 NOTE — Progress Notes (Signed)
PT Cancellation Note  Patient Details Name: Noah Coleman MRN: [id number] DOB: 18-Jun-1930   Cancelled Treatment:    Reason Eval/Treat Not Completed: Medical issues which prohibited therapy (Consult received and chart reviewed.  Orthostatics assessed with supine BP noted at 67/34, patient reporting mild 'fuzziness' of vision.  RN and MD informed/aware. Patient placed in trendelenburg position with improvement in BP to 113/61.  Will hold evaluation at this time and re-attempt as medically appropriate.)   Ajene Carchi H. Owens Shark, PT, DPT, NCS 03/29/17, 9:34 AM [phone]

## 2017-03-29 NOTE — Progress Notes (Signed)
Patient ID: BERDELL HOSTETLER, male   DOB: 24-Jan-1930, 83 y.o.   MRN: [id number]  [facility] PROGRESS NOTE  VERMON GRAYS IFO:[id number] DOB: April 10, 1930 DOA: 03/28/2017 PCP: Rusty Aus, MD  HPI/Subjective: Called to see patient this morning about a blood pressure of 64/34. The patient was admitted with orthostatic hypotension but this blood pressure was sitting down. Patient was admitted with fall and loss of consciousness. He lives alone.  Objective: Vitals:   03/29/17 1008 03/29/17 1011  BP: (!) 109/50 (!) 112/52  Pulse: 92 95  Resp: 18   Temp:      Filed Weights   03/28/17 0844 03/28/17 1645  Weight: 69.4 kg (153 lb) 69.9 kg (154 lb)    ROS: Review of Systems  Constitutional: Negative for chills and fever.  Eyes: Negative for blurred vision.  Respiratory: Negative for cough and shortness of breath.   Cardiovascular: Negative for chest pain.  Gastrointestinal: Negative for abdominal pain, constipation, diarrhea, nausea and vomiting.  Genitourinary: Negative for dysuria.  Musculoskeletal: Negative for joint pain.  Skin: Positive for itching.  Neurological: Positive for weakness. Negative for dizziness and headaches.   Exam: Physical Exam  HENT:  Nose: No mucosal edema.  Mouth/Throat: No oropharyngeal exudate or posterior oropharyngeal edema.  Eyes: Pupils are equal, round, and reactive to light. Conjunctivae, EOM and lids are normal.  Neck: No JVD present. Carotid bruit is not present. No edema present. No thyroid mass and no thyromegaly present.  Cardiovascular: S1 normal and S2 normal.  Exam reveals no gallop.   No murmur heard. Pulses:      Dorsalis pedis pulses are 2+ on the right side, and 2+ on the left side.  Respiratory: No respiratory distress. He has no wheezes. He has no rhonchi. He has no rales.  GI: Soft. Bowel sounds are normal. There is no tenderness.  Musculoskeletal:       Right ankle: He exhibits no swelling.       Left ankle: He exhibits no  swelling.  Lymphadenopathy:    He has no cervical adenopathy.  Neurological: He is alert. No cranial nerve deficit.  Skin: Skin is warm. No rash noted. Nails show no clubbing.  Psychiatric: He has a normal mood and affect.      Data Reviewed: Basic Metabolic Panel:  Recent Labs Lab 03/28/17 0846 03/28/17 1421 03/29/17 0512  NA 137  --  140  K 4.4  --  3.7  CL 110  --  116*  CO2 18*  --  19*  GLUCOSE 122*  --  82  BUN 26*  --  21*  CREATININE 1.19  --  0.91  CALCIUM 8.4*  --  7.2*  MG 2.4 2.5*  --   PHOS 3.0  --   --    Liver Function Tests:  Recent Labs Lab 03/28/17 0846  AST 37  ALT 22  ALKPHOS 37*  BILITOT 0.8  PROT 6.5  ALBUMIN 3.3*   CBC:  Recent Labs Lab 03/28/17 0846  WBC 7.3  NEUTROABS 6.2  HGB 9.6*  HCT 27.9*  MCV 109.8*  PLT 165   Cardiac Enzymes:  Recent Labs Lab 03/28/17 0846  TROPONINI <0.03    CBG:  Recent Labs Lab 03/28/17 0908  GLUCAP 95      Studies: Ct Head Wo Contrast  Result Date: 03/28/2017 CLINICAL DATA:  Fall in shower this morning. EXAM: CT HEAD WITHOUT CONTRAST CT CERVICAL SPINE WITHOUT CONTRAST TECHNIQUE: Multidetector CT imaging of the head and  cervical spine was performed following the standard protocol without intravenous contrast. Multiplanar CT image reconstructions of the cervical spine were also generated. COMPARISON:  Brain MRI 10/07/2016. PET CT 01/16/2017. CTA neck 08/16/2016. FINDINGS: CT HEAD FINDINGS Brain: No acute intracranial abnormality. Specifically, no hemorrhage, hydrocephalus, mass lesion, acute infarction, or significant intracranial injury. Mild cerebral atrophy. Vascular: No hyperdense vessel or unexpected calcification. Skull: No acute calvarial abnormality. Sinuses/Orbits: Visualized paranasal sinuses and mastoids clear. Orbital soft tissues unremarkable. Other: None CT CERVICAL SPINE FINDINGS Alignment: Normal Skull base and vertebrae: No fracture Soft tissues and spinal canal: Prevertebral  soft tissues are normal. No epidural or paraspinal hematoma. Disc levels: Mild disc space narrowing and spurring C5-6 and C6-7. Mild bilateral degenerative facet disease. Upper chest: Rounded soft tissue area noted in the region of the left paraspinal soft tissues that CT to on image 76. This is stable since prior CTA performed 08/16/2016 and did not demonstrate increased uptake on prior PET CT from 01/16/2017. Therefore this most likely represents a benign process. There is underlying sclerosis and remodeling of the T2 posterior elements on the left. Other: None IMPRESSION: No acute intracranial abnormality. No acute bony abnormality in the cervical spine. Electronically Signed   By: Rolm Baptise M.D.   On: 03/28/2017 10:01   Ct Cervical Spine Wo Contrast  Result Date: 03/28/2017 CLINICAL DATA:  Fall in shower this morning. EXAM: CT HEAD WITHOUT CONTRAST CT CERVICAL SPINE WITHOUT CONTRAST TECHNIQUE: Multidetector CT imaging of the head and cervical spine was performed following the standard protocol without intravenous contrast. Multiplanar CT image reconstructions of the cervical spine were also generated. COMPARISON:  Brain MRI 10/07/2016. PET CT 01/16/2017. CTA neck 08/16/2016. FINDINGS: CT HEAD FINDINGS Brain: No acute intracranial abnormality. Specifically, no hemorrhage, hydrocephalus, mass lesion, acute infarction, or significant intracranial injury. Mild cerebral atrophy. Vascular: No hyperdense vessel or unexpected calcification. Skull: No acute calvarial abnormality. Sinuses/Orbits: Visualized paranasal sinuses and mastoids clear. Orbital soft tissues unremarkable. Other: None CT CERVICAL SPINE FINDINGS Alignment: Normal Skull base and vertebrae: No fracture Soft tissues and spinal canal: Prevertebral soft tissues are normal. No epidural or paraspinal hematoma. Disc levels: Mild disc space narrowing and spurring C5-6 and C6-7. Mild bilateral degenerative facet disease. Upper chest: Rounded soft tissue  area noted in the region of the left paraspinal soft tissues that CT to on image 76. This is stable since prior CTA performed 08/16/2016 and did not demonstrate increased uptake on prior PET CT from 01/16/2017. Therefore this most likely represents a benign process. There is underlying sclerosis and remodeling of the T2 posterior elements on the left. Other: None IMPRESSION: No acute intracranial abnormality. No acute bony abnormality in the cervical spine. Electronically Signed   By: Rolm Baptise M.D.   On: 03/28/2017 10:01   Mr Jeri Cos ON Contrast  Result Date: 03/28/2017 CLINICAL DATA:  83 y/o M; lung cancer and headache with syncopal episode. EXAM: MRI HEAD WITHOUT AND WITH CONTRAST TECHNIQUE: Multiplanar, multiecho pulse sequences of the brain and surrounding structures were obtained without and with intravenous contrast. CONTRAST:  23mL MULTIHANCE GADOBENATE DIMEGLUMINE 529 MG/ML IV SOLN COMPARISON:  10/07/2016 MRI of the brain. 03/28/2017 CT of the head. FINDINGS: Brain: Increased T2 signal within the upper cervical spinal cord and lower medulla new from the prior study. Previous foci of enhancement in left frontal lobe and right cerebellum are no longer appreciated. New focus of irregular enhancement spanning approximately 15 mm in the left anterior inferior frontal lobe along the falx with  T2 FLAIR signal abnormality that is new from the prior study (Series 12, image 61 and series 14, image 17) probably representing metastasis. No evidence of acute infarction, hemorrhage, significant mass effect, or hydrocephalus. Stable background of mild chronic microvascular ischemic changes and parenchymal volume loss of the brain. Vascular: Normal flow voids. Skull: Normal marrow signal. Sinuses/Orbits: Negative. Other: None. IMPRESSION: 1. New focus of irregular intra-axial enhancement spanning 15 mm and left anterior inferior frontal lobe along falx with edema, probably new metastasis. 2. Interval resolution of  left frontal and right cerebellar hemisphere metastasis. 3. Abnormal T2 signal within the lower medulla and upper cervical spinal cord. MRI of the cervical spine with and without contrast is recommended to evaluate for spinal cord metastasis. These results will be called to the ordering clinician or representative by the Radiologist Assistant, and communication documented in the PACS or zVision Dashboard. Electronically Signed   By: Kristine Garbe M.D.   On: 03/28/2017 18:27   Dg Chest Portable 1 View  Result Date: 03/28/2017 CLINICAL DATA:  Weakness.  Syncope in the shower EXAM: PORTABLE CHEST 1 VIEW COMPARISON:  PET CT 01/16/2017 FINDINGS: Low volumes with coarse interstitial opacities from chronic lung disease/ fibrosis. Known subpleural opacity in the left upper lobe. Patient has history of treated lung cancer. Normal heart size and mediastinal contours. There is no edema, consolidation, effusion, or pneumothorax. IMPRESSION: 1. No acute finding. 2. Pulmonary fibrosis and treated left lung cancer. Electronically Signed   By: Monte Fantasia M.D.   On: 03/28/2017 09:13    Scheduled Meds: . acidophilus  1 capsule Oral Daily  . aspirin EC  81 mg Oral Daily  . doxepin  10 mg Oral QHS  . enoxaparin (LOVENOX) injection  40 mg Subcutaneous Q24H  . linaclotide  145 mcg Oral QAC breakfast  . megestrol  40 mg Oral Daily  . midodrine  5 mg Oral TID WC  . mometasone-formoterol  2 puff Inhalation BID  . sucralfate  1 g Oral BID   Continuous Infusions: . sodium chloride 75 mL/hr at 03/29/17 1024    Assessment/Plan:   1. Hypotension with orthostatic hypotension. Syncope. I will given IV fluid bolus. Continue maintenance IV fluids. Start midodrine. Continue to monitor blood pressure and orthostatics during hospital course. 2. Metastatic lung cancer to brain and bone. MRI of the brain shows a new focus. Radiologist recommended an MRI of the cervical spine to rule out a new focus there. I  ordered this test. Oncology following. Patient is a DO NOT RESUSCITATE. 3. Generalized itching. Not seeing a rash. Trial of Atarax. 4. Chronic pain on fentanyl patch 5. Anemia. Continue to monitor with IV fluid hydration.  Code Status:     Code Status Orders        Start     Ordered   03/28/17 1355  Do not attempt resuscitation (DNR)  Continuous    Question Answer Comment  In the event of cardiac or respiratory ARREST Do not call a "code blue"   In the event of cardiac or respiratory ARREST Do not perform Intubation, CPR, defibrillation or ACLS   In the event of cardiac or respiratory ARREST Use medication by any route, position, wound care, and other measures to relive pain and suffering. May use oxygen, suction and manual treatment of airway obstruction as needed for comfort.      03/28/17 1354    Code Status History    Date Active Date Inactive Code Status Order ID Comments User Context  This patient has a current code status but no historical code status.    Advance Directive Documentation     Most Recent Value  Type of Advance Directive  Healthcare Power of Attorney, Living will  Pre-existing out of facility DNR order (yellow form or pink MOST form)  -  "MOST" Form in Place?  -     Family Communication: Permission to speak in front of family at the bedside Disposition Plan: To be determined based on how he does with physical therapy  Consultants:  Oncology  Time spent: 28 minutes  [place], [place]

## 2017-03-30 ENCOUNTER — Inpatient Hospital Stay: Payer: Medicare HMO

## 2017-03-30 LAB — BASIC METABOLIC PANEL
Anion gap: 5 (ref 5–15)
BUN: 20 mg/dL (ref 6–20)
CHLORIDE: 118 mmol/L — AB (ref 101–111)
CO2: 17 mmol/L — ABNORMAL LOW (ref 22–32)
Calcium: 7.1 mg/dL — ABNORMAL LOW (ref 8.9–10.3)
Creatinine, Ser: 0.8 mg/dL (ref 0.61–1.24)
GFR calc Af Amer: >60 mL/min (ref >60)
GFR calc non Af Amer: >60 mL/min (ref >60)
GLUCOSE: 87 mg/dL (ref 65–99)
POTASSIUM: 3.2 mmol/L — AB (ref 3.5–5.1)
Sodium: 140 mmol/L (ref 135–145)

## 2017-03-30 LAB — CBC
HCT: 16.6 % — ABNORMAL LOW (ref 40.0–52.0)
HEMOGLOBIN: 5.8 g/dL — AB (ref 13.0–18.0)
MCH: 37.5 pg — ABNORMAL HIGH (ref 26.0–34.0)
MCHC: 34.5 g/dL (ref 32.0–36.0)
MCV: 108.5 fL — AB (ref 80.0–100.0)
Platelets: 108 10*3/uL — ABNORMAL LOW (ref 150–440)
RBC: 1.53 MIL/uL — AB (ref 4.40–5.90)
RDW: 12.4 % (ref 11.5–14.5)
WBC: 3.9 10*3/uL (ref 3.8–10.6)

## 2017-03-30 LAB — PREPARE RBC (CROSSMATCH)

## 2017-03-30 LAB — FERRITIN: Ferritin: 541 ng/mL — ABNORMAL HIGH (ref 24–336)

## 2017-03-30 MED ORDER — POTASSIUM CHLORIDE CRYS ER 20 MEQ PO TBCR
40.0000 meq | EXTENDED_RELEASE_TABLET | Freq: Once | ORAL | Status: AC
  Administered 2017-03-30: 40 meq via ORAL
  Filled 2017-03-30: qty 2

## 2017-03-30 MED ORDER — MIDODRINE HCL 2.5 MG PO TABS
10.0000 mg | ORAL_TABLET | Freq: Three times a day (TID) | ORAL | Status: DC
  Administered 2017-03-30 – 2017-04-01 (×5): 10 mg via ORAL
  Filled 2017-03-30 (×2): qty 2
  Filled 2017-03-30 (×2): qty 4
  Filled 2017-03-30 (×2): qty 2
  Filled 2017-03-30: qty 4
  Filled 2017-03-30: qty 2

## 2017-03-30 MED ORDER — DEXAMETHASONE SODIUM PHOSPHATE 10 MG/ML IJ SOLN
10.0000 mg | Freq: Three times a day (TID) | INTRAMUSCULAR | Status: DC
  Administered 2017-03-30 – 2017-03-31 (×2): 10 mg via INTRAVENOUS
  Filled 2017-03-30 (×3): qty 1

## 2017-03-30 MED ORDER — SODIUM CHLORIDE 0.9 % IV SOLN
Freq: Once | INTRAVENOUS | Status: AC
  Administered 2017-03-30: 13:00:00 via INTRAVENOUS

## 2017-03-30 MED ORDER — NYSTATIN 100000 UNIT/ML MT SUSP
5.0000 mL | Freq: Four times a day (QID) | OROMUCOSAL | Status: DC
  Administered 2017-03-30 – 2017-04-01 (×8): 500000 [IU] via ORAL
  Filled 2017-03-30 (×8): qty 5

## 2017-03-30 MED ORDER — ENSURE ENLIVE PO LIQD
237.0000 mL | Freq: Three times a day (TID) | ORAL | Status: DC
  Administered 2017-03-30 – 2017-03-31 (×5): 237 mL via ORAL

## 2017-03-30 MED ORDER — FINASTERIDE 5 MG PO TABS
5.0000 mg | ORAL_TABLET | Freq: Every day | ORAL | Status: DC
  Administered 2017-03-30 – 2017-04-01 (×3): 5 mg via ORAL
  Filled 2017-03-30 (×3): qty 1

## 2017-03-30 MED ORDER — BISACODYL 5 MG PO TBEC
5.0000 mg | DELAYED_RELEASE_TABLET | Freq: Every day | ORAL | Status: DC | PRN
  Administered 2017-03-30: 5 mg via ORAL
  Filled 2017-03-30: qty 1

## 2017-03-30 MED ORDER — GADOBENATE DIMEGLUMINE 529 MG/ML IV SOLN
15.0000 mL | Freq: Once | INTRAVENOUS | Status: AC | PRN
  Administered 2017-03-30: 15 mL via INTRAVENOUS

## 2017-03-30 NOTE — Clinical Social Work Placement (Addendum)
   CLINICAL SOCIAL WORK PLACEMENT  NOTE  Date:  03/30/2017  Patient Details  Name: Noah Coleman MRN: [id number] Date of Birth: [birth year]  Clinical Social Work is seeking post-discharge placement for this patient at the [place] level of care (*CSW will initial, date and re-position this form in  chart as items are completed):  Yes   Patient/family provided with [place] Work Department's list of facilities offering this level of care within the geographic area requested by the patient (or if unable, by the patient's family).  Yes   Patient/family informed of their freedom to choose among providers that offer the needed level of care, that participate in Medicare, Medicaid or managed care program needed by the patient, have an available bed and are willing to accept the patient.  Yes   Patient/family informed of [facility]'s ownership interest in [facility] and [facility], as well as of the fact that they are under no obligation to receive care at these facilities.  PASRR submitted to EDS on 03/30/17     PASRR number received on 03/30/17     Existing PASRR number confirmed on       FL2 transmitted to all facilities in geographic area requested by pt/family on 03/30/17     FL2 transmitted to all facilities within larger geographic area on       Patient informed that his/her managed care company has contracts with or will negotiate with certain facilities, including the following:         03-31-17   Patient/family informed of bed offers received. (Updated Evette Cristal, MSW, [place], 04-01-17)  Patient chooses bed at  [facility]  (Updated Evette Cristal, MSW, [place], 04-01-17)     Physician recommends and patient chooses bed at      Patient to be transferred to  [facility] on  04-01-17  (Updated Evette Cristal, MSW, [place], 04-01-17).  Patient to be transferred to facility by  [facility]  (Updated Evette Cristal, MSW,  [place], 04-01-17)     Patient family notified on  04-01-17 of transfer.  (Updated Evette Cristal, MSW, [place], 04-01-17)  Name of family member notified:   Delores Truitt at bedside  (Updated Evette Cristal, MSW, [place], 04-01-17)     PHYSICIAN Please sign DNR     Additional Comment:    _______________________________________________ Zettie Pho, LCSW 03/30/2017, 1:49 PM   Jones Broom. Norval Morton, MSW, [place]  04/01/2017 12:47 PM  (Updated Evette Cristal, MSW, [place], 04-01-17)

## 2017-03-30 NOTE — Progress Notes (Signed)
Patient ID: Noah Coleman, male   DOB: 1930-02-26, 83 y.o.   MRN: [id number]   [facility] PROGRESS NOTE  Noah Coleman LOV:[id number] DOB: 05/13/1930 DOA: 03/28/2017 PCP: Rusty Aus, MD  HPI/Subjective: Patient complaining of some neck pain today. Patient still very weak. Itching better after Atarax given.  Objective: Vitals:   03/30/17 1312 03/30/17 1407  BP: (!) 98/48 (!) 118/53  Pulse: 98 97  Resp: 20   Temp: (!) 97.4 F (36.3 C) 98.1 F (36.7 C)    Filed Weights   03/28/17 0844 03/28/17 1645  Weight: 69.4 kg (153 lb) 69.9 kg (154 lb)    ROS: Review of Systems  Constitutional: Negative for chills and fever.  Eyes: Negative for blurred vision.  Respiratory: Negative for cough and shortness of breath.   Cardiovascular: Negative for chest pain.  Gastrointestinal: Negative for abdominal pain, constipation, diarrhea, nausea and vomiting.  Genitourinary: Negative for dysuria.  Musculoskeletal: Positive for neck pain. Negative for joint pain.  Skin: Positive for itching.  Neurological: Positive for weakness. Negative for dizziness and headaches.   Exam: Physical Exam  Constitutional: He is oriented to person, place, and time.  HENT:  Nose: No mucosal edema.  Mouth/Throat: No oropharyngeal exudate or posterior oropharyngeal edema.  Eyes: Pupils are equal, round, and reactive to light. Conjunctivae, EOM and lids are normal.  Neck: No JVD present. Carotid bruit is not present. No edema present. No thyroid mass and no thyromegaly present.  Cardiovascular: S1 normal and S2 normal.  Exam reveals no gallop.   No murmur heard. Pulses:      Dorsalis pedis pulses are 2+ on the right side, and 2+ on the left side.  Respiratory: No respiratory distress. He has no wheezes. He has no rhonchi. He has no rales.  GI: Soft. Bowel sounds are normal. There is no tenderness.  Musculoskeletal:       Right ankle: He exhibits no swelling.       Left ankle: He exhibits no  swelling.  Lymphadenopathy:    He has no cervical adenopathy.  Neurological: He is alert and oriented to person, place, and time. No cranial nerve deficit.  Right arm and right leg 4+ out of 5 power. Left leg and arm 5 out of 5 power.  Skin: Skin is warm. No rash noted. Nails show no clubbing.  Psychiatric: He has a normal mood and affect.      Data Reviewed: Basic Metabolic Panel:  Recent Labs Lab 03/28/17 0846 03/28/17 1421 03/29/17 0512 03/30/17 0434  NA 137  --  140 140  K 4.4  --  3.7 3.2*  CL 110  --  116* 118*  CO2 18*  --  19* 17*  GLUCOSE 122*  --  82 87  BUN 26*  --  21* 20  CREATININE 1.19  --  0.91 0.80  CALCIUM 8.4*  --  7.2* 7.1*  MG 2.4 2.5*  --   --   PHOS 3.0  --   --   --    Liver Function Tests:  Recent Labs Lab 03/28/17 0846  AST 37  ALT 22  ALKPHOS 37*  BILITOT 0.8  PROT 6.5  ALBUMIN 3.3*   CBC:  Recent Labs Lab 03/28/17 0846 03/30/17 0434  WBC 7.3 3.9  NEUTROABS 6.2  --   HGB 9.6* 5.8*  HCT 27.9* 16.6*  MCV 109.8* 108.5*  PLT 165 108*   Cardiac Enzymes:  Recent Labs Lab 03/28/17 0846  TROPONINI <0.03  CBG:  Recent Labs Lab 03/28/17 0908  GLUCAP 95      Studies: Mr Jeri Cos NT Contrast  Result Date: 03/28/2017 CLINICAL DATA:  83 y/o M; lung cancer and headache with syncopal episode. EXAM: MRI HEAD WITHOUT AND WITH CONTRAST TECHNIQUE: Multiplanar, multiecho pulse sequences of the brain and surrounding structures were obtained without and with intravenous contrast. CONTRAST:  55mL MULTIHANCE GADOBENATE DIMEGLUMINE 529 MG/ML IV SOLN COMPARISON:  10/07/2016 MRI of the brain. 03/28/2017 CT of the head. FINDINGS: Brain: Increased T2 signal within the upper cervical spinal cord and lower medulla new from the prior study. Previous foci of enhancement in left frontal lobe and right cerebellum are no longer appreciated. New focus of irregular enhancement spanning approximately 15 mm in the left anterior inferior frontal lobe  along the falx with T2 FLAIR signal abnormality that is new from the prior study (Series 12, image 61 and series 14, image 17) probably representing metastasis. No evidence of acute infarction, hemorrhage, significant mass effect, or hydrocephalus. Stable background of mild chronic microvascular ischemic changes and parenchymal volume loss of the brain. Vascular: Normal flow voids. Skull: Normal marrow signal. Sinuses/Orbits: Negative. Other: None. IMPRESSION: 1. New focus of irregular intra-axial enhancement spanning 15 mm and left anterior inferior frontal lobe along falx with edema, probably new metastasis. 2. Interval resolution of left frontal and right cerebellar hemisphere metastasis. 3. Abnormal T2 signal within the lower medulla and upper cervical spinal cord. MRI of the cervical spine with and without contrast is recommended to evaluate for spinal cord metastasis. These results will be called to the ordering clinician or representative by the Radiologist Assistant, and communication documented in the PACS or zVision Dashboard. Electronically Signed   By: Kristine Garbe M.D.   On: 03/28/2017 18:27   Mr Cervical Spine W Wo Contrast  Result Date: 03/30/2017 CLINICAL DATA:  Patient with a history of stage IV lung carcinoma who became unresponsive 03/28/2017. Abnormal appearance of the upper cervical cord brain MRI 03/28/2017. EXAM: MRI CERVICAL SPINE WITHOUT AND WITH CONTRAST TECHNIQUE: Multiplanar and multiecho pulse sequences of the cervical spine, to include the craniocervical junction and cervicothoracic junction, were obtained without and with intravenous contrast. CONTRAST:  15 ml MULTIHANCE GADOBENATE DIMEGLUMINE 529 MG/ML IV SOLN COMPARISON:  Brain MRI 03/28/2017. FINDINGS: Alignment: Maintained. Vertebrae: Vertebral body height is maintained. Metastatic deposits are seen in the T1 and T2 vertebral bodies. Lesion in the T1 vertebral body measures 1 cm AP on image 11 of series 4. Cord:  Innumerable enhancing lesions are seen throughout the imaged cord consistent with metastatic disease. Index lesion posterior to C5-6 measures 0.7 cm AP on image 9 of series 15. Posterior Fossa, vertebral arteries, paraspinal tissues: Negative. Disc levels: Overall mild cervical spondylosis is most notable at C4-5 where a central disc protrusion contacts the ventral cord. IMPRESSION: Innumerable enhancing lesions throughout the visualized cervical and thoracic cord consistent with metastatic disease. Osseous metastases are also seen and best visualized in the T1 and T2 vertebral bodies. Electronically Signed   By: Inge Rise M.D.   On: 03/30/2017 12:48    Scheduled Meds: . acidophilus  1 capsule Oral Daily  . aspirin EC  81 mg Oral Daily  . dexamethasone  10 mg Intravenous Q8H  . doxepin  10 mg Oral QHS  . enoxaparin (LOVENOX) injection  40 mg Subcutaneous Q24H  . finasteride  5 mg Oral Daily  . linaclotide  145 mcg Oral QAC breakfast  . megestrol  40 mg Oral Daily  .  midodrine  10 mg Oral TID WC  . mometasone-formoterol  2 puff Inhalation BID  . nystatin  5 mL Oral QID  . sucralfate  1 g Oral BID    Assessment/Plan:   1. Hypotension with orthostatic hypotension. Syncope. Increase midodrine to 10 mg 3 times a day. Stop IV fluids with drop in hemoglobin. 2. Anemia. Today's hemoglobin 5.8. Transfuse 1 unit of packed red blood cells and recheck hemoglobin tomorrow. 3. Metastatic lung cancer to brain and cervical spine and thoracic spine with invasion of the cord. Weakness on the right side today. Start Decadron 10 mg IV every 8 hours. Palliative care consultation. Radiation oncology consultation. MRI of the brain shows a new focus. Patient is a DO NOT RESUSCITATE. Overall prognosis is poor. 4. Generalized itching. Not seeing a rash. Trial of Atarax. 5. Chronic pain on fentanyl patch 6. Thrush start nystatin swish and swallow 7. Urinary retention. Foley catheter placed. Started  finasteride. Unable to give alpha blockers with orthostatic hypotension. 8. Weakness. Physical therapy recommended rehabilitation. Likely will not be able to go home living by himself.  Code Status:     Code Status Orders        Start     Ordered   03/28/17 1355  Do not attempt resuscitation (DNR)  Continuous    Question Answer Comment  In the event of cardiac or respiratory ARREST Do not call a "code blue"   In the event of cardiac or respiratory ARREST Do not perform Intubation, CPR, defibrillation or ACLS   In the event of cardiac or respiratory ARREST Use medication by any route, position, wound care, and other measures to relive pain and suffering. May use oxygen, suction and manual treatment of airway obstruction as needed for comfort.      03/28/17 1354    Code Status History    Date Active Date Inactive Code Status Order ID Comments User Context   This patient has a current code status but no historical code status.    Advance Directive Documentation     Most Recent Value  Type of Advance Directive  Healthcare Power of Attorney, Living will  Pre-existing out of facility DNR order (yellow form or pink MOST form)  -  "MOST" Form in Place?  -     Family Communication: Spoke with Dolores this morning and on the phone this afternoon Disposition Plan: To be determined based clinical course  Consultants:  Oncology  Time spent: 28 minutes.  Loletha Grayer  [employer]

## 2017-03-30 NOTE — NC FL2 (Signed)
[place] LEVEL OF CARE SCREENING TOOL     IDENTIFICATION  Patient Name: Noah Coleman Birthdate: [birth year] Sex: male Admission Date (Current Location): 03/28/2017  [place] and [place] Number:  Engineering geologist and Address:  [facility], [address] [place], [place] [address]      Provider Number: [id number]  Attending Physician Name and Address:  Loletha Grayer, MD  Relative Name and Phone Number:       Current Level of Care: Hospital Recommended Level of Care: [place] Prior Approval Number:    Date Approved/Denied:   PASRR Number: [id number]  Discharge Plan: SNF    Current Diagnoses: Patient Active Problem List   Diagnosis Date Noted  . Orthostatic hypotension 03/29/2017  . Orthostatic hypertension 03/28/2017  . Anemia 12/06/2016  . Thrombocytopenia ([place]) 12/06/2016  . Constipation due to opioid therapy 10/18/2016  . 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 Goals of care, counseling/discussion 09/27/2016  . Mass of upper lobe of left lung 09/01/2016  . 2-vessel coronary artery disease 08/19/2016  . Small cell lung cancer, left ([facility]) 08/19/2016  . Chest pain on exertion 08/07/2016  . Chronic urticaria 05/30/2016  . Cyst of pancreas 05/30/2016  . B12 deficiency 02/05/2016  . Dupuytren's contracture of right hand 05/29/2015    Orientation RESPIRATION BLADDER Height & Weight     Self, Time, Situation, Place  Normal Continent Weight: 154 lb (69.9 kg) Height:  6' (182.9 cm)  BEHAVIORAL SYMPTOMS/MOOD NEUROLOGICAL BOWEL NUTRITION STATUS      Continent    AMBULATORY STATUS COMMUNICATION OF NEEDS Skin   Extensive Assist Verbally Normal                       Personal Care Assistance Level of Assistance  Bathing, Feeding, Dressing Bathing Assistance: Maximum assistance Feeding assistance: Limited assistance Dressing Assistance: Maximum assistance     Functional  Limitations Info             SPECIAL CARE FACTORS FREQUENCY  PT (By licensed PT)     PT Frequency: Up to 5X per day, 5 days per week              Contractures      Additional Factors Info  Code Status Code Status Info: DNR             Current Medications (03/30/2017):  This is the current hospital active medication list Current Facility-Administered Medications  Medication Dose Route Frequency Provider Last Rate Last Dose  . acetaminophen (TYLENOL) tablet 650 mg  650 mg Oral Q6H PRN Demetrios Loll, MD       Or  . acetaminophen (TYLENOL) suppository 650 mg  650 mg Rectal Q6H PRN Demetrios Loll, MD      . acidophilus (RISAQUAD) capsule 1 capsule  1 capsule Oral Daily Demetrios Loll, MD   1 capsule at 03/30/17 0730  . albuterol (PROVENTIL) (2.5 MG/3ML) 0.083% nebulizer solution 2.5 mg  2.5 mg Nebulization Q2H PRN Demetrios Loll, MD      . ALPRAZolam Duanne Moron) tablet 0.5 mg  0.5 mg Oral Daily PRN Demetrios Loll, MD   0.5 mg at 03/30/17 1031  . aspirin EC tablet 81 mg  81 mg Oral Daily Demetrios Loll, MD   81 mg at 03/30/17 0730  . bisacodyl (DULCOLAX) EC tablet 5 mg  5 mg Oral Daily PRN Loletha Grayer, MD   5 mg at 03/30/17 0912  . dexamethasone (  DECADRON) injection 10 mg  10 mg Intravenous Q8H Wieting, Richard, MD      . diphenhydrAMINE (BENADRYL) capsule 25 mg  25 mg Oral Q6H PRN Demetrios Loll, MD      . docusate sodium (COLACE) capsule 100 mg  100 mg Oral BID PRN Demetrios Loll, MD   100 mg at 03/30/17 0912  . doxepin (SINEQUAN) capsule 10 mg  10 mg Oral QHS Demetrios Loll, MD   10 mg at 03/29/17 2100  . enoxaparin (LOVENOX) injection 40 mg  40 mg Subcutaneous Q24H Demetrios Loll, MD   40 mg at 03/29/17 2100  . finasteride (PROSCAR) tablet 5 mg  5 mg Oral Daily Loletha Grayer, MD   5 mg at 03/30/17 0912  . hydrOXYzine (ATARAX/VISTARIL) tablet 25 mg  25 mg Oral TID PRN Demetrios Loll, MD   25 mg at 03/29/17 1009  . linaclotide (LINZESS) capsule 145 mcg  145 mcg Oral QAC breakfast Demetrios Loll, MD   145 mcg at  03/30/17 0730  . loratadine (CLARITIN) tablet 10 mg  10 mg Oral Daily PRN Demetrios Loll, MD      . LORazepam (ATIVAN) tablet 1 mg  1 mg Oral Once PRN Demetrios Loll, MD      . megestrol (MEGACE) tablet 40 mg  40 mg Oral Daily Demetrios Loll, MD   40 mg at 03/30/17 0730  . midodrine (PROAMATINE) tablet 10 mg  10 mg Oral TID WC Loletha Grayer, MD   10 mg at 03/30/17 1306  . mometasone-formoterol (DULERA) 200-5 MCG/ACT inhaler 2 puff  2 puff Inhalation BID Demetrios Loll, MD   2 puff at 03/30/17 0730  . nystatin (MYCOSTATIN) 100000 UNIT/ML suspension 500,000 Units  5 mL Oral QID Loletha Grayer, MD   500,000 Units at 03/30/17 1309  . ondansetron (ZOFRAN) tablet 4 mg  4 mg Oral Q6H PRN Demetrios Loll, MD       Or  . ondansetron [facility]) injection 4 mg  4 mg Intravenous Q6H PRN Demetrios Loll, MD      . oxyCODONE (Oxy IR/ROXICODONE) immediate release tablet 10 mg  10 mg Oral Q6H PRN Demetrios Loll, MD   10 mg at 03/30/17 0606  . pseudoephedrine (SUDAFED) 12 hr tablet 120 mg  120 mg Oral Q12H PRN Demetrios Loll, MD      . senna-docusate (Senokot-S) tablet 1 tablet  1 tablet Oral QHS PRN Demetrios Loll, MD      . sucralfate (CARAFATE) tablet 1 g  1 g Oral BID Demetrios Loll, MD   1 g at 03/30/17 0730   Facility-Administered Medications Ordered in Other Encounters  Medication Dose Route Frequency Provider Last Rate Last Dose  . 0.9 %  sodium chloride infusion  250 mL Intravenous PRN Teodoro Spray, MD      . 0.9 %  sodium chloride infusion  250 mL Intravenous PRN Teodoro Spray, MD      . 0.9% sodium chloride infusion  1 mL/kg/hr Intravenous Continuous Teodoro Spray, MD      . 0.9% sodium chloride infusion  1 mL/kg/hr Intravenous Continuous Teodoro Spray, MD      . sodium chloride flush (NS) 0.9 % injection 3 mL  3 mL Intravenous Q12H Teodoro Spray, MD      . sodium chloride flush (NS) 0.9 % injection 3 mL  3 mL Intravenous PRN Teodoro Spray, MD      . sodium chloride flush (NS) 0.9 % injection 3 mL  3 mL Intravenous  Q12H  Teodoro Spray, MD      . sodium chloride flush (NS) 0.9 % injection 3 mL  3 mL Intravenous PRN Teodoro Spray, MD         Discharge Medications: Please see discharge summary for a list of discharge medications.  Relevant Imaging Results:  Relevant Lab Results:   Additional Information [phone]  Zettie Pho, LCSW

## 2017-03-30 NOTE — Progress Notes (Signed)
Initial Nutrition Assessment  DOCUMENTATION CODES:   Severe malnutrition in context of acute illness/injury  INTERVENTION:  Provide Ensure Enlive po TID, each supplement provides 350 kcal and 20 grams of protein. Encouraged patient to continue drinking his 350 kcal oral nutrition supplement TID at home.  Encouraged ongoing intake of adequate calories and protein to prevent further loss of body weight/lean body mass.  Will downgrade diet to dysphagia 3 with thin liquids per patient/family request. Patient will require assistance eating at meals where family members are not present.  NUTRITION DIAGNOSIS:   Malnutrition (Severe) related to acute illness (stg IV lung cancer) as evidenced by 8.5 percent weight loss over 3 months, moderate depletion of body fat, severe depletion of body fat, moderate depletions of muscle mass, severe depletion of muscle mass.  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   -Patient is followed by outpatient RD at cancer center. -Patient pending PMT c/s for goals of care.  Spoke with patient and his family members at bedside. Patient reports his appetite has been decreased for the past 3-4 months, but is actually improving lately. He reports eating 3-4 meals per day. He enjoys beef, pork, potatoes, and soft vegetables. Endorses some constipation. Denies any N/V, abdominal pain, or difficulty chewing/swallowing. Family members report that due to patient's shaking and weakness it is best if he receives his food chopped so he can get it onto his fork/spoon easier. They also  report he may require assistance with meals if they are not there. Patient reports he drinks 3 bottles of Ensure per day. Patient unsure which Ensure it is, but reports it has 350 kcal per bottle.  UBW was 165 lbs. Per chart patient was 165.5 lbs on 12/05/2016 and 151.4 lbs on 02/20/2017. During that time frame he lost 14.1 lbs (8.5% body weight) over 3 months, which is significant for time frame. He had gained up to 153.3 lbs on 03/20/2017. Current weight likely not a measured weight. Attempted to obtain bed scale weight but it was not zeroed correctly.  Meal Completion: 50-100%  Medications reviewed and include: acidophilus 1 capsule daily, Megace 40 mg daily, nystatin, Carafate.  Labs reviewed: Potassium 3.2, Chloride 118, Ferritin 541.  Nutrition-Focused physical exam completed. Findings are moderate-severe fat depletion, moderate-sever muscle depletion, and no edema.   Discussed with RN.  Diet Order:  Diet regular Room service appropriate? Yes; Fluid consistency: Thin  Skin:  Reviewed, no issues  Last BM:  PTA (03/27/2017 per chart)  Height:   Ht Readings from Last 1 Encounters:  03/28/17 6' (1.829 m)    Weight:   Wt Readings from Last 1 Encounters:  03/28/17 154 lb (69.9 kg)    Ideal Body Weight:  80.9 kg  BMI:  Body mass index is 20.89 kg/m.  Estimated Nutritional Needs:   Kcal:  1860-2140 (MSJ x 1.3-1.5)  Protein:  84-105 grams (1.2-1.5 grams/kg)  Fluid:  1.8 L/day (25 ml/kg)  EDUCATION NEEDS:   No education needs identified at this time  Willey Blade, MS, RD, LDN Pager: [phone] After Hours Pager: [phone]

## 2017-03-30 NOTE — Clinical Social Work Note (Addendum)
Clinical Social Work Assessment  Patient Details  Name: Noah Coleman MRN: [id number] Date of Birth: Aug 11, 1930  Date of referral:  03/30/17               Reason for consult:  Facility Placement                Permission sought to share information with:  Chartered certified accountant granted to share information::  Yes, Verbal Permission Granted  Name::        Agency::     Relationship::     Contact Information:     Housing/Transportation Living arrangements for the past 2 months:  Single Family Home Source of Information:  Patient, Friend/Neighbor, Other (Comment Required) Radiation protection practitioner) Patient Interpreter Needed:  None Criminal Activity/Legal Involvement Pertinent to Current Situation/Hospitalization:  No - Comment as needed Significant Relationships:  Warehouse manager, Other Family Members, Neighbor Lives with:  Self Do you feel safe going back to the place where you live?  Yes Need for family participation in patient care:  No (Coment)  Care giving concerns:  PT recommendation for STR   Social Worker assessment / plan:  CSW met with the patient at bedside. In attendance were his sister-in-law, Carlus Pavlov, his friend, Clare Gandy, and another visitor. The patient gave verbal permission to speak in front of his family. The CSW explained the referral process, and the patient verbalized permission for the CSW to make the referral for SNF. The patient indicated that his primary choice is [facility].  At baseline, the patient lives alone and is independent in all ADLs and most IADLs including driving. The patient has family and friend support, all of whom agree with the discharge plan. Date of discharge is unknown. CSW will follow up with bed offers as they arrive.  Employment status:  Retired Nurse, adult PT Recommendations:  [place] / Referral to community resources:  [facility]  Patient/Family's  Response to care:  The patient and his family thanked the CSW for assistance.  Patient/Family's Understanding of and Emotional Response to Diagnosis, Current Treatment, and Prognosis: The patient and his family understand the need for SNF level of care and are in agreement.  Emotional Assessment Appearance:  Appears stated age Attitude/Demeanor/Rapport:   (Pleasant) Affect (typically observed):  Calm, Pleasant Orientation:  Oriented to Self, Oriented to Place, Oriented to  Time, Oriented to Situation Alcohol / Substance use:  Never Used Psych involvement (Current and /or in the community):  No (Comment)  Discharge Needs  Concerns to be addressed:  Care Coordination, Discharge Planning Concerns Readmission within the last 30 days:  No Current discharge risk:  Lives alone Barriers to Discharge:  Continued Medical Work up   [employer], LCSW 03/30/2017, 1:45 PM

## 2017-03-31 ENCOUNTER — Ambulatory Visit: Payer: Medicare HMO | Admitting: Radiation Oncology

## 2017-03-31 DIAGNOSIS — C3490 Malignant neoplasm of unspecified part of unspecified bronchus or lung: Secondary | ICD-10-CM

## 2017-03-31 DIAGNOSIS — R42 Dizziness and giddiness: Secondary | ICD-10-CM

## 2017-03-31 DIAGNOSIS — Z7189 Other specified counseling: Secondary | ICD-10-CM

## 2017-03-31 DIAGNOSIS — Z515 Encounter for palliative care: Secondary | ICD-10-CM

## 2017-03-31 LAB — TYPE AND SCREEN
ABO/RH(D): O POS
ANTIBODY SCREEN: NEGATIVE
UNIT DIVISION: 0

## 2017-03-31 LAB — BPAM RBC
BLOOD PRODUCT EXPIRATION DATE: 201807202359
ISSUE DATE / TIME: 201807151248
Unit Type and Rh: 9500

## 2017-03-31 LAB — CBC
HCT: 23.3 % — ABNORMAL LOW (ref 40.0–52.0)
HEMOGLOBIN: 8.1 g/dL — AB (ref 13.0–18.0)
MCH: 36.8 pg — ABNORMAL HIGH (ref 26.0–34.0)
MCHC: 34.8 g/dL (ref 32.0–36.0)
MCV: 105.8 fL — ABNORMAL HIGH (ref 80.0–100.0)
PLATELETS: 133 10*3/uL — AB (ref 150–440)
RBC: 2.2 MIL/uL — AB (ref 4.40–5.90)
RDW: 14.6 % — ABNORMAL HIGH (ref 11.5–14.5)
WBC: 4.5 10*3/uL (ref 3.8–10.6)

## 2017-03-31 LAB — BASIC METABOLIC PANEL
Anion gap: 8 (ref 5–15)
BUN: 20 mg/dL (ref 6–20)
CHLORIDE: 116 mmol/L — AB (ref 101–111)
CO2: 16 mmol/L — ABNORMAL LOW (ref 22–32)
CREATININE: 0.61 mg/dL (ref 0.61–1.24)
Calcium: 7.9 mg/dL — ABNORMAL LOW (ref 8.9–10.3)
Glucose, Bld: 133 mg/dL — ABNORMAL HIGH (ref 65–99)
POTASSIUM: 4.4 mmol/L (ref 3.5–5.1)
SODIUM: 140 mmol/L (ref 135–145)

## 2017-03-31 MED ORDER — DEXAMETHASONE 4 MG PO TABS
10.0000 mg | ORAL_TABLET | Freq: Three times a day (TID) | ORAL | Status: DC
  Administered 2017-03-31 (×3): 10 mg via ORAL
  Filled 2017-03-31 (×3): qty 3

## 2017-03-31 MED ORDER — FINASTERIDE 5 MG PO TABS: 5.0000 mg | ORAL_TABLET | Freq: Every day | ORAL | 0 refills | Status: AC

## 2017-03-31 MED ORDER — GLYCOPYRROLATE 1 MG PO TABS
1.0000 mg | ORAL_TABLET | Freq: Three times a day (TID) | ORAL | Status: DC | PRN
  Filled 2017-03-31: qty 1

## 2017-03-31 MED ORDER — MIDODRINE HCL 10 MG PO TABS: 10.0000 mg | ORAL_TABLET | Freq: Three times a day (TID) | ORAL | 0 refills | Status: AC

## 2017-03-31 MED ORDER — ENSURE ENLIVE PO LIQD: 237.0000 mL | Freq: Three times a day (TID) | ORAL | 12 refills | Status: DC

## 2017-03-31 MED ORDER — DEXAMETHASONE 2 MG PO TABS: 10.0000 mg | ORAL_TABLET | Freq: Three times a day (TID) | ORAL | 0 refills | Status: DC

## 2017-03-31 MED ORDER — ALPRAZOLAM 0.5 MG PO TABS: 0.5000 mg | ORAL_TABLET | Freq: Two times a day (BID) | ORAL | Status: DC | PRN

## 2017-03-31 NOTE — Care Management Important Message (Signed)
Important Message  Patient Details  Name: Noah Coleman MRN: [id number] Date of Birth: [birth year]   Medicare Important Message Given:  Yes Signed IM notice given    Katrina Stack, RN 03/31/2017, 10:06 AM

## 2017-03-31 NOTE — Clinical Social Work Note (Addendum)
CSW presented bed offers, waiting for patient's decision on where he wants to go.  Patient and family have agreed to go to [facility].    CSW spoke to patient and his sister in law Delores Truitt [phone] and they would like to pursue short term rehab with palliative to follow.  CSW contacted [facility] who has begun insurance authorization awaiting approval for SNF.    Jones Broom. Norval Morton, MSW, [place]  03/31/2017 12:39 PM

## 2017-03-31 NOTE — Consult Note (Signed)
                                                                                 Consultation Note Date: 03/31/2017   Patient Name: Noah Coleman  DOB: 12/28/1929  MRN: [id number]  Age / Sex: 83 y.o., male  PCP: Miller, Mark F, MD Referring Physician: Patel, Sona, MD  Reason for Consultation: Establishing goals of care  HPI/Patient Profile: 83 y.o. male  with past medical history of stage 4 small cell lung cancer with mets to the brain and bone, and severe malnutrition who was admitted on 03/28/2017 with orthostatic hypotension and a syncopal episode.  PMT was consulted in order to help with goals of care - specifically whether or not to continue radiation treatments.  Clinical Assessment and Goals of Care:  I have reviewed medical records including EPIC notes, labs and imaging, received report from the care team, assessed the patient and then met at the bedside along with his sister in law, niece and close friend who help him make medical decisions-  to discuss diagnosis prognosis, GOC, EOL wishes, disposition and options.  I introduced Palliative Medicine as specialized medical care for people living with serious illness. It focuses on providing relief from the symptoms and stress of a serious illness. The goal is to improve quality of life for both the patient and the family.  We discussed a brief life review of the patient. 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  He became a widower last November.  He was living at home alone.  He has lost a great deal of weight recently and has little interest in eating. Natural disease trajectory and expectations at EOL were discussed.  I attempted to elicit values and goals of care important to the patient.  He does not want to be a burden although he realizes he is at the point where he will have to accept assistance.  He is focused on increased  quality of life.  The difference between aggressive medical intervention and comfort care was considered in light of the patient's goals of care.  After much discussion with all parties in the room the patient decided: 1.  Not to continue with Radiation therapy 2.  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  He stated very clearly that he wants to [facility] at end of life.  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  He is a great believer in [facility] at end of life.  Questions and concerns were addressed.  The family was encouraged to call with questions or concerns.     Primary Decision Maker:  PATIENT    SUMMARY OF RECOMMENDATIONS     Discharge to [facility] in long term care (rather than acute skilled care).  Start Hospice Services immediately at [facility].  No further chemo or   radiation  Agree with PRN Xanax for anxiety (will increase frequency to BID PRN)  Agree with decadron 10 mg TID for pain.  Would taper slowly to 4 mg daily now that pain is controlled.  Agree with Oxycodone 10 mg q 6 PRN however, would consider adding back Fentanyl TD if PRN dosing is not adequate or if Oxycodone drops his BP too much (I believe fentanyl has less effect on BP).  Added robinul PRN for excess secretions and mucus.   Code Status/Advance Care Planning:  DNR   Prognosis:   Weeks to months based on rapidly progressing small cell lung CA with mets to brain and bone.  No longer desires aggressive treatment.  Focused on comfort.  Discharge Planning: Skilled Nursing Facility with Hospice      Primary Diagnoses: Present on Admission: . Orthostatic hypertension . Orthostatic hypotension   I have reviewed the medical record, interviewed the patient and family, and examined the patient. The following  aspects are pertinent.  Past Medical History:  Diagnosis Date  . Anemia   . BPH (benign prostatic hyperplasia)   . Chronic idiopathic urticaria   . Diverticulosis   . Dupuytren's contracture of right hand   . History of hiatal hernia   . Hyperlipemia   . Lung cancer (HCC)   . Shingles    Social History   Social History  . Marital status: Widowed    Spouse name: N/A  . Number of children: N/A  . Years of education: N/A   Social History Main Topics  . Smoking status: Former Smoker    Years: 30.00    Types: Cigarettes    Quit date: 08/12/1981  . Smokeless tobacco: Never Used  . Alcohol use No  . Drug use: No  . Sexual activity: Not Asked   Other Topics Concern  . None   Social History Narrative  . None   Family History  Problem Relation Age of Onset  . Cancer Mother        unknown type  . Lung cancer Father   . Colon cancer Father    Scheduled Meds: . acidophilus  1 capsule Oral Daily  . aspirin EC  81 mg Oral Daily  . dexamethasone  10 mg Oral TID  . doxepin  10 mg Oral QHS  . enoxaparin (LOVENOX) injection  40 mg Subcutaneous Q24H  . feeding supplement (ENSURE ENLIVE)  237 mL Oral TID BM  . finasteride  5 mg Oral Daily  . linaclotide  145 mcg Oral QAC breakfast  . megestrol  40 mg Oral Daily  . midodrine  10 mg Oral TID WC  . mometasone-formoterol  2 puff Inhalation BID  . nystatin  5 mL Oral QID  . sucralfate  1 g Oral BID   Continuous Infusions: PRN Meds:.acetaminophen **OR** acetaminophen, albuterol, ALPRAZolam, bisacodyl, diphenhydrAMINE, docusate sodium, glycopyrrolate, hydrOXYzine, loratadine, LORazepam, ondansetron **OR** ondansetron (ZOFRAN) IV, oxyCODONE, pseudoephedrine, senna-docusate No Known Allergies Review of Systems Denies pain currently, complains of excess secretions.  Denies constipation, dyspnea.   Physical Exam Well developed elderly gentleman, Awake, Alert, orientated, coherent CV tachy Resp productive cough, no increased work of  breathing Abdomen soft NT, ND Extremities no edema noted  Vital Signs: BP (!) 153/73 (BP Location: Left Arm)   Pulse (!) 113   Temp 97.8 F (36.6 C) (Oral)   Resp 18   Ht 6' (1.829 m)   Wt 69.9 kg (154 lb)   SpO2 99%   BMI 20.89 kg/m  Pain   Assessment: No/denies pain   Pain Score: 0-No pain   SpO2: SpO2: 99 % O2 Device:SpO2: 99 % O2 Flow Rate: .   IO: Intake/output summary:  Intake/Output Summary (Last 24 hours) at 03/31/17 1527 Last data filed at 03/31/17 1300  Gross per 24 hour  Intake             1020 ml  Output             1700 ml  Net             -680 ml    LBM: Last BM Date: 03/30/17 Baseline Weight: Weight: 69.4 kg (153 lb) Most recent weight: Weight: 69.9 kg (154 lb)     Palliative Assessment/Data:   Flowsheet Rows     Most Recent Value  Intake Tab  Referral Department  Oncology  Unit at Time of Referral  Cardiac/Telemetry Unit  Palliative Care Primary Diagnosis  Cancer  Date Notified  03/30/17  Palliative Care Type  New Palliative care  Reason for referral  Clarify Goals of Care, Counsel Regarding Hospice  Date of Admission  03/28/17  Date first seen by Palliative Care  03/31/17  # of days Palliative referral response time  1 Day(s)  # of days IP prior to Palliative referral  2  Clinical Assessment  Palliative Performance Scale Score  30%  Psychosocial & Spiritual Assessment  Palliative Care Outcomes  Patient/Family meeting held?  Yes  Who was at the meeting?  patient, sister in law, niece, friend  Palliative Care Outcomes  Improved non-pain symptom therapy, Counseled regarding hospice, Clarified goals of care, Changed to focus on comfort      Time In: 1:00  Time Out: 2:10 Time Total: 70 min. Greater than 50%  of this time was spent counseling and coordinating care related to the above assessment and plan.  Signed by: Florentina Jenny, PA-C Palliative Medicine Pager: [phone]  Please contact Palliative Medicine Team phone at  [phone] for questions and concerns.  For individual provider: See Shea Evans

## 2017-03-31 NOTE — Progress Notes (Signed)
Radiation Oncology Follow up Note  Name: Noah Coleman   Date:   03/28/2017 MRN:  [id number] DOB: [birth year]    This 83 y.o. male seen in his hospital bed for palliative consult regarding metastatic disease to his cervical thoracic spine   REFERRING PROVIDER: No ref. provider found  HPI: Patient is a. 83 year old gentleman previously treated for brain metastasis on MRI scan of his brain demonstrated brain lesions from his known extensive stage small cell lung cancer. He was recently hospitalized with increasing dehydration orthostatic hypotension and syncope. As part of his workup MRI scan of the cervical spine was performed showing innumerable enhancing lesions throughout the cervical thoracic cord consistent with metastatic disease. Significant disease a T1 and T2. He is seen today in his hospital room he seems to be under good pain control. I been asked to evaluate her for possible radiation therapy. He does have disc protrusion no evidence of metastatic disease causing cord compression.  COMPLICATIONS OF TREATMENT: none  FOLLOW UP COMPLIANCE: keeps appointments   PHYSICAL EXAM:  BP (!) 141/71 (BP Location: Left Arm)   Pulse (!) 104   Temp 98 F (36.7 C) (Oral)   Resp 20   Ht 6' (1.829 m)   Wt 154 lb (69.9 kg)   SpO2 97%   BMI 20.89 kg/m  Frail-appearing elderly gentleman in NAD. Range of motion of his neck does not elicit pain. He does have some slight weakness in his right upper extremity greater than his left. Proprioception is intact. Range of motion of his neck does not elicit pain. Well-developed well-nourished patient in NAD. HEENT reveals PERLA, EOMI, discs not visualized.  Oral cavity is clear. No oral mucosal lesions are identified. Neck is clear without evidence of cervical or supraclavicular adenopathy. Lungs are clear to A&P. Cardiac examination is essentially unremarkable with regular rate and rhythm without murmur rub or thrill. Abdomen is benign with no organomegaly  or masses noted. Motor sensory and DTR levels are equal and symmetric in the upper and lower extremities. Cranial nerves II through XII are grossly intact. Proprioception is intact. No peripheral adenopathy or edema is identified. No motor or sensory levels are noted. Crude visual fields are within normal range.  RADIOLOGY RESULTS: MRI scans are reviewed  PLAN: At this time I have offered palliative radiation therapy to his thoracic cervical spine. I have talked with his family and they are opting to go with palliative care at this point. I think that's a viable and good option. I would be happy to reevaluate him at any time should palliative treatment be indicated or desired of his family.  I would like to take this opportunity to thank you for allowing me to participate in the care of your patient.Armstead Peaks., MD

## 2017-03-31 NOTE — Evaluation (Signed)
Occupational Therapy Evaluation Patient Details Name: Noah Coleman MRN: [id number] DOB: 05-Apr-1930 Today's Date: 03/31/2017    History of Present Illness 83yo male pt presented to ER secondary to fall on floor (remained x3 hours); admitted with syncopal episode related to orthostatic hypotension.   Clinical Impression   Pt seen for OT evaluation this date. Pt was independent with ADL and IADL prior to admission, living alone. Of note, pt's spouse passed away in 2017/01/23. Pt presents with generalized weakness, decreased BUE ROM due to metastatic cancer, recent fall likely due to orthostatic hypotension, and decreased activity tolerance. Pt will benefit from skilled OT services to address noted impairments and functional deficits in bathing, dressing, and functional mobility, including education/training in AE/DME, education/training in energy conservation strategies and falls prevention, and home/routines modifications to support safety and maximize functional independence and quality of life. Recommend STR following hospitalization.     Follow Up Recommendations  SNF    Equipment Recommendations  3 in 1 bedside commode;Other (comment) (LH sponge, LH shoe horn, reacher sock aid, terry cloth bathrobe)    Recommendations for Other Services       Precautions / Restrictions Precautions Precautions: Fall Restrictions Weight Bearing Restrictions: No      Mobility Bed Mobility               General bed mobility comments: deferred due to pt fatigue  Transfers                 General transfer comment: deferred due to pt fatigue    Balance                                           ADL either performed or assessed with clinical judgement   ADL Overall ADL's : Needs assistance/impaired Eating/Feeding: Sitting;Set up   Grooming: Sitting;Set up   Upper Body Bathing: Sitting;Minimal assistance   Lower Body Bathing: Minimal assistance;Moderate  assistance;Sitting/lateral leans;Sit to/from stand;With adaptive equipment   Upper Body Dressing : Set up;Minimal assistance;Sitting Upper Body Dressing Details (indicate cue type and reason): pt educated in types of clothing to minimize pain in shoulders during UB dressing Lower Body Dressing: Sitting/lateral leans;Sit to/from stand;Moderate assistance;With adaptive equipment Lower Body Dressing Details (indicate cue type and reason): pt educated in AE for LB dressing tasks to minimize bending over and support energy conservation and falls prevention   Toilet Transfer Details (indicate cue type and reason): deferred due to pt fatigue           General ADL Comments: pt generally min-mod assist for LB ADL, would benefit from additional education/training in AE for seated ADL tasks to support functional independence, energy conservation, and minimize risk of falls due to orthostatic hypotension     Vision Baseline Vision/History: Wears glasses Wears Glasses: At all times (to correct for double vision) Patient Visual Report: No change from baseline Vision Assessment?: No apparent visual deficits     Perception     Praxis      Pertinent Vitals/Pain Pain Assessment: No/denies pain     Hand Dominance     Extremity/Trunk Assessment Upper Extremity Assessment Upper Extremity Assessment: Generalized weakness (gen weak bilaterally, pain and limited shoulder ROM bilaterally secondary to cancer)   Lower Extremity Assessment Lower Extremity Assessment: Generalized weakness   Cervical / Trunk Assessment Cervical / Trunk Assessment: Normal   Communication Communication Communication: No difficulties  Cognition Arousal/Alertness: Awake/alert Behavior During Therapy: WFL for tasks assessed/performed Overall Cognitive Status: Within Functional Limits for tasks assessed                                     General Comments       Exercises Other Exercises Other  Exercises: pt educated in energy conservation strategies including body positioning, AE/DME, and compensatory strategies to support safety and functional independence with ADL; handout provided, pt verbalized understanding, thankful for education Other Exercises: pt educated in falls prevention strategies as related to orthostatic hypotension, pt verbalized understanding   Shoulder Instructions      Home Living Family/patient expects to be discharged to:: Private residence Living Arrangements: Alone Available Help at Discharge: Friend(s);Available PRN/intermittently Type of Home: House Home Access: Level entry     Home Layout: One level               Home Equipment: None          Prior Functioning/Environment Level of Independence: Independent        Comments: Indep with ADLs, household and community mobility without assist device; + driving; denies fall history (except for fall that led to this hospitalization). Of note, pt shared that his spouse passed away in Jan 06, 2023.        OT Problem List: Decreased strength;Cardiopulmonary status limiting activity;Decreased range of motion;Decreased activity tolerance;Impaired UE functional use;Decreased knowledge of use of DME or AE      OT Treatment/Interventions: Self-care/ADL training;Therapeutic exercise;Therapeutic activities;Energy conservation;DME and/or AE instruction;Patient/family education    OT Goals(Current goals can be found in the care plan section) Acute Rehab OT Goals Patient Stated Goal: get better OT Goal Formulation: With patient ADL Goals Additional ADL Goal #1: Pt will verbalize plan for implementing at least 1 learned ECS to support safety and independence with ADL.  Additional ADL Goal #2: Pt will verbalize understanding of education provided about body positioning during ADL to minimize risk of OH/falls.  OT Frequency: Min 2X/week   Barriers to D/C: Decreased caregiver support          Co-evaluation               AM-PAC PT "6 Clicks" Daily Activity     Outcome Measure Help from another person eating meals?: None Help from another person taking care of personal grooming?: None Help from another person toileting, which includes using toliet, bedpan, or urinal?: A Lot Help from another person bathing (including washing, rinsing, drying)?: A Lot Help from another person to put on and taking off regular upper body clothing?: A Little Help from another person to put on and taking off regular lower body clothing?: A Lot 6 Click Score: 17   End of Session    Activity Tolerance: Patient tolerated treatment well (pt somewhat fatigued but generally in good spirits) Patient left: in bed;with call bell/phone within reach;with bed alarm set  OT Visit Diagnosis: Other abnormalities of gait and mobility (R26.89);Muscle weakness (generalized) (M62.81);History of falling (Z91.81)                Time: 1350-1410 OT Time Calculation (min): 20 min Charges:  OT General Charges $OT Visit: 1 Procedure OT Evaluation $OT Eval Low Complexity: 1 Procedure OT Treatments $Self Care/Home Management : 8-22 mins G-Codes:     Jeni Salles, MPH, MS, OTR/L ascom [phone] 03/31/17, 2:27 PM

## 2017-03-31 NOTE — Plan of Care (Signed)
Problem: Health Behavior/Discharge Planning: Goal: Ability to manage health-related needs will improve Outcome: Progressing Pt to go to [facility] when  Ins. authorizes

## 2017-03-31 NOTE — Discharge Summary (Addendum)
[place] [facility] [place] NAME: Noah Coleman    MR#:  [id number]  DATE OF BIRTH:  1930/06/22  DATE OF ADMISSION:  03/28/2017 ADMITTING PHYSICIAN: Demetrios Loll, MD  DATE OF DISCHARGE: 03/31/17  PRIMARY CARE PHYSICIAN: Rusty Aus, MD    ADMISSION DIAGNOSIS:  Syncope and collapse [R55] Dehydration [E86.0] Orthostatic dizziness [R42]  DISCHARGE DIAGNOSIS:  Syncope due to orthostatic hypotension---now on po Midodrine Metastatic lung cancer (brain and cervical spine)  SECONDARY DIAGNOSIS:   Past Medical History:  Diagnosis Date  . Anemia   . BPH (benign prostatic hyperplasia)   . Chronic idiopathic urticaria   . Diverticulosis   . Dupuytren's contracture of right hand   . History of hiatal hernia   . Hyperlipemia   . Lung cancer ([place])   . Shingles     HOSPITAL COURSE:   Noah Coleman  is a 83 y.o. male with a known history of Stage IV lung cancer, anemia, BPH, Chronic idiopathic urticaria and diverticulosis. The patient was sent to ED due to fall on the floor for 3 hours this morning. He was found orthostatic hypotension and dehydration  1. Hypotension with orthostatic hypotension./Syncope. Cont   midodrine to 10 mg 3 times a day. Stop IV fluids with drop in hemoglobin. 2. Anemia. Today's hemoglobin 5.8. Transfuse 1 unit of packed red blood cells ---hgb 8.1  3. Metastatic lung cancer to brain and cervical spine and thoracic spine with invasion of the cord. Weakness on the right side today. Now on po Decadron 10 mg every 8 hours. Palliative care consultation. Radiation oncology consultation as out pt after d/w further options with Dr Derry Lory. - MRI of the brain shows a new focus. Patient is a DO NOT RESUSCITATE. Overall prognosis is poor.  4. Generalized itching. Not seeing a rash. Trial of Atarax.  5. Chronic pain  Cont oral pain meds oxycodone as needed  6. Thrush start nystatin swish and swallow  7. Urinary retention.  Foley catheter placed. Started finasteride. Unable to give alpha blockers with orthostatic hypotension.   8. Weakness. Physical therapy recommended rehabilitation.  9. D/w dter and wife and they understand poor prognosis. They prefer not to do any more treatment including raditaion. Will defer further discussion with Dr Grayland Ormond.  D/c to rehab once insurance auth obtained  CONSULTS OBTAINED:  Treatment Team:  Noreene Filbert, MD  DRUG ALLERGIES:  No Known Allergies  DISCHARGE MEDICATIONS:   Current Discharge Medication List    START taking these medications   Details  dexamethasone (DECADRON) 2 MG tablet Take 5 tablets (10 mg total) by mouth 3 (three) times daily. Qty: 30 tablet, Refills: 0    feeding supplement, ENSURE ENLIVE, (ENSURE ENLIVE) LIQD Take 237 mLs by mouth 3 (three) times daily between meals. Qty: 237 mL, Refills: 12    finasteride (PROSCAR) 5 MG tablet Take 1 tablet (5 mg total) by mouth daily. Qty: 30 tablet, Refills: 0    midodrine (PROAMATINE) 10 MG tablet Take 1 tablet (10 mg total) by mouth 3 (three) times daily with meals. Qty: 90 tablet, Refills: 0      CONTINUE these medications which have NOT CHANGED   Details  aspirin EC 81 MG tablet Take 1 tablet by mouth 1 day or 1 dose.    doxepin (SINEQUAN) 10 MG capsule Take 10 mg by mouth at bedtime.    hydrOXYzine (ATARAX/VISTARIL) 25 MG tablet TAKE 1/2 TO 1 TABLET BY MOUTH TWICE DAILY AS NEEDED  FOR ITCHING    linaclotide (LINZESS) 145 MCG CAPS capsule Take 145 mcg by mouth.    megestrol (MEGACE) 40 MG tablet TAKE ONE TABLET EVERY DAY Qty: 30 tablet, Refills: 2   Associated Diagnoses: Small cell lung cancer, left (HCC)    Multiple Vitamin (MULTIVITAMIN) capsule Take 1 capsule by mouth daily.    Probiotic Product (PROBIOTIC DAILY) CAPS Take 1 capsule by mouth daily.    triamcinolone cream (KENALOG) 0.1 % Apply 1 application topically 2 (two) times daily.    ALPRAZolam (XANAX) 0.5 MG tablet Take 1  tablet (0.5 mg total) by mouth daily. Take 1 hour before radiation Qty: 10 tablet, Refills: 0    Cyanocobalamin (RA VITAMIN B-12 TR) 1000 MCG TBCR Take 1 tablet by mouth 1 day or 1 dose.    diphenhydrAMINE (BENADRYL) 25 mg capsule Take 25 mg by mouth every 6 (six) hours as needed.    docusate sodium (COLACE) 100 MG capsule Take 1 capsule (100 mg total) by mouth 2 (two) times daily. Qty: 60 capsule, Refills: 2    Fluticasone-Salmeterol (ADVAIR DISKUS) 250-50 MCG/DOSE AEPB Inhale 1 puff into the lungs 2 (two) times daily.    Associated Diagnoses: Small cell lung cancer, left (HCC)    loratadine-pseudoephedrine (CLARITIN-D 24-HOUR) 10-240 MG 24 hr tablet Take 1 tablet by mouth daily.    Oxycodone HCl 10 MG TABS Take 1-2 tablets (10-20 mg total) by mouth every 6 (six) hours as needed. 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 Dissolve in 2-3 tbsp warm water, swish and swallow Qty: 60 tablet, Refills: 3        If you experience worsening of your admission symptoms, develop shortness of breath, life threatening emergency, suicidal or homicidal thoughts you must seek medical attention immediately by calling 911 or calling your MD immediately  if symptoms less severe.  You Must read complete instructions/literature along with all the possible adverse reactions/side effects for all the Medicines you take and that have been prescribed to you. Take any new Medicines after you have completely understood and accept all the possible adverse reactions/side effects.   Please note  You were cared for by a hospitalist  during your hospital stay. If you have any questions about your discharge medications or the care you received while you were in the hospital after you are discharged, you can call the unit and asked to speak with the hospitalist on call if the hospitalist that took care of you is not available. Once you are discharged, your primary care physician will handle any further medical issues. Please note that NO REFILLS for any discharge medications will be authorized once you are discharged, as it is imperative that you return to your primary care physician (or establish a relationship with a primary care physician if you do not have one) for your aftercare needs so that they can reassess your need for medications and monitor your lab values. Today   SUBJECTIVE   No new complaints  VITAL SIGNS:  Blood pressure (!) 141/71, pulse (!) 104, temperature 98 F (36.7 C), temperature source  Oral, resp. rate 20, height 6' (1.829 m), weight 69.9 kg (154 lb), SpO2 97 %.  I/O:   Intake/Output Summary (Last 24 hours) at 03/31/17 1050 Last data filed at 03/31/17 1014  Gross per 24 hour  Intake              780 ml  Output             1700 ml  Net             -920 ml    PHYSICAL EXAMINATION:  GENERAL:  83 y.o.-year-old patient lying in the bed with no acute distress. weak EYES: Pupils equal, round, reactive to light and accommodation. No scleral icterus. Extraocular muscles intact.  HEENT: Head atraumatic, normocephalic. Oropharynx and nasopharynx clear.  NECK:  Supple, no jugular venous distention. No thyroid enlargement, no tenderness.  LUNGS: Normal breath sounds bilaterally, no wheezing, rales,rhonchi or crepitation. No use of accessory muscles of respiration.  CARDIOVASCULAR: S1, S2 normal. No murmurs, rubs, or gallops.  ABDOMEN: Soft, non-tender, non-distended. Bowel sounds present. No organomegaly or mass.  EXTREMITIES: No pedal edema, cyanosis, or clubbing.  NEUROLOGIC: Cranial nerves II through XII  are intact. Muscle strength 4/5 in all extremities. Sensation intact. Gait not checked.  PSYCHIATRIC: The patient is alert and oriented x 3.  SKIN: No obvious rash, lesion, or ulcer.   DATA REVIEW:   CBC   Recent Labs Lab 03/31/17 0430  WBC 4.5  HGB 8.1*  HCT 23.3*  PLT 133*    Chemistries   Recent Labs Lab 03/28/17 0846 03/28/17 1421  03/31/17 0430  NA 137  --   < > 140  K 4.4  --   < > 4.4  CL 110  --   < > 116*  CO2 18*  --   < > 16*  GLUCOSE 122*  --   < > 133*  BUN 26*  --   < > 20  CREATININE 1.19  --   < > 0.61  CALCIUM 8.4*  --   < > 7.9*  MG 2.4 2.5*  --   --   AST 37  --   --   --   ALT 22  --   --   --   ALKPHOS 37*  --   --   --   BILITOT 0.8  --   --   --   < > = values in this interval not displayed.  Microbiology Results   No results found for this or any previous visit (from the past 240 hour(s)).  RADIOLOGY:  Mr Cervical Spine W Wo Contrast  Result Date: 03/30/2017 CLINICAL DATA:  Patient with a history of stage IV lung carcinoma who became unresponsive 03/28/2017. Abnormal appearance of the upper cervical cord brain MRI 03/28/2017. EXAM: MRI CERVICAL SPINE WITHOUT AND WITH CONTRAST TECHNIQUE: Multiplanar and multiecho pulse sequences of the cervical spine, to include the craniocervical junction and cervicothoracic junction, were obtained without and with intravenous contrast. CONTRAST:  15 ml MULTIHANCE GADOBENATE DIMEGLUMINE 529 MG/ML IV SOLN COMPARISON:  Brain MRI 03/28/2017. FINDINGS: Alignment: Maintained. Vertebrae: Vertebral body height is maintained. Metastatic deposits are seen in the T1 and T2 vertebral bodies. Lesion in the T1 vertebral body measures 1 cm AP on image 11 of series 4. Cord: Innumerable enhancing lesions are seen throughout the imaged cord consistent with metastatic disease. Index lesion posterior to C5-6 measures 0.7 cm AP on image 9 of series 15. Posterior Fossa, vertebral arteries, paraspinal tissues:  Negative. Disc levels:  Overall mild cervical spondylosis is most notable at C4-5 where a central disc protrusion contacts the ventral cord. IMPRESSION: Innumerable enhancing lesions throughout the visualized cervical and thoracic cord consistent with metastatic disease. Osseous metastases are also seen and best visualized in the T1 and T2 vertebral bodies. Electronically Signed   By: Inge Rise M.D.   On: 03/30/2017 12:48     Management plans discussed with the patient, family and they are in agreement.  CODE STATUS:     Code Status Orders        Start     Ordered   03/28/17 1355  Do not attempt resuscitation (DNR)  Continuous    Question Answer Comment  In the event of cardiac or respiratory ARREST Do not call a "code blue"   In the event of cardiac or respiratory ARREST Do not perform Intubation, CPR, defibrillation or ACLS   In the event of cardiac or respiratory ARREST Use medication by any route, position, wound care, and other measures to relive pain and suffering. May use oxygen, suction and manual treatment of airway obstruction as needed for comfort.      03/28/17 1354    Code Status History    Date Active Date Inactive Code Status Order ID Comments User Context   This patient has a current code status but no historical code status.    Advance Directive Documentation     Most Recent Value  Type of Advance Directive  Healthcare Power of Attorney, Living will  Pre-existing out of facility DNR order (yellow form or pink MOST form)  -  "MOST" Form in Place?  -      TOTAL TIME TAKING CARE OF THIS PATIENT:40 minutes.    Bryna Razavi M.D on 03/31/2017 at 10:50 AM  Between 7am to 6pm - Pager - [phone] After 6pm go to [email or web address] - password [email or web address] [facility]  Office  [phone]  CC: Primary care physician; Rusty Aus, MD

## 2017-04-01 ENCOUNTER — Ambulatory Visit: Payer: Medicare HMO

## 2017-04-01 ENCOUNTER — Encounter
Admission: RE | Admit: 2017-04-01 | Discharge: 2017-04-01 | Disposition: A | Discharge status: Home / Self Care | Payer: Medicare HMO | Source: Ambulatory Visit | Attending: Internal Medicine | Admitting: Internal Medicine

## 2017-04-01 MED ORDER — DEXAMETHASONE 4 MG PO TABS: 4.0000 mg | ORAL_TABLET | Freq: Three times a day (TID) | ORAL | 0 refills | Status: DC

## 2017-04-01 MED ORDER — DEXAMETHASONE 4 MG PO TABS
4.0000 mg | ORAL_TABLET | Freq: Three times a day (TID) | ORAL | Status: DC
  Administered 2017-04-01: 4 mg via ORAL
  Filled 2017-04-01: qty 1

## 2017-04-01 NOTE — Progress Notes (Addendum)
Report called to [facility] at [facility] at [place] [phone]). DNR in packet. EMS to transport.

## 2017-04-01 NOTE — Clinical Social Work Note (Addendum)
CSW was informed that patient can now go to [facility] with palliative to follow.  CSW made referral to [facility] Nurse [facility].  CSW was informed that [facility] can accept patient today.  Patient to be d/c'ed today to [facility].  Patient and family agreeable to plans will transport via ems RN to call report.  Jones Broom. Norval Morton, MSW, [place]  04/01/2017 12:35 PM

## 2017-04-01 NOTE — Progress Notes (Signed)
EMS called. Patient is packed & ready for discharge.

## 2017-04-01 NOTE — Progress Notes (Signed)
New referral for Palliative services at [facility]/Short term rehab received from [place]. Plan is for discharge today. Patient information faxed to referral. Thank you. Flo Shanks RN, BSN, [place] and [facility] [place], [facility] [phone] c

## 2017-04-01 NOTE — Care Management Note (Signed)
Case Management Note  Patient Details  Name: Noah Coleman MRN: [id number] Date of Birth: 02-05-1930  Subjective/Objective:                 DC to SNF as facilitated by CSW.    Action/Plan:   Expected Discharge Date:  04/01/17               Expected Discharge Plan:  Skilled Nursing Facility  In-House Referral:  Clinical Social Work  Discharge planning Services  CM Consult  Post Acute Care Choice:    Choice offered to:     DME Arranged:    DME Agency:     HH Arranged:    [place] Agency:     Status of Service:  Completed, signed off  If discussed at [employer] [facility] [employer], dates discussed:    Additional Comments:  Carles Collet, RN 04/01/2017, 1:30 PM

## 2017-04-01 NOTE — Progress Notes (Signed)
Pt continues to have urinary retention.  States that "he feels like he's going to bust", but nothing comes out.  This nurse has tried several things, but bladder scan shows 763ml.  Paged MD.  New order for foley catheter.  Inserted at midnight.  Tolerated well.     Urology consult in.

## 2017-04-01 NOTE — Progress Notes (Signed)
Pt  Discharged  At this time  Via ems to [facility]. No distress or c/o.

## 2017-04-01 NOTE — Discharge Summary (Signed)
[facility] NAME: Noah Coleman    MR#:  [id number]  DATE OF BIRTH:  1930/07/01  DATE OF ADMISSION:  03/28/2017 ADMITTING PHYSICIAN: Demetrios Loll, MD  DATE OF DISCHARGE: 04/01/17  PRIMARY CARE PHYSICIAN: Rusty Aus, MD    ADMISSION DIAGNOSIS:  Syncope and collapse [R55] Dehydration [E86.0] Orthostatic dizziness [R42]  DISCHARGE DIAGNOSIS:  Syncope due to orthostatic hypotension---now on po Midodrine Metastatic lung cancer (brain and cervical spine)  SECONDARY DIAGNOSIS:   Past Medical History:  Diagnosis Date  . Anemia   . BPH (benign prostatic hyperplasia)   . Chronic idiopathic urticaria   . Diverticulosis   . Dupuytren's contracture of right hand   . History of hiatal hernia   . Hyperlipemia   . Lung cancer ([place])   . Shingles     HOSPITAL COURSE:   Noah Coleman  is a 83 y.o. male with a known history of Stage IV lung cancer, anemia, BPH, Chronic idiopathic urticaria and diverticulosis. The patient was sent to ED due to fall on the floor for 3 hours this morning. He was found orthostatic hypotension and dehydration  1. Hypotension with orthostatic hypotension./Syncope. Cont   midodrine to 4 bmg 3 times a day.  2. Anemia. 5.8. Transfuse 1 unit of packed red blood cells ---hgb 8.1  3. Metastatic lung cancer to brain and cervical spine and thoracic spine with invasion of the cord. Weakness on the right side today. Now on po Decadron 10 mg every 8 hours. Palliative care consultation appreciated. Radiation oncology consultation as out pt after d/w further options with Dr Derry Lory. - MRI of the brain shows a new focus. Patient is a DO NOT RESUSCITATE.   4. Generalized itching. Not seeing a rash. Trial of Atarax.  5. Chronic pain  Cont oral pain meds oxycodone as needed  6. Thrush start nystatin swish and swallow  7. Urinary retention. Foley catheter placed. Started finasteride. Unable to give alpha blockers  with orthostatic hypotension. F/u Urology at out pt  8. Weakness. Physical therapy recommended rehabilitation.  9. D/w dter and wife and they understand poor prognosis. They prefer not to do any more treatment including raditaion. Will defer further discussion with Dr Grayland Ormond.  D/c to [facility] today with Palliative care to follow  CONSULTS OBTAINED:  Treatment Team:  Noreene Filbert, MD  DRUG ALLERGIES:  No Known Allergies  DISCHARGE MEDICATIONS:   Current Discharge Medication List    START taking these medications   Details  dexamethasone (DECADRON) 4 MG tablet Take 1 tablet (4 mg total) by mouth 3 (three) times daily. Qty: 30 tablet, Refills: 0    feeding supplement, ENSURE ENLIVE, (ENSURE ENLIVE) LIQD Take 237 mLs by mouth 3 (three) times daily between meals. Qty: 237 mL, Refills: 12    finasteride (PROSCAR) 5 MG tablet Take 1 tablet (5 mg total) by mouth daily. Qty: 30 tablet, Refills: 0    midodrine (PROAMATINE) 10 MG tablet Take 1 tablet (10 mg total) by mouth 3 (three) times daily with meals. Qty: 90 tablet, Refills: 0      CONTINUE these medications which have NOT CHANGED   Details  aspirin EC 81 MG tablet Take 1 tablet by mouth 1 day or 1 dose.    doxepin (SINEQUAN) 10 MG capsule Take 10 mg by mouth at bedtime.    hydrOXYzine (ATARAX/VISTARIL) 25 MG tablet TAKE 1/2 TO 1 TABLET BY MOUTH TWICE DAILY AS NEEDED FOR ITCHING  linaclotide (LINZESS) 145 MCG CAPS capsule Take 145 mcg by mouth.    megestrol (MEGACE) 40 MG tablet TAKE ONE TABLET EVERY DAY Qty: 30 tablet, Refills: 2   Associated Diagnoses: Small cell lung cancer, left (HCC)    Multiple Vitamin (MULTIVITAMIN) capsule Take 1 capsule by mouth daily.    Probiotic Product (PROBIOTIC DAILY) CAPS Take 1 capsule by mouth daily.    triamcinolone cream (KENALOG) 0.1 % Apply 1 application topically 2 (two) times daily.    ALPRAZolam (XANAX) 0.5 MG tablet Take 1 tablet (0.5 mg total) by mouth daily. Take 1  hour before radiation Qty: 10 tablet, Refills: 0    Cyanocobalamin (RA VITAMIN B-12 TR) 1000 MCG TBCR Take 1 tablet by mouth 1 day or 1 dose.    diphenhydrAMINE (BENADRYL) 25 mg capsule Take 25 mg by mouth every 6 (six) hours as needed.    docusate sodium (COLACE) 100 MG capsule Take 1 capsule (100 mg total) by mouth 2 (two) times daily. Qty: 60 capsule, Refills: 2    Fluticasone-Salmeterol (ADVAIR DISKUS) 250-50 MCG/DOSE AEPB Inhale 1 puff into the lungs 2 (two) times daily.    Associated Diagnoses: Small cell lung cancer, left (HCC)    loratadine-pseudoephedrine (CLARITIN-D 24-HOUR) 10-240 MG 24 hr tablet Take 1 tablet by mouth daily.    Oxycodone HCl 10 MG TABS Take 1-2 tablets (10-20 mg total) by mouth every 6 (six) hours as needed. 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 Dissolve in 2-3 tbsp warm water, swish and swallow Qty: 60 tablet, Refills: 3        If you experience worsening of your admission symptoms, develop shortness of breath, life threatening emergency, suicidal or homicidal thoughts you must seek medical attention immediately by calling 911 or calling your MD immediately  if symptoms less severe.  You Must read complete instructions/literature along with all the possible adverse reactions/side effects for all the Medicines you take and that have been prescribed to you. Take any new Medicines after you have completely understood and accept all the possible adverse reactions/side effects.   Please note  You were cared for by a hospitalist during your hospital stay. If you have any  questions about your discharge medications or the care you received while you were in the hospital after you are discharged, you can call the unit and asked to speak with the hospitalist on call if the hospitalist that took care of you is not available. Once you are discharged, your primary care physician will handle any further medical issues. Please note that NO REFILLS for any discharge medications will be authorized once you are discharged, as it is imperative that you return to your primary care physician (or establish a relationship with a primary care physician if you do not have one) for your aftercare needs so that they can reassess your need for medications and monitor your lab values. Today   SUBJECTIVE   No new complaints  VITAL SIGNS:  Blood pressure 133/73, pulse (!) 105, temperature (!) 97.3 F (36.3 C), temperature source Oral, resp. rate 20, height  6' (1.829 m), weight 69.9 kg (154 lb), SpO2 100 %.  I/O:    Intake/Output Summary (Last 24 hours) at 04/01/17 1224 Last data filed at 04/01/17 1100  Gross per 24 hour  Intake              720 ml  Output              600 ml  Net              120 ml    PHYSICAL EXAMINATION:  GENERAL:  83 y.o.-year-old patient lying in the bed with no acute distress. weak EYES: Pupils equal, round, reactive to light and accommodation. No scleral icterus. Extraocular muscles intact.  HEENT: Head atraumatic, normocephalic. Oropharynx and nasopharynx clear.  NECK:  Supple, no jugular venous distention. No thyroid enlargement, no tenderness.  LUNGS: Normal breath sounds bilaterally, no wheezing, rales,rhonchi or crepitation. No use of accessory muscles of respiration.  CARDIOVASCULAR: S1, S2 normal. No murmurs, rubs, or gallops.  ABDOMEN: Soft, non-tender, non-distended. Bowel sounds present. No organomegaly or mass.  EXTREMITIES: No pedal edema, cyanosis, or clubbing.  NEUROLOGIC: Cranial nerves II through XII are intact. Muscle strength 4/5 in  all extremities. Sensation intact. Gait not checked.  PSYCHIATRIC: The patient is alert and oriented x 3.  SKIN: No obvious rash, lesion, or ulcer.   DATA REVIEW:   CBC   Recent Labs Lab 03/31/17 0430  WBC 4.5  HGB 8.1*  HCT 23.3*  PLT 133*    Chemistries   Recent Labs Lab 03/28/17 0846 03/28/17 1421  03/31/17 0430  NA 137  --   < > 140  K 4.4  --   < > 4.4  CL 110  --   < > 116*  CO2 18*  --   < > 16*  GLUCOSE 122*  --   < > 133*  BUN 26*  --   < > 20  CREATININE 1.19  --   < > 0.61  CALCIUM 8.4*  --   < > 7.9*  MG 2.4 2.5*  --   --   AST 37  --   --   --   ALT 22  --   --   --   ALKPHOS 37*  --   --   --   BILITOT 0.8  --   --   --   < > = values in this interval not displayed.  Microbiology Results   No results found for this or any previous visit (from the past 240 hour(s)).  RADIOLOGY:  Mr Cervical Spine W Wo Contrast  Result Date: 03/30/2017 CLINICAL DATA:  Patient with a history of stage IV lung carcinoma who became unresponsive 03/28/2017. Abnormal appearance of the upper cervical cord brain MRI 03/28/2017. EXAM: MRI CERVICAL SPINE WITHOUT AND WITH CONTRAST TECHNIQUE: Multiplanar and multiecho pulse sequences of the cervical spine, to include the craniocervical junction and cervicothoracic junction, were obtained without and with intravenous contrast. CONTRAST:  15 ml MULTIHANCE GADOBENATE DIMEGLUMINE 529 MG/ML IV SOLN COMPARISON:  Brain MRI 03/28/2017. FINDINGS: Alignment: Maintained. Vertebrae: Vertebral body height is maintained. Metastatic deposits are seen in the T1 and T2 vertebral bodies. Lesion in the T1 vertebral body measures 1 cm AP on image 11 of series 4. Cord: Innumerable enhancing lesions are seen throughout the imaged cord consistent with metastatic disease. Index lesion posterior to C5-6 measures 0.7 cm AP on image 9 of series 15. Posterior Fossa, vertebral arteries, paraspinal tissues: Negative. Disc  levels: Overall mild cervical spondylosis is  most notable at C4-5 where a central disc protrusion contacts the ventral cord. IMPRESSION: Innumerable enhancing lesions throughout the visualized cervical and thoracic cord consistent with metastatic disease. Osseous metastases are also seen and best visualized in the T1 and T2 vertebral bodies. Electronically Signed   By: Inge Rise M.D.   On: 03/30/2017 12:48     Management plans discussed with the patient, family and they are in agreement.  CODE STATUS:     Code Status Orders        Start     Ordered   03/28/17 1355  Do not attempt resuscitation (DNR)  Continuous    Question Answer Comment  In the event of cardiac or respiratory ARREST Do not call a "code blue"   In the event of cardiac or respiratory ARREST Do not perform Intubation, CPR, defibrillation or ACLS   In the event of cardiac or respiratory ARREST Use medication by any route, position, wound care, and other measures to relive pain and suffering. May use oxygen, suction and manual treatment of airway obstruction as needed for comfort.      03/28/17 1354    Code Status History    Date Active Date Inactive Code Status Order ID Comments User Context   This patient has a current code status but no historical code status.    Advance Directive Documentation     Most Recent Value  Type of Advance Directive  Healthcare Power of Attorney, Living will  Pre-existing out of facility DNR order (yellow form or pink MOST form)  -  "MOST" Form in Place?  -      TOTAL TIME TAKING CARE OF THIS PATIENT:40 minutes.    Kamariah Fruchter M.D on 04/01/2017 at 12:24 PM  Between 7am to 6pm - Pager - [phone] After 6pm go to [email or web address] - password [email or web address]  Office  [phone]  CC: Primary care physician; Rusty Aus, MD

## 2017-04-07 ENCOUNTER — Inpatient Hospital Stay: Payer: Medicare HMO | Admitting: Oncology

## 2017-04-11 ENCOUNTER — Non-Acute Institutional Stay (SKILLED_NURSING_FACILITY): Payer: Medicare Other | Admitting: Gerontology

## 2017-04-11 DIAGNOSIS — C7931 Secondary malignant neoplasm of brain: Secondary | ICD-10-CM | POA: Diagnosis not present

## 2017-04-11 DIAGNOSIS — C3492 Malignant neoplasm of unspecified part of left bronchus or lung: Secondary | ICD-10-CM | POA: Diagnosis not present

## 2017-04-14 ENCOUNTER — Other Ambulatory Visit: Payer: Self-pay

## 2017-04-14 MED ORDER — LORAZEPAM 0.5 MG PO TABS: 0.5000 mg | ORAL_TABLET | ORAL | 3 refills | Status: DC | PRN

## 2017-04-14 MED ORDER — LORAZEPAM 0.5 MG PO TABS: 0.5000 mg | ORAL_TABLET | Freq: Every day | ORAL | 3 refills | Status: DC

## 2017-04-14 NOTE — Telephone Encounter (Signed)
Rx sent to [facility] phone : [phone] , fax : [phone]  

## 2017-04-15 ENCOUNTER — Non-Acute Institutional Stay (SKILLED_NURSING_FACILITY): Payer: Medicare HMO | Admitting: Gerontology

## 2017-04-15 DIAGNOSIS — C3492 Malignant neoplasm of unspecified part of left bronchus or lung: Secondary | ICD-10-CM

## 2017-04-15 DIAGNOSIS — Z515 Encounter for palliative care: Secondary | ICD-10-CM | POA: Diagnosis not present

## 2017-04-15 DIAGNOSIS — C7931 Secondary malignant neoplasm of brain: Secondary | ICD-10-CM

## 2017-04-16 ENCOUNTER — Encounter: Payer: Self-pay | Admitting: Gerontology

## 2017-04-16 NOTE — Progress Notes (Signed)
Location:   [facility] Room Number: [phone] Place of Service:  SNF [phone])  Provider: Toni Arthurs, NP-C  PCP: Rusty Aus, MD Patient Care Team: Rusty Aus, MD as PCP - General (Internal Medicine)  Extended Emergency Contact Information Primary Emergency Contact: Truitt,Dolores          elon, [place] Phone: [phone] Mobile Phone: [phone] Relation: Other Secondary Emergency Contact: Caro Hight, [place] Phone: [phone] Work Phone: [phone] Relation: Niece  Code Status: DNR Goals of care:  Advanced Directive information Advanced Directives 04/16/2017  Does Patient Have a Medical Advance Directive? Yes  Type of Advance Directive Out of facility DNR (pink MOST or yellow form)  Does patient want to make changes to medical advance directive? No - Patient declined  Copy of [place] in Chart? -     No Known Allergies  Chief Complaint  Patient presents with  . Discharge Note    Discharged from SNF    HPI:  83 y.o. male seen today for discharge evaluation. Pt was admitted to the facility initially for rehab. Once therapy was complete, he was transitioned to Hospice services. Initially, his pain was severe. Medication adjustments were made. Pt now more comfortable. Staff reports pt has had increased restlessness and agitation over the weekend. He was trying to get out of bed. Yelling out. Increased confusion. Family requested medications to help calm the agitation. I suspect the sudden increase in restlessness and agitation is r/t progressive brain mets as well as terminal restlessness. This is a significant change from last visit. On exam, pt is sedate with deep, snoring like respirations. Diminished lungs in the bases. Tachy, irregular heartbeat. Feet cool. Skin pale, but warm. Minimal responsiveness to stimuli. Foley in place. Long discussion with  niece about about the decision to transfer to the Hospice Home, signs of EOL, etc. Niece appreciative of new medications for comfort. Will continue to assist when possible with comfort measures and transferring to the Hospice Home.      Past Medical History:  Diagnosis Date  . 2-vessel coronary artery disease 08/19/2016   Diffuse disease 50 - 60% medical management, cath 12/17  . Anemia   . BPH (benign prostatic hyperplasia)   . Chronic idiopathic urticaria   . Chronic urticaria 05/30/2016  . Cyst of pancreas 05/30/2016   Seen by MRI, EUS 6/17 benign, recheck CT one year  . Diverticulosis    colonoscopy 2003  . Dupuytren's contracture of right hand   . History of colon polyps   . History of hiatal hernia   . Hyperlipemia   . Lung cancer ([place])   . Shingles     Past Surgical History:  Procedure Laterality Date  . CARDIAC CATHETERIZATION Left 08/12/2016   Procedure: Left Heart Cath and Coronary Angiography;  Surgeon: Teodoro Spray, MD;  Location: [facility];  Service: Cardiovascular;  Laterality: Left;  . COLONOSCOPY  12/14/2001   [facility] ( Father )  . COLONOSCOPY  03/26/2010   01/29/2007 ; adenomatous polyp, [facility] (Father)   . COLONOSCOPY  05/25/2013   PH Adenomatous polyps, [facility] (Father) ; [facility] 9/20  . EUS N/A 02/22/2016   Procedure: UPPER ENDOSCOPIC ULTRASOUND (EUS) LINEAR;  Surgeon: Cora Daniels, MD;  Location: [facility] ENDOSCOPY;  Service: Endoscopy;  Laterality: N/A;  . TONSILLECTOMY        reports that  he quit smoking about 35 years ago. His smoking use included Cigarettes. He quit after 30.00 years of use. He has never used smokeless tobacco. He reports that he does not drink alcohol or use drugs. Social History   Social History  . Marital status: Widowed    Spouse name: N/A  . Number of children: 0  . Years of education: college degree   Occupational History  . retired     former Recruitment consultant for [employer] History Main Topics  . Smoking  status: Former Smoker    Years: 30.00    Types: Cigarettes    Quit date: 05/29/1981  . Smokeless tobacco: Never Used  . Alcohol use No  . Drug use: No  . Sexual activity: Not on file   Other Topics Concern  . Not on file   Social History Narrative  . No narrative on file   Functional Status Survey:    No Known Allergies  Pertinent  Health Maintenance Due  Topic Date Due  . PNA vac Low Risk Adult (1 of 2 - PCV13) 10/04/1996  . INFLUENZA VACCINE  07/16/2017 (Originally 04/16/2017)    Medications: Allergies as of 04/15/2017   No Known Allergies     Medication List       Accurate as of 04/15/17 11:59 PM. Always use your most recent med list.          acetaminophen 325 MG tablet Commonly known as:  TYLENOL Take 650 mg by mouth 4 (four) times daily.   acidophilus Caps capsule Take 1 capsule by mouth daily.   aspirin EC 81 MG tablet Take 1 tablet by mouth daily.   CENTRUM tablet Take 1 tablet by mouth daily.   cyanocobalamin 1000 MCG tablet Take 1,000 mcg by mouth daily.   dexamethasone 4 MG tablet Commonly known as:  DECADRON Take 4 mg by mouth 3 (three) times daily.   diphenhydrAMINE 25 mg capsule Commonly known as:  BENADRYL Take 25 mg by mouth every 6 (six) hours as needed for itching.   docusate sodium 100 MG capsule Commonly known as:  COLACE Take 1 capsule (100 mg total) by mouth 2 (two) times daily.   doxepin 10 MG capsule Commonly known as:  SINEQUAN Take 10 mg by mouth at bedtime.   ENSURE ENLIVE PO Take 237 mLs by mouth 3 (three) times daily with meals.   finasteride 5 MG tablet Commonly known as:  PROSCAR Take 1 tablet (5 mg total) by mouth daily.   Fluticasone-Salmeterol 250-50 MCG/DOSE Aepb Commonly known as:  ADVAIR Inhale 1 puff into the lungs 2 (two) times daily.   hydrOXYzine 25 MG tablet Commonly known as:  ATARAX/VISTARIL Take 12.5 mg by mouth 2 (two) times daily as needed for itching. 1/2 tab   linaclotide 145 MCG Caps  capsule Commonly known as:  LINZESS Take 145 mcg by mouth daily with breakfast.   loratadine 10 MG tablet Commonly known as:  CLARITIN Take 10 mg by mouth daily.   megestrol 40 MG tablet Commonly known as:  MEGACE Take 40 mg by mouth daily.   midodrine 10 MG tablet Commonly known as:  PROAMATINE Take 1 tablet (10 mg total) by mouth 3 (three) times daily with meals.   Oxycodone HCl 10 MG Tabs Take 10 mg by mouth 3 (three) times daily. Scheduled for pain, ok to hold for sedation, ok to give prn doses in addition to scheduled for severe pain.   Oxycodone HCl 10 MG  Tabs Take 1-2 tablets (10-20 mg total) by mouth every 6 (six) hours as needed.   prochlorperazine 10 MG tablet Commonly known as:  COMPAZINE TAKE ONE TABLET BY MOUTH EVERY 6 HOURS AS NEEDED FOR NAUSEA / VOMITING   sucralfate 1 g tablet Commonly known as:  CARAFATE Take 1 tablet (1 g total) by mouth 2 (two) times daily. Dissolve in 2-3 tbsp warm water, swish and swallow   triamcinolone cream 0.1 % Commonly known as:  KENALOG Apply 1 application topically 2 (two) times daily. to affected area       Review of Systems  Unable to perform ROS: Patient unresponsive  Constitutional: Positive for activity change, appetite change and fatigue.  HENT: Negative.   Respiratory: Negative.   Cardiovascular: Negative.   Genitourinary: Negative.   Neurological: Negative.   Psychiatric/Behavioral: Negative.     Vitals:   04/15/17 1146  BP: (!) 97/59  Pulse: 86  Resp: 20  Temp: (!) 97.3 F (36.3 C)  SpO2: 98%  Weight: 144 lb (65.3 kg)  Height: 6' (1.829 m)   Body mass index is 19.53 kg/m. Physical Exam  Constitutional: He is oriented to person, place, and time. He appears well-developed and well-nourished. He is sleeping and active. No distress.  HENT:  Head: Normocephalic and atraumatic.  Mouth/Throat: Uvula is midline, oropharynx is clear and moist and mucous membranes are normal. Mucous membranes are not pale,  not dry and not cyanotic.  Eyes: Pupils are equal, round, and reactive to light. Conjunctivae, EOM and lids are normal.  Neck: Trachea normal, normal range of motion and full passive range of motion without pain. Neck supple. No JVD present. No tracheal deviation, no edema and no erythema present. No thyromegaly present.  Cardiovascular: Intact distal pulses.  An irregular rhythm present. Tachycardia present.  Exam reveals no gallop and no distant heart sounds.   Murmur heard. Pulses:      Dorsalis pedis pulses are 1+ on the right side, and 1+ on the left side.  Pulmonary/Chest: Accessory muscle usage present. No respiratory distress. He has decreased breath sounds in the right middle field, the right lower field, the left middle field and the left lower field. He has no wheezes. He has no rhonchi. He has no rales. He exhibits no tenderness.  Abdominal: Soft. Normal appearance and bowel sounds are normal. He exhibits no distension and no ascites. There is no tenderness.  Musculoskeletal: Normal range of motion. He exhibits no edema or tenderness.  Expected osteoarthritis, stiffness  Neurological: He is oriented to person, place, and time. He has normal strength. He is unresponsive.  Skin: Skin is warm, dry and intact. He is not diaphoretic. No cyanosis. There is pallor. Nails show no clubbing.  Nursing note and vitals reviewed.   Labs reviewed: Basic Metabolic Panel:  Recent Labs  03/28/17 0846 03/28/17 1421 03/29/17 0512 03/30/17 0434 03/31/17 0430  NA 137  --  140 140 140  K 4.4  --  3.7 3.2* 4.4  CL 110  --  116* 118* 116*  CO2 18*  --  19* 17* 16*  GLUCOSE 122*  --  82 87 133*  BUN 26*  --  21* 20 20  CREATININE 1.19  --  0.91 0.80 0.61  CALCIUM 8.4*  --  7.2* 7.1* 7.9*  MG 2.4 2.5*  --   --   --   PHOS 3.0  --   --   --   --    Liver Function Tests:  Recent Labs  02/20/17 0946 03/20/17 1020 03/28/17 0846  AST 23 24 37  ALT 15* 18 22  ALKPHOS 31* 44 37*  BILITOT 0.5  0.5 0.8  PROT 7.0 7.4 6.5  ALBUMIN 4.1 3.8 3.3*    Recent Labs  10/14/16 1012  LIPASE 17   No results for input(s): AMMONIA in the last 8760 hours. CBC:  Recent Labs  02/20/17 0946 03/20/17 1020 03/28/17 0846 03/30/17 0434 03/31/17 0430  WBC 6.3 5.8 7.3 3.9 4.5  NEUTROABS 4.8 4.2 6.2  --   --   HGB 9.7* 9.4* 9.6* 5.8* 8.1*  HCT 27.1* 26.8* 27.9* 16.6* 23.3*  MCV 112.5* 110.3* 109.8* 108.5* 105.8*  PLT 157 136* 165 108* 133*   Cardiac Enzymes:  Recent Labs  03/28/17 0846  TROPONINI <0.03   BNP: Invalid input(s): POCBNP CBG:  Recent Labs  09/11/16 1238 01/16/17 0924 03/28/17 0908  GLUCAP 78 95 95    Procedures and Imaging Studies During Stay: Ct Head Wo Contrast  Result Date: 03/28/2017 CLINICAL DATA:  Fall in shower this morning. EXAM: CT HEAD WITHOUT CONTRAST CT CERVICAL SPINE WITHOUT CONTRAST TECHNIQUE: Multidetector CT imaging of the head and cervical spine was performed following the standard protocol without intravenous contrast. Multiplanar CT image reconstructions of the cervical spine were also generated. COMPARISON:  Brain MRI 10/07/2016. PET CT 01/16/2017. CTA neck 08/16/2016. FINDINGS: CT HEAD FINDINGS Brain: No acute intracranial abnormality. Specifically, no hemorrhage, hydrocephalus, mass lesion, acute infarction, or significant intracranial injury. Mild cerebral atrophy. Vascular: No hyperdense vessel or unexpected calcification. Skull: No acute calvarial abnormality. Sinuses/Orbits: Visualized paranasal sinuses and mastoids clear. Orbital soft tissues unremarkable. Other: None CT CERVICAL SPINE FINDINGS Alignment: Normal Skull base and vertebrae: No fracture Soft tissues and spinal canal: Prevertebral soft tissues are normal. No epidural or paraspinal hematoma. Disc levels: Mild disc space narrowing and spurring C5-6 and C6-7. Mild bilateral degenerative facet disease. Upper chest: Rounded soft tissue area noted in the region of the left paraspinal soft  tissues that CT to on image 76. This is stable since prior CTA performed 08/16/2016 and did not demonstrate increased uptake on prior PET CT from 01/16/2017. Therefore this most likely represents a benign process. There is underlying sclerosis and remodeling of the T2 posterior elements on the left. Other: None IMPRESSION: No acute intracranial abnormality. No acute bony abnormality in the cervical spine. Electronically Signed   By: Rolm Baptise M.D.   On: 03/28/2017 10:01   Ct Cervical Spine Wo Contrast  Result Date: 03/28/2017 CLINICAL DATA:  Fall in shower this morning. EXAM: CT HEAD WITHOUT CONTRAST CT CERVICAL SPINE WITHOUT CONTRAST TECHNIQUE: Multidetector CT imaging of the head and cervical spine was performed following the standard protocol without intravenous contrast. Multiplanar CT image reconstructions of the cervical spine were also generated. COMPARISON:  Brain MRI 10/07/2016. PET CT 01/16/2017. CTA neck 08/16/2016. FINDINGS: CT HEAD FINDINGS Brain: No acute intracranial abnormality. Specifically, no hemorrhage, hydrocephalus, mass lesion, acute infarction, or significant intracranial injury. Mild cerebral atrophy. Vascular: No hyperdense vessel or unexpected calcification. Skull: No acute calvarial abnormality. Sinuses/Orbits: Visualized paranasal sinuses and mastoids clear. Orbital soft tissues unremarkable. Other: None CT CERVICAL SPINE FINDINGS Alignment: Normal Skull base and vertebrae: No fracture Soft tissues and spinal canal: Prevertebral soft tissues are normal. No epidural or paraspinal hematoma. Disc levels: Mild disc space narrowing and spurring C5-6 and C6-7. Mild bilateral degenerative facet disease. Upper chest: Rounded soft tissue area noted in the region of the left paraspinal soft tissues that  CT to on image 76. This is stable since prior CTA performed 08/16/2016 and did not demonstrate increased uptake on prior PET CT from 01/16/2017. Therefore this most likely represents a  benign process. There is underlying sclerosis and remodeling of the T2 posterior elements on the left. Other: None IMPRESSION: No acute intracranial abnormality. No acute bony abnormality in the cervical spine. Electronically Signed   By: Rolm Baptise M.D.   On: 03/28/2017 10:01   Mr Jeri Cos TJ Contrast  Result Date: 03/28/2017 CLINICAL DATA:  83 y/o M; lung cancer and headache with syncopal episode. EXAM: MRI HEAD WITHOUT AND WITH CONTRAST TECHNIQUE: Multiplanar, multiecho pulse sequences of the brain and surrounding structures were obtained without and with intravenous contrast. CONTRAST:  76mL MULTIHANCE GADOBENATE DIMEGLUMINE 529 MG/ML IV SOLN COMPARISON:  10/07/2016 MRI of the brain. 03/28/2017 CT of the head. FINDINGS: Brain: Increased T2 signal within the upper cervical spinal cord and lower medulla new from the prior study. Previous foci of enhancement in left frontal lobe and right cerebellum are no longer appreciated. New focus of irregular enhancement spanning approximately 15 mm in the left anterior inferior frontal lobe along the falx with T2 FLAIR signal abnormality that is new from the prior study (Series 12, image 61 and series 14, image 17) probably representing metastasis. No evidence of acute infarction, hemorrhage, significant mass effect, or hydrocephalus. Stable background of mild chronic microvascular ischemic changes and parenchymal volume loss of the brain. Vascular: Normal flow voids. Skull: Normal marrow signal. Sinuses/Orbits: Negative. Other: None. IMPRESSION: 1. New focus of irregular intra-axial enhancement spanning 15 mm and left anterior inferior frontal lobe along falx with edema, probably new metastasis. 2. Interval resolution of left frontal and right cerebellar hemisphere metastasis. 3. Abnormal T2 signal within the lower medulla and upper cervical spinal cord. MRI of the cervical spine with and without contrast is recommended to evaluate for spinal cord metastasis. These  results will be called to the ordering clinician or representative by the Radiologist Assistant, and communication documented in the PACS or zVision Dashboard. Electronically Signed   By: Kristine Garbe M.D.   On: 03/28/2017 18:27   Mr Cervical Spine W Wo Contrast  Result Date: 03/30/2017 CLINICAL DATA:  Patient with a history of stage IV lung carcinoma who became unresponsive 03/28/2017. Abnormal appearance of the upper cervical cord brain MRI 03/28/2017. EXAM: MRI CERVICAL SPINE WITHOUT AND WITH CONTRAST TECHNIQUE: Multiplanar and multiecho pulse sequences of the cervical spine, to include the craniocervical junction and cervicothoracic junction, were obtained without and with intravenous contrast. CONTRAST:  15 ml MULTIHANCE GADOBENATE DIMEGLUMINE 529 MG/ML IV SOLN COMPARISON:  Brain MRI 03/28/2017. FINDINGS: Alignment: Maintained. Vertebrae: Vertebral body height is maintained. Metastatic deposits are seen in the T1 and T2 vertebral bodies. Lesion in the T1 vertebral body measures 1 cm AP on image 11 of series 4. Cord: Innumerable enhancing lesions are seen throughout the imaged cord consistent with metastatic disease. Index lesion posterior to C5-6 measures 0.7 cm AP on image 9 of series 15. Posterior Fossa, vertebral arteries, paraspinal tissues: Negative. Disc levels: Overall mild cervical spondylosis is most notable at C4-5 where a central disc protrusion contacts the ventral cord. IMPRESSION: Innumerable enhancing lesions throughout the visualized cervical and thoracic cord consistent with metastatic disease. Osseous metastases are also seen and best visualized in the T1 and T2 vertebral bodies. Electronically Signed   By: Inge Rise M.D.   On: 03/30/2017 12:48   Dg Chest Portable 1 View  Result Date:  03/28/2017 CLINICAL DATA:  Weakness.  Syncope in the shower EXAM: PORTABLE CHEST 1 VIEW COMPARISON:  PET CT 01/16/2017 FINDINGS: Low volumes with coarse interstitial opacities from  chronic lung disease/ fibrosis. Known subpleural opacity in the left upper lobe. Patient has history of treated lung cancer. Normal heart size and mediastinal contours. There is no edema, consolidation, effusion, or pneumothorax. IMPRESSION: 1. No acute finding. 2. Pulmonary fibrosis and treated left lung cancer. Electronically Signed   By: Monte Fantasia M.D.   On: 03/28/2017 09:13    Assessment/Plan:   1.Small cell lung cancer, left ([place]) 2. 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 Encounter for dying care  DC all non-comfort meds  Morphine 20 mg/ mL- 0.25-0.5 mL po Q 1 hour prn- pain, dyspnea  Lorazepam 0.5 mg po TID scheduled for anxiety  Lorazepam 0.5 mg po Q 4 hours prn anxiety, agitation  Seroquel 25 mg po BID for terminal restlessness  Continue foley for comfort  Oxygen prn for comfort  Duonebs TID scheduled for dyspnea  Fentanyl 12 mcg patch- change Q 3 days  May transfer to the [facility] when bed is available  Patient is being discharged with the following home health services: transfer to the [facility] for EOL care  Patient is being discharged with the following durable medical equipment:    Patient has been advised to f/u with their PCP in 1-2 weeks to bring them up to date on their rehab stay.  Social services at facility was responsible for arranging this appointment.  Pt was provided with a 30 day supply of prescriptions for medications and refills must be obtained from their PCP.  For controlled substances, a more limited supply may be provided adequate until PCP appointment only.  Future labs/tests needed:   Family/ staff Communication:   Total Time: 40 minutes  Documentation:  Face to Face: 10 minutes  Family/Phone: spoke with niece for 41 minutes  Vikki Ports, NP-C [facility] [address], [place] [address] Cell Phone (Mon-Fri 8am-5pm):  [phone] On Call:  [phone]  & follow prompts after 5pm & weekends Office Phone:  [phone] Office Fax:  [phone]

## 2017-04-17 ENCOUNTER — Ambulatory Visit: Payer: Medicare HMO

## 2017-04-17 ENCOUNTER — Ambulatory Visit: Payer: Medicare HMO | Admitting: Oncology

## 2017-04-17 ENCOUNTER — Other Ambulatory Visit: Payer: Medicare HMO

## 2017-04-20 NOTE — Progress Notes (Signed)
Location:      Place of Service:  SNF (31) Provider:  Toni Arthurs, NP-C  Rusty Aus, MD  Patient Care Team: Rusty Aus, MD as PCP - General (Internal Medicine)  Extended Emergency Contact Information Primary Emergency Contact: Truitt,Dolores          elon, [place] Phone: [phone] Mobile Phone: [phone] Relation: Other Secondary Emergency Contact: Caro Hight, [place] Phone: [phone] Work Phone: [phone] Relation: Niece  Code Status:  DNR Goals of care: Advanced Directive information Advanced Directives 04/16/2017  Does Patient Have a Medical Advance Directive? Yes  Type of Advance Directive Out of facility DNR (pink MOST or yellow form)  Does patient want to make changes to medical advance directive? No - Patient declined  Copy of [place] in Chart? -     Chief Complaint  Patient presents with  . Follow-up    HPI:  Pt is a 83 y.o. male seen today for follow up. Pt was admitted to the facility initially for rehab. Once therapy was complete, he was transitioned to Hospice services. Initially, his pain was severe. Medication adjustments were made. Today, pt reports his pain is well controlled. He is sitting up in bed, pleasant and smiling. Answers questions appropriately. Denies n/v/d/f/c/cp/sob/ha/abd pain/dizziness/cough. Pt reports his appetite is fair. VSS. No other complaints.      Past Medical History:  Diagnosis Date  . 2-vessel coronary artery disease 08/19/2016   Diffuse disease 50 - 60% medical management, cath 12/17  . Anemia   . BPH (benign prostatic hyperplasia)   . Chronic idiopathic urticaria   . Chronic urticaria 05/30/2016  . Cyst of pancreas 05/30/2016   Seen by MRI, EUS 6/17 benign, recheck CT one year  . Diverticulosis    colonoscopy 2003  . Dupuytren's contracture of right hand   . History of colon polyps   . History of hiatal  hernia   . Hyperlipemia   . Lung cancer ([place])   . Shingles    Past Surgical History:  Procedure Laterality Date  . CARDIAC CATHETERIZATION Left 08/12/2016   Procedure: Left Heart Cath and Coronary Angiography;  Surgeon: Teodoro Spray, MD;  Location: [facility];  Service: Cardiovascular;  Laterality: Left;  . COLONOSCOPY  12/14/2001   [facility] ( Father )  . COLONOSCOPY  03/26/2010   01/29/2007 ; adenomatous polyp, [facility] (Father)   . COLONOSCOPY  05/25/2013   PH Adenomatous polyps, [facility] (Father) ; [facility] 9/20  . EUS N/A 02/22/2016   Procedure: UPPER ENDOSCOPIC ULTRASOUND (EUS) LINEAR;  Surgeon: Cora Daniels, MD;  Location: [facility];  Service: Endoscopy;  Laterality: N/A;  . TONSILLECTOMY      No Known Allergies  Allergies as of 04/11/2017   No Known Allergies     Medication List       Accurate as of 04/11/17 11:59 PM. Always use your most recent med list.          aspirin EC 81 MG tablet Take 1 tablet by mouth daily.   docusate sodium 100 MG capsule Commonly known as:  COLACE Take 1 capsule (100 mg total) by mouth 2 (two) times daily.   doxepin 10 MG capsule Commonly known as:  SINEQUAN Take 10 mg by mouth at bedtime.   finasteride 5 MG tablet Commonly known as:  PROSCAR Take 1 tablet (5 mg total) by mouth daily.  linaclotide 145 MCG Caps capsule Commonly known as:  LINZESS Take 145 mcg by mouth daily with breakfast.   midodrine 10 MG tablet Commonly known as:  PROAMATINE Take 1 tablet (10 mg total) by mouth 3 (three) times daily with meals.   Oxycodone HCl 10 MG Tabs Take 1-2 tablets (10-20 mg total) by mouth every 6 (six) hours as needed.   prochlorperazine 10 MG tablet Commonly known as:  COMPAZINE TAKE ONE TABLET BY MOUTH EVERY 6 HOURS AS NEEDED FOR NAUSEA / VOMITING   sucralfate 1 g tablet Commonly known as:  CARAFATE Take 1 tablet (1 g total) by mouth 2 (two) times daily. Dissolve in 2-3 tbsp warm water, swish and swallow     triamcinolone cream 0.1 % Commonly known as:  KENALOG Apply 1 application topically 2 (two) times daily. to affected area       Review of Systems  Constitutional: Positive for activity change and appetite change. Negative for chills, diaphoresis and fever.  HENT: Negative for congestion, sneezing, sore throat, trouble swallowing and voice change.   Respiratory: Negative.  Negative for apnea, cough, choking, chest tightness, shortness of breath and wheezing.   Cardiovascular: Negative for chest pain, palpitations and leg swelling.  Gastrointestinal: Negative.   Genitourinary: Negative.   Musculoskeletal: Positive for back pain and gait problem. Negative for myalgias. Arthralgias: typical arthritis.  Skin: Negative for color change, pallor, rash and wound.  Neurological: Negative for dizziness, tremors, syncope, speech difficulty, weakness, numbness and headaches.  Psychiatric/Behavioral: Negative.  Negative for agitation and behavioral problems.  All other systems reviewed and are negative.   Immunization History  Administered Date(s) Administered  . Influenza-Unspecified 06/16/2014, 06/29/2015, 05/30/2016  . PPD Test 04/01/2017  . Pneumococcal Conjugate-13 05/30/2015  . Pneumococcal Polysaccharide-23 05/30/2016   Pertinent  Health Maintenance Due  Topic Date Due  . INFLUENZA VACCINE  07/16/2017 (Originally 04/16/2017)  . PNA vac Low Risk Adult  Completed   Fall Risk  03/20/2017 12/05/2016 10/10/2016 10/09/2016  Falls in the past year? No No No No   Functional Status Survey:    Vitals:   04/10/17 1000  BP: (!) 97/59  Pulse: 86  Resp: 20  Temp: (!) 97.3 F (36.3 C)  SpO2: 98%  Weight: 144 lb (65.3 kg)   Body mass index is 19.53 kg/m. Physical Exam  Constitutional: He is oriented to person, place, and time. Vital signs are normal. He appears well-developed and well-nourished. He is active and cooperative. He does not appear ill. No distress.  HENT:  Head: Normocephalic  and atraumatic.  Mouth/Throat: Uvula is midline, oropharynx is clear and moist and mucous membranes are normal. Mucous membranes are not pale, not dry and not cyanotic.  Eyes: Pupils are equal, round, and reactive to light. Conjunctivae, EOM and lids are normal.  Neck: Trachea normal, normal range of motion and full passive range of motion without pain. Neck supple. No JVD present. No tracheal deviation, no edema and no erythema present. No thyromegaly present.  Cardiovascular: Normal rate, regular rhythm and intact distal pulses.  Exam reveals no gallop, no distant heart sounds and no friction rub.   Murmur heard. Pulses:      Dorsalis pedis pulses are 1+ on the right side, and 1+ on the left side.  Pulmonary/Chest: Effort normal. No accessory muscle usage. No respiratory distress. He has decreased breath sounds in the right lower field and the left lower field. He has no wheezes. He has no rhonchi. He has no rales. He exhibits  no tenderness.  Abdominal: Normal appearance and bowel sounds are normal. He exhibits no distension and no ascites. There is no tenderness.  Musculoskeletal: Normal range of motion. He exhibits no edema or tenderness.  Expected osteoarthritis, stiffness  Neurological: He is alert and oriented to person, place, and time. He has normal strength.  Skin: Skin is warm, dry and intact. No rash noted. He is not diaphoretic. No cyanosis or erythema. No pallor. Nails show no clubbing.  Psychiatric: He has a normal mood and affect. His speech is normal and behavior is normal. Judgment and thought content normal. Cognition and memory are normal.  Nursing note and vitals reviewed.   Labs reviewed:  Recent Labs  03/28/17 0846 03/28/17 1421 03/29/17 0512 03/30/17 0434 03/31/17 0430  NA 137  --  140 140 140  K 4.4  --  3.7 3.2* 4.4  CL 110  --  116* 118* 116*  CO2 18*  --  19* 17* 16*  GLUCOSE 122*  --  82 87 133*  BUN 26*  --  21* 20 20  CREATININE 1.19  --  0.91 0.80 0.61   CALCIUM 8.4*  --  7.2* 7.1* 7.9*  MG 2.4 2.5*  --   --   --   PHOS 3.0  --   --   --   --     Recent Labs  02/20/17 0946 03/20/17 1020 03/28/17 0846  AST 23 24 37  ALT 15* 18 22  ALKPHOS 31* 44 37*  BILITOT 0.5 0.5 0.8  PROT 7.0 7.4 6.5  ALBUMIN 4.1 3.8 3.3*    Recent Labs  02/20/17 0946 03/20/17 1020 03/28/17 0846 03/30/17 0434 03/31/17 0430  WBC 6.3 5.8 7.3 3.9 4.5  NEUTROABS 4.8 4.2 6.2  --   --   HGB 9.7* 9.4* 9.6* 5.8* 8.1*  HCT 27.1* 26.8* 27.9* 16.6* 23.3*  MCV 112.5* 110.3* 109.8* 108.5* 105.8*  PLT 157 136* 165 108* 133*   No results found for: TSH No results found for: HGBA1C No results found for: CHOL, HDL, LDLCALC, LDLDIRECT, TRIG, CHOLHDL  Significant Diagnostic Results in last 30 days:  Ct Head Wo Contrast  Result Date: 03/28/2017 CLINICAL DATA:  Fall in shower this morning. EXAM: CT HEAD WITHOUT CONTRAST CT CERVICAL SPINE WITHOUT CONTRAST TECHNIQUE: Multidetector CT imaging of the head and cervical spine was performed following the standard protocol without intravenous contrast. Multiplanar CT image reconstructions of the cervical spine were also generated. COMPARISON:  Brain MRI 10/07/2016. PET CT 01/16/2017. CTA neck 08/16/2016. FINDINGS: CT HEAD FINDINGS Brain: No acute intracranial abnormality. Specifically, no hemorrhage, hydrocephalus, mass lesion, acute infarction, or significant intracranial injury. Mild cerebral atrophy. Vascular: No hyperdense vessel or unexpected calcification. Skull: No acute calvarial abnormality. Sinuses/Orbits: Visualized paranasal sinuses and mastoids clear. Orbital soft tissues unremarkable. Other: None CT CERVICAL SPINE FINDINGS Alignment: Normal Skull base and vertebrae: No fracture Soft tissues and spinal canal: Prevertebral soft tissues are normal. No epidural or paraspinal hematoma. Disc levels: Mild disc space narrowing and spurring C5-6 and C6-7. Mild bilateral degenerative facet disease. Upper chest: Rounded soft tissue  area noted in the region of the left paraspinal soft tissues that CT to on image 76. This is stable since prior CTA performed 08/16/2016 and did not demonstrate increased uptake on prior PET CT from 01/16/2017. Therefore this most likely represents a benign process. There is underlying sclerosis and remodeling of the T2 posterior elements on the left. Other: None IMPRESSION: No acute intracranial abnormality. No acute bony  abnormality in the cervical spine. Electronically Signed   By: Rolm Baptise M.D.   On: 03/28/2017 10:01   Ct Cervical Spine Wo Contrast  Result Date: 03/28/2017 CLINICAL DATA:  Fall in shower this morning. EXAM: CT HEAD WITHOUT CONTRAST CT CERVICAL SPINE WITHOUT CONTRAST TECHNIQUE: Multidetector CT imaging of the head and cervical spine was performed following the standard protocol without intravenous contrast. Multiplanar CT image reconstructions of the cervical spine were also generated. COMPARISON:  Brain MRI 10/07/2016. PET CT 01/16/2017. CTA neck 08/16/2016. FINDINGS: CT HEAD FINDINGS Brain: No acute intracranial abnormality. Specifically, no hemorrhage, hydrocephalus, mass lesion, acute infarction, or significant intracranial injury. Mild cerebral atrophy. Vascular: No hyperdense vessel or unexpected calcification. Skull: No acute calvarial abnormality. Sinuses/Orbits: Visualized paranasal sinuses and mastoids clear. Orbital soft tissues unremarkable. Other: None CT CERVICAL SPINE FINDINGS Alignment: Normal Skull base and vertebrae: No fracture Soft tissues and spinal canal: Prevertebral soft tissues are normal. No epidural or paraspinal hematoma. Disc levels: Mild disc space narrowing and spurring C5-6 and C6-7. Mild bilateral degenerative facet disease. Upper chest: Rounded soft tissue area noted in the region of the left paraspinal soft tissues that CT to on image 76. This is stable since prior CTA performed 08/16/2016 and did not demonstrate increased uptake on prior PET CT from  01/16/2017. Therefore this most likely represents a benign process. There is underlying sclerosis and remodeling of the T2 posterior elements on the left. Other: None IMPRESSION: No acute intracranial abnormality. No acute bony abnormality in the cervical spine. Electronically Signed   By: Rolm Baptise M.D.   On: 03/28/2017 10:01   Mr Jeri Cos AS Contrast  Result Date: 03/28/2017 CLINICAL DATA:  83 y/o M; lung cancer and headache with syncopal episode. EXAM: MRI HEAD WITHOUT AND WITH CONTRAST TECHNIQUE: Multiplanar, multiecho pulse sequences of the brain and surrounding structures were obtained without and with intravenous contrast. CONTRAST:  68mL MULTIHANCE GADOBENATE DIMEGLUMINE 529 MG/ML IV SOLN COMPARISON:  10/07/2016 MRI of the brain. 03/28/2017 CT of the head. FINDINGS: Brain: Increased T2 signal within the upper cervical spinal cord and lower medulla new from the prior study. Previous foci of enhancement in left frontal lobe and right cerebellum are no longer appreciated. New focus of irregular enhancement spanning approximately 15 mm in the left anterior inferior frontal lobe along the falx with T2 FLAIR signal abnormality that is new from the prior study (Series 12, image 61 and series 14, image 17) probably representing metastasis. No evidence of acute infarction, hemorrhage, significant mass effect, or hydrocephalus. Stable background of mild chronic microvascular ischemic changes and parenchymal volume loss of the brain. Vascular: Normal flow voids. Skull: Normal marrow signal. Sinuses/Orbits: Negative. Other: None. IMPRESSION: 1. New focus of irregular intra-axial enhancement spanning 15 mm and left anterior inferior frontal lobe along falx with edema, probably new metastasis. 2. Interval resolution of left frontal and right cerebellar hemisphere metastasis. 3. Abnormal T2 signal within the lower medulla and upper cervical spinal cord. MRI of the cervical spine with and without contrast is  recommended to evaluate for spinal cord metastasis. These results will be called to the ordering clinician or representative by the Radiologist Assistant, and communication documented in the PACS or zVision Dashboard. Electronically Signed   By: Kristine Garbe M.D.   On: 03/28/2017 18:27   Mr Cervical Spine W Wo Contrast  Result Date: 03/30/2017 CLINICAL DATA:  Patient with a history of stage IV lung carcinoma who became unresponsive 03/28/2017. Abnormal appearance of the upper cervical cord  brain MRI 03/28/2017. EXAM: MRI CERVICAL SPINE WITHOUT AND WITH CONTRAST TECHNIQUE: Multiplanar and multiecho pulse sequences of the cervical spine, to include the craniocervical junction and cervicothoracic junction, were obtained without and with intravenous contrast. CONTRAST:  15 ml MULTIHANCE GADOBENATE DIMEGLUMINE 529 MG/ML IV SOLN COMPARISON:  Brain MRI 03/28/2017. FINDINGS: Alignment: Maintained. Vertebrae: Vertebral body height is maintained. Metastatic deposits are seen in the T1 and T2 vertebral bodies. Lesion in the T1 vertebral body measures 1 cm AP on image 11 of series 4. Cord: Innumerable enhancing lesions are seen throughout the imaged cord consistent with metastatic disease. Index lesion posterior to C5-6 measures 0.7 cm AP on image 9 of series 15. Posterior Fossa, vertebral arteries, paraspinal tissues: Negative. Disc levels: Overall mild cervical spondylosis is most notable at C4-5 where a central disc protrusion contacts the ventral cord. IMPRESSION: Innumerable enhancing lesions throughout the visualized cervical and thoracic cord consistent with metastatic disease. Osseous metastases are also seen and best visualized in the T1 and T2 vertebral bodies. Electronically Signed   By: Inge Rise M.D.   On: 03/30/2017 12:48   Dg Chest Portable 1 View  Result Date: 03/28/2017 CLINICAL DATA:  Weakness.  Syncope in the shower EXAM: PORTABLE CHEST 1 VIEW COMPARISON:  PET CT 01/16/2017  FINDINGS: Low volumes with coarse interstitial opacities from chronic lung disease/ fibrosis. Known subpleural opacity in the left upper lobe. Patient has history of treated lung cancer. Normal heart size and mediastinal contours. There is no edema, consolidation, effusion, or pneumothorax. IMPRESSION: 1. No acute finding. 2. Pulmonary fibrosis and treated left lung cancer. Electronically Signed   By: Monte Fantasia M.D.   On: 03/28/2017 09:13    Assessment/Plan 1. Small cell lung cancer, left ([facility]) 2. 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 Monthly medication orders reviewed and signed.  Vikki Ports, NP-C [facility] [phone], [place] [address] Cell Phone (Mon-Fri 8am-5pm):  [phone] On Call:  [phone] & follow prompts after 5pm & weekends Office Phone:  [phone] Office Fax:  [phone]

## 2017-05-17 DEATH — deceased

## 2017-06-19 ENCOUNTER — Ambulatory Visit: Payer: Self-pay | Admitting: Radiation Oncology

## 2017-06-23 ENCOUNTER — Other Ambulatory Visit: Payer: Self-pay | Admitting: Nurse Practitioner

## 2019-02-06 IMAGING — MR MR ABDOMEN WO/W CM MRCP
13 of 20 series · 28 of 48 positions shown · IV contrast (14mL Multihance)
Comparison: PET-CT 09/11/2016 and 01/16/2017. Abdominal CT
08/26/2016. Abdominal MRI 02/09/2016.

CLINICAL DATA: 87-year-old with low-density pancreatic head lesion
on CT. Lung cancer diagnosed in August 2016.

EXAM:
MRI ABDOMEN WITHOUT AND WITH CONTRAST (INCLUDING MRCP)
TECHNIQUE: Multiplanar multisequence MR imaging of the abdomen was performed
both before and after the administration of intravenous contrast.
Heavily T2-weighted images of the biliary and pancreatic ducts were
obtained, and three-dimensional MRCP images were rendered by post
processing.
CONTRAST:  14mL MULTIHANCE GADOBENATE DIMEGLUMINE 529 MG/ML IV SOLN

[Series 6: T2 · coronal · 8.0mm · 0.74mm/px · 2 of 20 slices shown (1 of 2)]
[im 1/20]
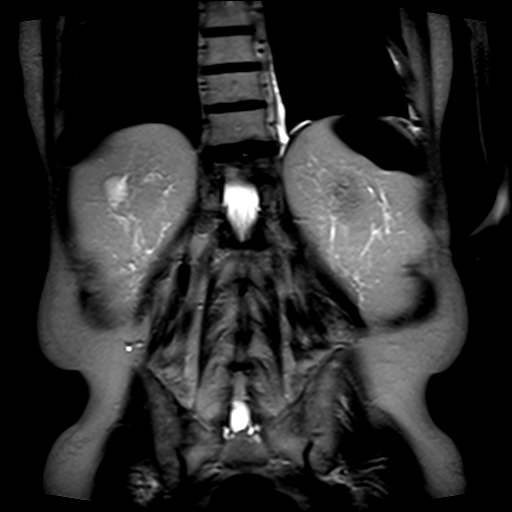
[im 20/20]
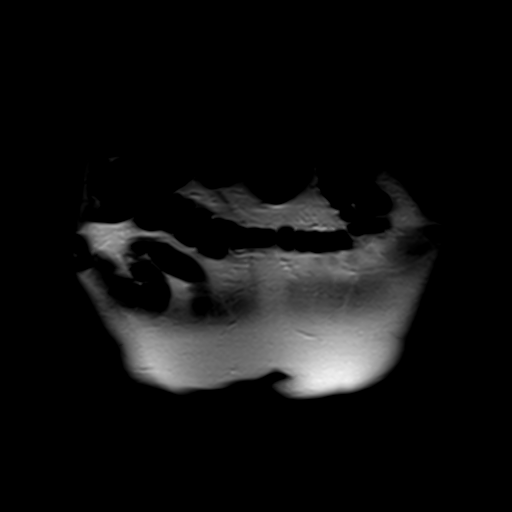

[Series 7: T2 fat-sat · axial · 7.0mm · 0.74mm/px · 1 of 19 slices shown]
[im 1/19]
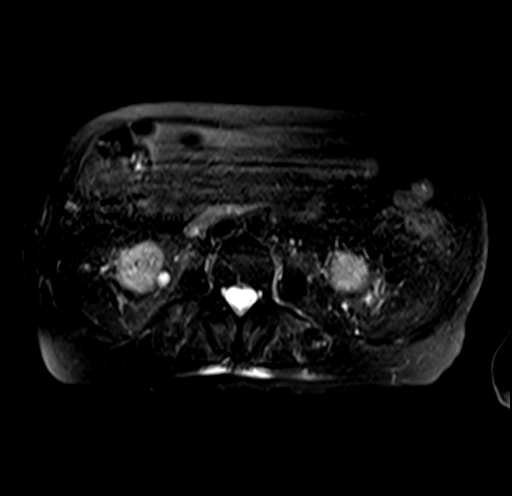

[Series 8: T1 · axial · 7.0mm · 0.74mm/px · z∈[+40,+191]mm · 2 of 38 slices shown]
[im 1/38]
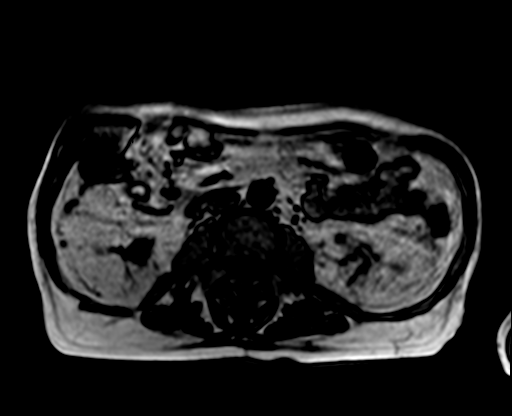
[im 38/38]
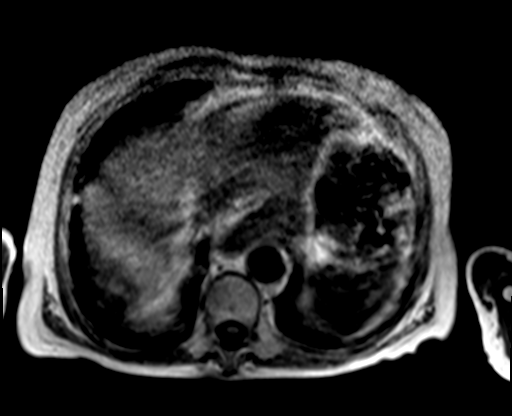

[Series 9: bSSFP · axial · 4.0mm · 0.74mm/px · z∈[+38,+194]mm · 2 of 40 slices shown]
[im 1/40]
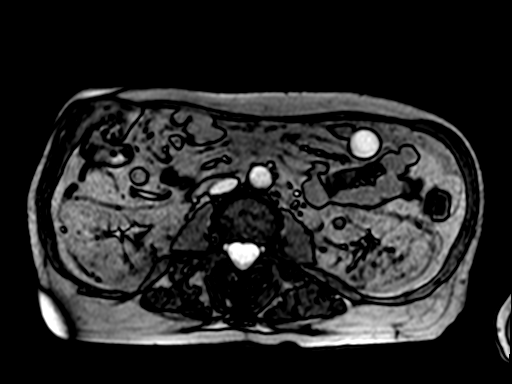
[im 40/40]
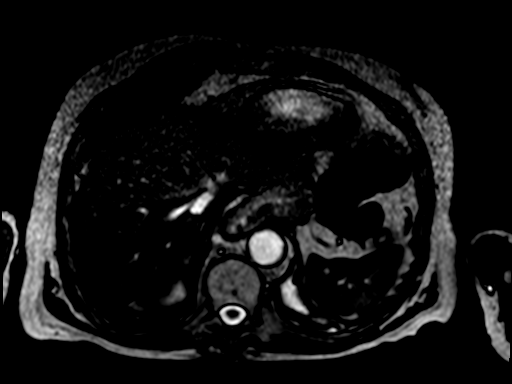

[Series 10: T1 fat-sat · axial · 6.0mm · 1.48mm/px · 1 of 23 slices shown]
[im 1/23]
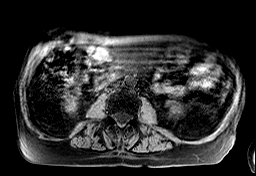

[Series 12: DWI · axial · 6.0mm · 1.98mm/px · z∈[+28,+237]mm · 4 of 90 slices shown]
[im 1/90]
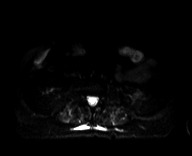
[im 30/90]
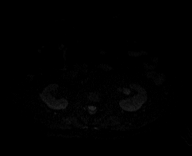
[im 60/90]
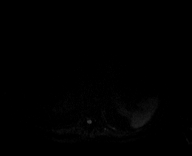
[im 90/90]
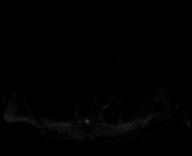

[Series 13: ax dwi_adc · axial · 6.0mm · 1.98mm/px · 1 of 30 slices shown]
[im 1/30]
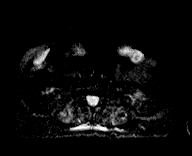

[Series 17: T2 · axial · 6.0mm · 0.74mm/px · 1 of 21 slices shown (2 of 2)]
[im 1/21]
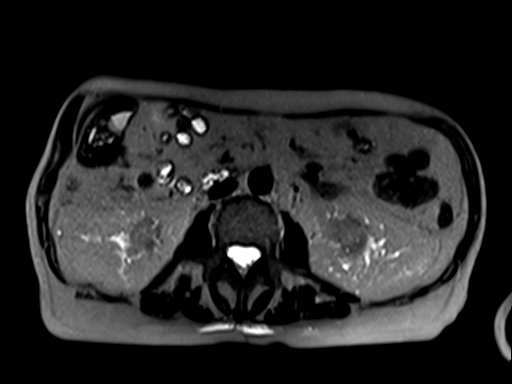

[Series 18: T1 dynamic fat-sat · axial · non-contrast · 2.5mm · 0.74mm/px · z∈[+37,+194]mm · 3 of 64 slices shown (1 of 3)]
[im 1/64]
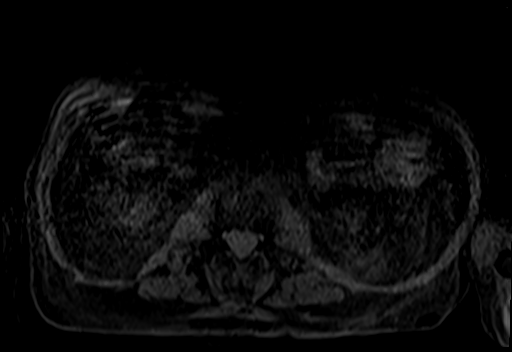
[im 32/64]
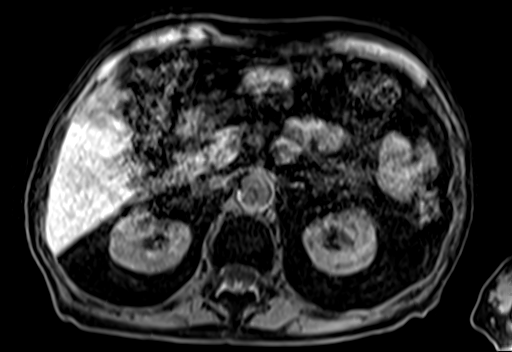
[im 64/64]
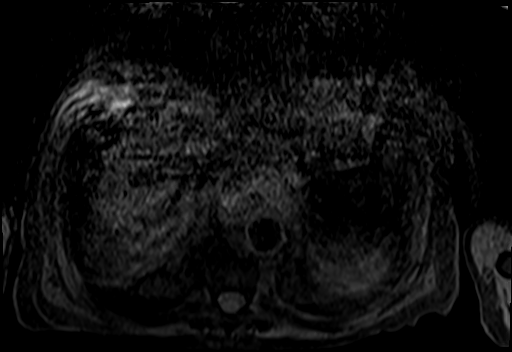

[Series 19: T1 dynamic fat-sat post-contrast · axial · 2.5mm · 0.74mm/px · z∈[+37,+194]mm · 3 of 64 slices shown (1 of 2)]
[im 1/64]
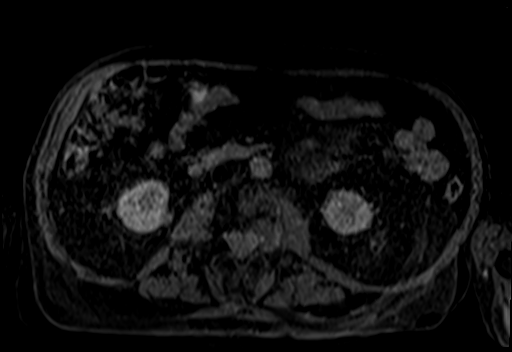
[im 32/64]
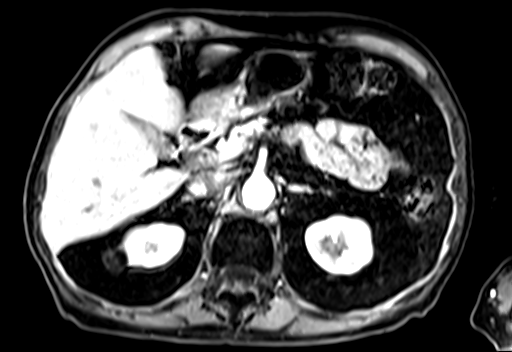
[im 64/64]
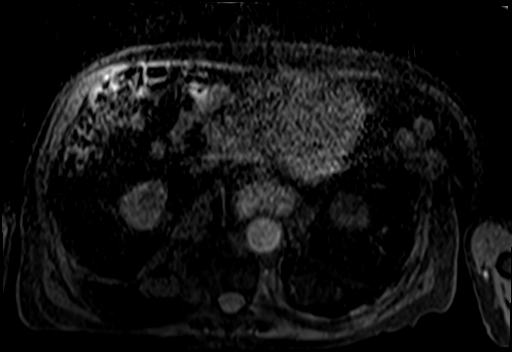

[Series 20: T1 dynamic fat-sat · axial · 2.5mm · 0.74mm/px · z∈[+37,+194]mm · 3 of 64 slices shown (2 of 3)]
[im 1/64]
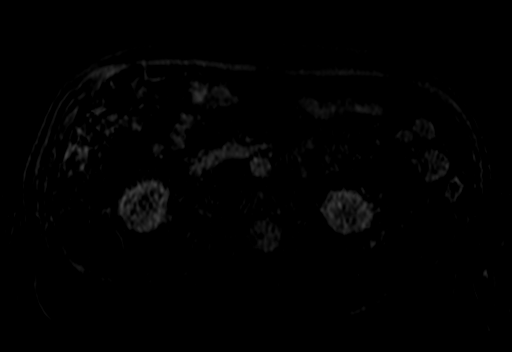
[im 32/64]
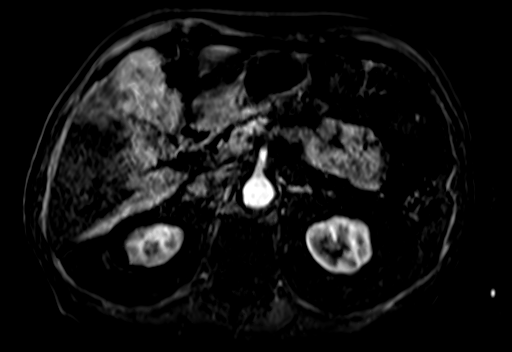
[im 64/64]
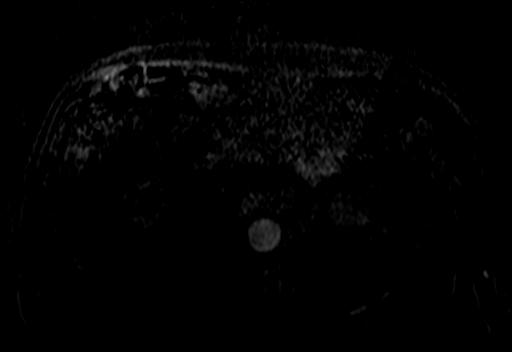

[Series 21: T1 dynamic fat-sat post-contrast · axial · 2.5mm · 0.74mm/px · z∈[+37,+194]mm · 3 of 64 slices shown (2 of 2)]
[im 1/64]
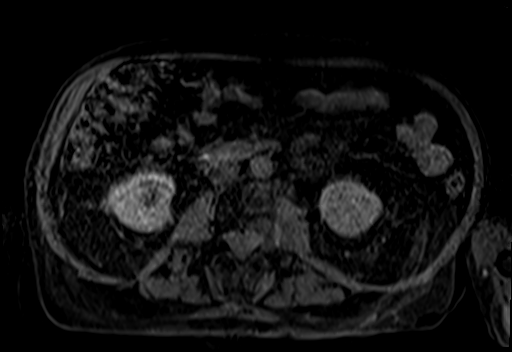
[im 32/64]
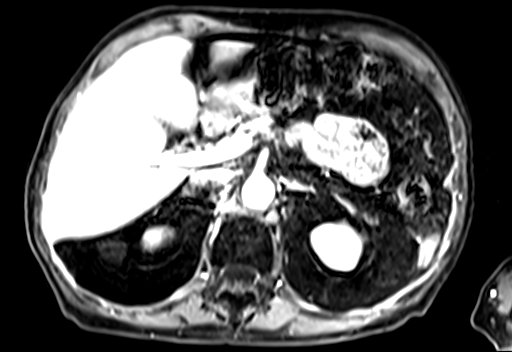
[im 64/64]
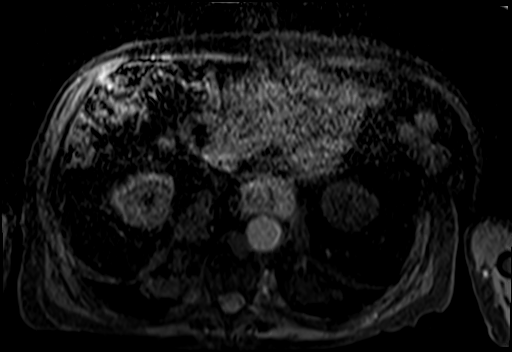

[Series 22: T1 dynamic fat-sat · axial · 2.5mm · 0.74mm/px · z∈[+37,+114]mm · 2 of 64 slices shown (3 of 3)]
[im 1/64]
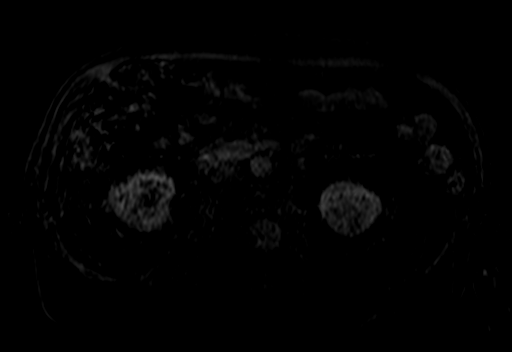
[im 32/64]
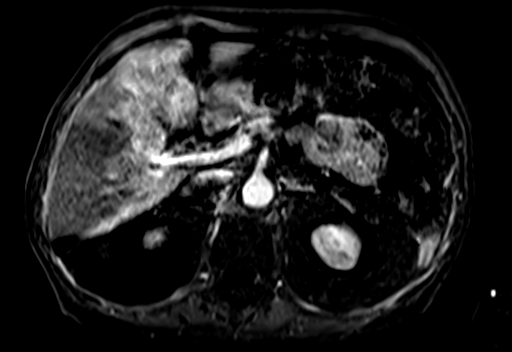

[28 of 48 positions shown; findings below may reference images not displayed]

FINDINGS: Despite efforts by the technologist and patient, mild motion
artifact is present on today's exam and could not be eliminated.
This reduces exam sensitivity and specificity.

Lower chest: No significant findings are seen within the visualized
lower chest.

Hepatobiliary: The liver appears unremarkable without focal lesion
or abnormal enhancement. No evidence of gallstones, gallbladder wall
thickening or biliary dilatation.

Pancreas: A mildly septated cystic mass within the uncinate process
does not appear significantly changed from the previous MRI,
measuring approximately 2.6 x 1.4 x 1.9 cm. This lesion demonstrates
no solid components or worrisome enhancement following contrast.
This lesion was not hypermetabolic on previous PET CTs. There was
focal hypermetabolism more distally in the pancreatic body and tail
on the original PET-CT without corresponding abnormality on this
study. There is no pancreatic ductal dilatation or surrounding
inflammation.

Spleen: Normal in size without focal abnormality.

Adrenals/Urinary Tract: Both adrenal glands appear normal. Multiple
small predominately Bosniak 1 renal cysts are again noted
bilaterally. No worrisome renal lesion or hydronephrosis.

Stomach/Bowel: No evidence of bowel wall thickening, distention or
surrounding inflammatory change.Moderate size hiatal hernia again
noted.

Vascular/Lymphatic: There are no enlarged abdominal lymph nodes.
There is diffuse atherosclerosis, better seen on CT. No focal
aneurysm or evidence of large vessel occlusion.

Other: No ascites or peritoneal nodularity.

Musculoskeletal: T12 compression fracture results in greater than
50% loss vertebral body height mild osseous retropulsion, grossly
stable from most recent PET CT. No worrisome osseous lesions are
seen.
IMPRESSION: 1. Mildly septated cystic mass within the uncinate process of the
pancreas is not significantly changed in size from baseline MRI 13
months ago. This lesion was not hypermetabolic on PET-CT, and
demonstrates no worrisome features. This is most likely an
incidental cystic pancreatic neoplasm. Given the patient's advanced
age and comorbidities, specific follow-up of this lesion is probably
not necessary. Its stability can be addressed during follow-up
imaging of the patient's lung cancer.
2. No metastases or acute findings demonstrated.
3. Grossly stable T12 compression fracture compared with recent
PET-CT.
4. Bilateral renal cysts.
